# Patient Record
Sex: Female | Born: 1985
Health system: Southern US, Community
[De-identification: ages and names within clinical notes are randomized; demographics above are authoritative.]

## PROBLEM LIST (undated history)

## (undated) DIAGNOSIS — G4700 Insomnia, unspecified: Secondary | ICD-10-CM

## (undated) DIAGNOSIS — Z124 Encounter for screening for malignant neoplasm of cervix: Secondary | ICD-10-CM

## (undated) DIAGNOSIS — J40 Bronchitis, not specified as acute or chronic: Secondary | ICD-10-CM

## (undated) DIAGNOSIS — K603 Anal fistula, unspecified: Secondary | ICD-10-CM

## (undated) DIAGNOSIS — F338 Other recurrent depressive disorders: Secondary | ICD-10-CM

## (undated) DIAGNOSIS — L709 Acne, unspecified: Secondary | ICD-10-CM

## (undated) DIAGNOSIS — J029 Acute pharyngitis, unspecified: Secondary | ICD-10-CM

## (undated) DIAGNOSIS — E785 Hyperlipidemia, unspecified: Secondary | ICD-10-CM

## (undated) DIAGNOSIS — Z973 Presence of spectacles and contact lenses: Secondary | ICD-10-CM

## (undated) DIAGNOSIS — N760 Acute vaginitis: Secondary | ICD-10-CM

## (undated) DIAGNOSIS — D649 Anemia, unspecified: Principal | ICD-10-CM

## (undated) DIAGNOSIS — N946 Dysmenorrhea, unspecified: Secondary | ICD-10-CM

## (undated) DIAGNOSIS — C801 Malignant (primary) neoplasm, unspecified: Secondary | ICD-10-CM

## (undated) DIAGNOSIS — G43909 Migraine, unspecified, not intractable, without status migrainosus: Secondary | ICD-10-CM

## (undated) DIAGNOSIS — Z0000 Encounter for general adult medical examination without abnormal findings: Secondary | ICD-10-CM

## (undated) DIAGNOSIS — E669 Obesity, unspecified: Secondary | ICD-10-CM

## (undated) DIAGNOSIS — T7840XA Allergy, unspecified, initial encounter: Secondary | ICD-10-CM

## (undated) DIAGNOSIS — C539 Malignant neoplasm of cervix uteri, unspecified: Secondary | ICD-10-CM

## (undated) DIAGNOSIS — F988 Other specified behavioral and emotional disorders with onset usually occurring in childhood and adolescence: Secondary | ICD-10-CM

## (undated) DIAGNOSIS — R1032 Left lower quadrant pain: Secondary | ICD-10-CM

## (undated) HISTORY — DX: Other recurrent depressive disorders: F33.8

## (undated) HISTORY — DX: Encounter for general adult medical examination without abnormal findings: Z00.00

## (undated) HISTORY — DX: Allergy, unspecified, initial encounter: T78.40XA

## (undated) HISTORY — DX: Hyperlipidemia, unspecified: E78.5

## (undated) HISTORY — DX: Anemia, unspecified: D64.9

## (undated) HISTORY — DX: Malignant (primary) neoplasm, unspecified: C80.1

## (undated) HISTORY — DX: Bronchitis, not specified as acute or chronic: J40

## (undated) HISTORY — DX: Acute vaginitis: N76.0

## (undated) HISTORY — DX: Encounter for screening for malignant neoplasm of cervix: Z12.4

## (undated) HISTORY — DX: Left lower quadrant pain: R10.32

## (undated) HISTORY — DX: Acne, unspecified: L70.9

## (undated) HISTORY — DX: Dysmenorrhea, unspecified: N94.6

## (undated) HISTORY — DX: Malignant neoplasm of cervix uteri, unspecified: C53.9

## (undated) HISTORY — DX: Migraine, unspecified, not intractable, without status migrainosus: G43.909

## (undated) HISTORY — DX: Insomnia, unspecified: G47.00

## (undated) HISTORY — DX: Acute pharyngitis, unspecified: J02.9

## (undated) HISTORY — DX: Obesity, unspecified: E66.9

## (undated) HISTORY — DX: Other specified behavioral and emotional disorders with onset usually occurring in childhood and adolescence: F98.8

## (undated) HISTORY — PX: NO PAST SURGERIES: SHX2092

---

## 2010-06-27 ENCOUNTER — Ambulatory Visit: Payer: Self-pay | Admitting: Family Medicine

## 2010-06-27 DIAGNOSIS — M549 Dorsalgia, unspecified: Secondary | ICD-10-CM | POA: Insufficient documentation

## 2010-06-27 DIAGNOSIS — M4120 Other idiopathic scoliosis, site unspecified: Secondary | ICD-10-CM | POA: Insufficient documentation

## 2010-06-27 DIAGNOSIS — R41840 Attention and concentration deficit: Secondary | ICD-10-CM | POA: Insufficient documentation

## 2010-06-27 DIAGNOSIS — E669 Obesity, unspecified: Secondary | ICD-10-CM | POA: Insufficient documentation

## 2010-06-29 ENCOUNTER — Ambulatory Visit: Payer: Self-pay | Admitting: Psychology

## 2010-07-03 ENCOUNTER — Ambulatory Visit: Payer: Self-pay | Admitting: Family Medicine

## 2010-07-03 LAB — CONVERTED CEMR LAB
Bilirubin Urine: NEGATIVE
Blood in Urine, dipstick: NEGATIVE
Glucose, Urine, Semiquant: NEGATIVE
Ketones, urine, test strip: NEGATIVE
Nitrite: NEGATIVE
Protein, U semiquant: NEGATIVE
Specific Gravity, Urine: 1.02
Urobilinogen, UA: 0.2
WBC Urine, dipstick: NEGATIVE
pH: 8

## 2010-07-04 LAB — CONVERTED CEMR LAB
ALT: 14 units/L (ref 0–35)
AST: 18 units/L (ref 0–37)
Albumin: 4.2 g/dL (ref 3.5–5.2)
Alkaline Phosphatase: 54 units/L (ref 39–117)
BUN: 11 mg/dL (ref 6–23)
Basophils Absolute: 0 10*3/uL (ref 0.0–0.1)
Basophils Relative: 0.5 % (ref 0.0–3.0)
Bilirubin, Direct: 0.1 mg/dL (ref 0.0–0.3)
CO2: 23 meq/L (ref 19–32)
Calcium: 9.4 mg/dL (ref 8.4–10.5)
Chloride: 106 meq/L (ref 96–112)
Cholesterol: 177 mg/dL (ref 0–200)
Creatinine, Ser: 0.8 mg/dL (ref 0.4–1.2)
Eosinophils Absolute: 0.1 10*3/uL (ref 0.0–0.7)
Eosinophils Relative: 1.7 % (ref 0.0–5.0)
GFR calc non Af Amer: 101.69 mL/min (ref >60)
Glucose, Bld: 79 mg/dL (ref 70–99)
HCT: 36.4 % (ref 36.0–46.0)
HDL: 48.2 mg/dL (ref >39.00)
Hemoglobin: 12.5 g/dL (ref 12.0–15.0)
LDL Cholesterol: 111 mg/dL — ABNORMAL HIGH (ref 0–99)
Lymphocytes Relative: 31.2 % (ref 12.0–46.0)
Lymphs Abs: 2.5 10*3/uL (ref 0.7–4.0)
MCHC: 34.4 g/dL (ref 30.0–36.0)
MCV: 85.8 fL (ref 78.0–100.0)
Monocytes Absolute: 0.5 10*3/uL (ref 0.1–1.0)
Monocytes Relative: 6.6 % (ref 3.0–12.0)
Neutro Abs: 4.8 10*3/uL (ref 1.4–7.7)
Neutrophils Relative %: 60 % (ref 43.0–77.0)
Platelets: 268 10*3/uL (ref 150.0–400.0)
Potassium: 4.3 meq/L (ref 3.5–5.1)
RBC: 4.24 M/uL (ref 3.87–5.11)
RDW: 13.8 % (ref 11.5–14.6)
Sodium: 138 meq/L (ref 135–145)
TSH: 1.85 microintl units/mL (ref 0.35–5.50)
Total Bilirubin: 0.5 mg/dL (ref 0.3–1.2)
Total CHOL/HDL Ratio: 4
Total Protein: 6.7 g/dL (ref 6.0–8.3)
Triglycerides: 91 mg/dL (ref 0.0–149.0)
VLDL: 18.2 mg/dL (ref 0.0–40.0)
WBC: 8 10*3/uL (ref 4.5–10.5)

## 2010-07-10 ENCOUNTER — Telehealth (INDEPENDENT_AMBULATORY_CARE_PROVIDER_SITE_OTHER): Payer: Self-pay | Admitting: *Deleted

## 2010-07-11 ENCOUNTER — Ambulatory Visit: Payer: Self-pay | Admitting: Family Medicine

## 2010-07-11 DIAGNOSIS — E782 Mixed hyperlipidemia: Secondary | ICD-10-CM | POA: Insufficient documentation

## 2010-07-11 DIAGNOSIS — M279 Disease of jaws, unspecified: Secondary | ICD-10-CM | POA: Insufficient documentation

## 2010-07-11 DIAGNOSIS — K589 Irritable bowel syndrome without diarrhea: Secondary | ICD-10-CM | POA: Insufficient documentation

## 2010-07-16 ENCOUNTER — Telehealth (INDEPENDENT_AMBULATORY_CARE_PROVIDER_SITE_OTHER): Payer: Self-pay | Admitting: *Deleted

## 2010-07-17 ENCOUNTER — Ambulatory Visit: Payer: Self-pay | Admitting: Family Medicine

## 2010-08-02 ENCOUNTER — Ambulatory Visit: Payer: Self-pay | Admitting: Family Medicine

## 2010-08-07 ENCOUNTER — Telehealth: Payer: Self-pay | Admitting: Family Medicine

## 2010-08-21 ENCOUNTER — Ambulatory Visit: Payer: Self-pay | Admitting: Family Medicine

## 2010-08-21 DIAGNOSIS — M2140 Flat foot [pes planus] (acquired), unspecified foot: Secondary | ICD-10-CM | POA: Insufficient documentation

## 2010-09-18 ENCOUNTER — Ambulatory Visit: Payer: Self-pay | Admitting: Family Medicine

## 2010-09-18 DIAGNOSIS — L708 Other acne: Secondary | ICD-10-CM | POA: Insufficient documentation

## 2010-09-19 ENCOUNTER — Telehealth: Payer: Self-pay | Admitting: Family Medicine

## 2010-09-20 ENCOUNTER — Encounter: Payer: Self-pay | Admitting: Family Medicine

## 2010-10-19 ENCOUNTER — Ambulatory Visit
Admission: RE | Admit: 2010-10-19 | Discharge: 2010-10-19 | Payer: Self-pay | Source: Home / Self Care | Attending: Family Medicine | Admitting: Family Medicine

## 2010-10-31 ENCOUNTER — Encounter: Payer: Self-pay | Admitting: Family Medicine

## 2010-11-13 NOTE — Assessment & Plan Note (Signed)
Summary: 3 week fup//ccm   Vital Signs:  Patient profile:   25 year old female Menstrual status:  regular Height:      64.5 inches (163.83 cm) Weight:      173 pounds (78.64 kg) O2 Sat:      99 % on Room air Temp:     97.9 degrees F (36.61 degrees C) oral Pulse rate:   103 / minute BP sitting:   124 / 88  (left arm) Cuff size:   regular  Vitals Entered By: [name] RMA (August 21, 2010 10:20 AM)  O2 Flow:  Room air CC: 3 week follow up/ CF Is Patient Diabetic? No   History of Present Illness: patient is a 25 year old Caucasian female in today for followup on her ADD. She is noting that the increase in finance from 40 to 50 dailydid  help, she felt more focused for a longer period. however she still notes the medication wears off and the early to mid afternoon before she is able to get her final classes or to any particular home. She's not had any concerns with the medication no recurrent headaches. She has no complaints of chest pain, shortness of breath, GI or GU concerns and is considering post secondary education so she would like to achieve better control using medications at this time. She noted a slight change in her appetite downward but does continue to get hungry and eat regularly. She notes a long history of scoliosis over her entire spine  and is sturggling with increased pain recently  Current Medications (verified): 1)  Vyvanse 50 Mg Caps (Lisdexamfetamine Dimesylate) .... Once Daily 2)  Fish Oil 1000 Mg Caps (Omega-3 Fatty Acids) .Marland Kitchen.. 1 Cap By Mouth Daily  Allergies (verified): 1)  ! Sulfa  Past History:  Past medical history reviewed for relevance to current acute and chronic problems. Social history (including risk factors) reviewed for relevance to current acute and chronic problems.  Social History: Reviewed history from 06/27/2010 and no changes required. Single Never Smoked, very infrequent light habit in past Alcohol use-no Drug  use-no Occupation: taking pre Occupational Therapy classes Regular exercise-yes, yoga Seat Belt use is regular No dietary restrictions  Review of Systems      See HPI  Physical Exam  General:  Well-developed,well-nourished,in no acute distress; alert,appropriate and cooperative throughout examination Mouth:  Oral mucosa and oropharynx without lesions or exudates.  Teeth in good repair. Neck:  No deformities, masses, or tenderness noted. Lungs:  Normal respiratory effort, chest expands symmetrically. Lungs are clear to auscultation, no crackles or wheezes. Heart:  Normal rate and regular rhythm. S1 and S2 normal without gallop, murmur, click, rub or other extra sounds. Abdomen:  Bowel sounds positive,abdomen soft and non-tender without masses, organomegaly or hernias noted. Msk:  No deformity or scoliosis noted of thoracic or lumbar spine.   Extremities:  No clubbing, cyanosis, edema, or deformity noted with normal full range of motion of all joints.   Psych:  Cognition and judgment appear intact. Alert and cooperative with normal attention span and concentration. No apparent delusions, illusions, hallucinations   Impression & Recommendations:  Problem # 1:  FLAT FOOT (ICD-734)  Orders: Misc. Referral (Misc. Ref) Referred to podiatry for further intervention due to her fallen arches and persistent pain, and valgus deformity at ankles  Problem # 2:  BACK PAIN (ICD-724.5)  Orders: T-Thoracic Spine 2 Views [phone]) T-Lumbar Spine Complete, 5 Views (13244WN) T-Cervicle Spine 2-3 Views (02725DG) History of scoliosis noted  will consider PT vs ortho referral after images available  Problem # 3:  ATTENTION OR CONCENTRATION DEFICIT (ICD-799.51) Increase Vyvanse to 60mg  qam and  may use Ritalin 5-10 mg by mouth at bedtime as needed  Complete Medication List: 1)  Fish Oil 1000 Mg Caps (Omega-3 fatty acids) .Marland Kitchen.. 1 cap by mouth daily 2)  Ritalin 5 Mg Tabs (Methylphenidate hcl) .Marland Kitchen.. 1-2  tabs by mouth q pm as needed add 3)  Vyvanse 60 Mg Caps (Lisdexamfetamine dimesylate) .Marland Kitchen.. 1 tab by mouth daily  Patient Instructions: 1)  Please schedule a follow-up appointment in 1 month.  2)  Call with any conerns  3)  drink 64 oz of clear fluids daily 4)  Release of Records from [facility] Prescriptions: VYVANSE 60 MG CAPS (LISDEXAMFETAMINE DIMESYLATE) 1 tab by mouth daily  #30 x 0   Entered and Authorized by:   Danise Edge MD   Signed by:   Danise Edge MD on 08/21/2010   Method used:   Print then Give to Patient   RxID:   [id number] RITALIN 5 MG TABS (METHYLPHENIDATE HCL) 1-2 tabs by mouth q pm as needed add  #20 x 0   Entered and Authorized by:   Danise Edge MD   Signed by:   Danise Edge MD on 08/21/2010   Method used:   Print then Give to Patient   RxID:   [id number]    Orders Added: 1)  T-Thoracic Spine 2 Views [72070TC] 2)  T-Lumbar Spine Complete, 5 Views [71110TC] 3)  T-Cervicle Spine 2-3 Views [72040TC] 4)  Misc. Referral [Misc. Ref] 5)  Est. Patient Level IV [66440]

## 2010-11-13 NOTE — Progress Notes (Signed)
Summary: please advise  Phone Note Call from Patient Call back at Home Phone [phone]   Caller: Patient Call For: Danise Edge MD Summary of Call: pt was seen on 07-07-2010. pt is concern med not effective anymore (vyvanse). Pt has ov sch for 08-06-2010 should she come in sooner ? Initial call taken by: Heron Sabins,  July 16, 2010 10:00 AM  Follow-up for Phone Call        Yes have her come in sooner. We need to increase by 10 mg intervals so we have to see her and give her some of the 30 mg tabs Follow-up by: Danise Edge MD,  July 16, 2010 4:24 PM  Additional Follow-up for Phone Call Additional follow up Details #1::        Left message on pts vm to call back and make an appt. for sooner Additional Follow-up by: [name] RMA,  July 16, 2010 4:55 PM

## 2010-11-13 NOTE — Assessment & Plan Note (Signed)
Summary: MEDICATION CONCERNS // RS   Vital Signs:  Patient profile:   25 year old female Menstrual status:  regular Height:      64.5 inches (163.83 cm) Weight:      182 pounds (82.73 kg) O2 Sat:      99 % on Room air Temp:     98.1 degrees F (36.72 degrees C) oral Pulse rate:   89 / minute BP sitting:   126 / 90  (left arm) Cuff size:   regular  Vitals Entered By: [name] RMA (July 17, 2010 12:29 PM)  O2 Flow:  Room air  Serial Vital Signs/Assessments:  Time      Position  BP       Pulse  Resp  Temp     By                     112/84   78                    Danise Edge MD  CC: Medication concers/ CF Is Patient Diabetic? No   History of Present Illness: Patient in today for reevaluation of her ADD. She initially noted a relatively good response to the Vyvanse helping her study and stay focused in class. She notes her stress level/anxiety and repression all feel better as she has been able to concentrate better. Then over the past few days she has had more trouble concentrating again and the med is wearing off earlier in the day. She notes she had one HA, like HA she has had in past, it responded to Aleve and has not recurred. Denies CP/SOB/GI or GU c/o. Did have some mild sense of her heart beating harder but no tachy or skipped beats. She does acknowledge some recent caffeine intake too  Current Medications (verified): 1)  Vyvanse 20 Mg Caps (Lisdexamfetamine Dimesylate) .Marland Kitchen.. 1 Cap By Mouth Daily  Allergies (verified): 1)  ! Sulfa  Past History:  Past medical history reviewed for relevance to current acute and chronic problems. Social history (including risk factors) reviewed for relevance to current acute and chronic problems.  Social History: Reviewed history from 06/27/2010 and no changes required. Single Never Smoked, very infrequent light habit in past Alcohol use-no Drug use-no Occupation: taking pre Occupational Therapy classes Regular exercise-yes,  yoga Seat Belt use is regular No dietary restrictions  Review of Systems      See HPI  Physical Exam  General:  Well-developed,well-nourished,in no acute distress; alert,appropriate and cooperative throughout examination Head:  Normocephalic and atraumatic without obvious abnormalities. No apparent alopecia or balding. Mouth:  Oral mucosa and oropharynx without lesions or exudates.  Teeth in good repair. Neck:  No deformities, masses, or tenderness noted. Lungs:  Normal respiratory effort, chest expands symmetrically. Lungs are clear to auscultation, no crackles or wheezes. Heart:  Normal rate and regular rhythm. S1 and S2 normal without gallop, murmur, click, rub or other extra sounds. Abdomen:  Bowel sounds positive,abdomen soft and non-tender without masses, organomegaly or hernias noted. Extremities:  No clubbing, cyanosis, edema, or deformity noted with normal full range of motion of all joints.   Psych:  Cognition and judgment appear intact. Alert and cooperative with normal attention span and concentration. No apparent delusions, illusions, hallucinations   Impression & Recommendations:  Problem # 1:  ATTENTION OR CONCENTRATION DEFICIT (ICD-799.51) Increase Vyvanse to 30mg  q am and report any concerning symptoms or incomplete response for dose adjustment.  Complete  Medication List: 1)  Vyvanse 30 Mg Caps (Lisdexamfetamine dimesylate) .Marland Kitchen.. 1 cap by mouth q am  Patient Instructions: 1)  Please schedule a follow-up appointment in 2 weeks. Or sooner if any concerns Prescriptions: VYVANSE 30 MG CAPS (LISDEXAMFETAMINE DIMESYLATE) 1 cap by mouth q am  #30 x 0   Entered and Authorized by:   Danise Edge MD   Signed by:   Danise Edge MD on 07/17/2010   Method used:   Print then Give to Patient   RxID:   [id number] VYVANSE 30 MG CAPS (LISDEXAMFETAMINE DIMESYLATE) 1 cap by mouth q am  #15 x 0   Entered and Authorized by:   Danise Edge MD   Signed by:   Danise Edge MD on  07/17/2010   Method used:   Print then Give to Patient   RxID:   [id number]

## 2010-11-13 NOTE — Assessment & Plan Note (Signed)
Summary: 1 month fup//ccm/pt rsc/cjr   Vital Signs:  Patient profile:   25 year old female Menstrual status:  regular Height:      64.5 inches (163.83 cm) Weight:      180 pounds (81.82 kg) O2 Sat:      99 % on Room air Temp:     98.0 degrees F (36.67 degrees C) oral Pulse rate:   80 / minute BP sitting:   122 / 82  (left arm) Cuff size:   regular  Vitals Entered By: [name] RMA (August 02, 2010 8:46 AM)  O2 Flow:  Room air CC: 1 month follow up on medication/ CF Is Patient Diabetic? No   History of Present Illness: Patient in today for reevaluation of her ADD. She is doing well and feels the Vyvanse dose increase helped somewhat but is still wearing off too early in the day. She is concentrating better but not as fully as she would like. She does acknowledge a bit more irritability on the med but very manageable. No CP/palp/SOB/GI or GU concerns. Did have a few HA but did get a mouthguard to use at bedtime a couple days ago and that has helped the HA tremendously. No HA this am  Preventive Screening-Counseling & Management  Alcohol-Tobacco     Smoking Status: never  Current Medications (verified): 1)  Vyvanse 30 Mg Caps (Lisdexamfetamine Dimesylate) .Marland Kitchen.. 1 Cap By Mouth Q Am  Allergies (verified): 1)  ! Sulfa  Past History:  Past medical history reviewed for relevance to current acute and chronic problems. Social history (including risk factors) reviewed for relevance to current acute and chronic problems.  Social History: Reviewed history from 06/27/2010 and no changes required. Single Never Smoked, very infrequent light habit in past Alcohol use-no Drug use-no Occupation: taking pre Occupational Therapy classes Regular exercise-yes, yoga Seat Belt use is regular No dietary restrictions  Review of Systems      See HPI  Physical Exam  General:  Well-developed,well-nourished,in no acute distress; alert,appropriate and cooperative throughout  examination Head:  Normocephalic and atraumatic without obvious abnormalities. No apparent alopecia or balding. Mouth:  Oral mucosa and oropharynx without lesions or exudates.  Teeth in good repair. Neck:  No deformities, masses, or tenderness noted. Lungs:  Normal respiratory effort, chest expands symmetrically. Lungs are clear to auscultation, no crackles or wheezes. Heart:  Normal rate and regular rhythm. S1 and S2 normal without gallop, murmur, click, rub or other extra sounds. Abdomen:  Bowel sounds positive,abdomen soft and non-tender without masses, organomegaly or hernias noted. Psych:  Cognition and judgment appear intact. Alert and cooperative with normal attention span and concentration. No apparent delusions, illusions, hallucinations   Impression & Recommendations:  Problem # 1:  ATTENTION OR CONCENTRATION DEFICIT (ICD-799.51) Will have her increase her Vyvanse to 40mg  qam by taking 2 of the 20mg  tabs daily until early next week, if incomplete response will increase to 50mg , if good response will write a script for 40mg . Call with any concerns  Problem # 2:  MIXED HYPERLIPIDEMIA (ICD-272.2) Has started fish oil caps daily  Complete Medication List: 1)  Vyvanse 30 Mg Caps (Lisdexamfetamine dimesylate) .Marland Kitchen.. 1 cap by mouth q am 2)  Fish Oil 1000 Mg Caps (Omega-3 fatty acids) .Marland Kitchen.. 1 cap by mouth daily  Patient Instructions: 1)  Please schedule a follow-up appointment in 2 to3 weeks.  2)  Take 2 of the 20mg  Vyvanse daily for next 5 days then call office to report if there was  an adequate response or not. So we can adjust dosing.  3)  Call with any concerns.   Orders Added: 1)  Est. Patient Level III [30865]

## 2010-11-13 NOTE — Assessment & Plan Note (Signed)
Summary: 2 wk rov/njr   Vital Signs:  Patient profile:   25 year old female Menstrual status:  regular Height:      64.5 inches (163.83 cm) Weight:      184 pounds (83.64 kg) O2 Sat:      98 % on Room air Temp:     98.5 degrees F (36.94 degrees C) oral Pulse rate:   89 / minute BP sitting:   108 / 78  (left arm) Cuff size:   regular  Vitals Entered By: [name] RMA (July 11, 2010 8:54 AM)  O2 Flow:  Room air CC: 2 week follow up/ CF Is Patient Diabetic? No   History of Present Illness: Patient in today for reevaluation of her Attention Deficit issues. Seen by Dr Dellia Cloud and evaluated for ADD, Anxiety and Depression. Testing did confirm ADD and she would like to start meds. She continues to struggle in school with difficulty concentrating and did very poorly on a biology test recently. She had a long discussion with Dr Dellia Cloud about her anxiety and low mood and they decided they may be related to her ADD and difficulty at school. She would like to treat one and see how the others do. She is also noting she is grinding her teeth at night and waking up with discomfort in her bilateral temporal mandibular joint. She denies any dental pain and has not seen a dentist recently. She is having some irritable bowel symptoms wavering from some mild constipation for some mild loose stool at times. She denies fevers, chills, nausea, vomiting, anorexia, bloody or tarry stool. Denies any recent illness, chest pain, palpitations, shortness or breath, headaches  Current Medications (verified): 1)  None  Allergies (verified): 1)  ! Sulfa  Past History:  Past medical history reviewed for relevance to current acute and chronic problems. Social history (including risk factors) reviewed for relevance to current acute and chronic problems.  Social History: Reviewed history from 06/27/2010 and no changes required. Single Never Smoked, very infrequent light habit in past Alcohol  use-no Drug use-no Occupation: taking pre Occupational Therapy classes Regular exercise-yes, yoga Seat Belt use is regular No dietary restrictions  Review of Systems      See HPI  Physical Exam  General:  Well-developed,well-nourished,in no acute distress; alert,appropriate and cooperative throughout examination Head:  Normocephalic and atraumatic without obvious abnormalities. No apparent alopecia or balding. Mouth:  Oral mucosa and oropharynx without lesions or exudates.  Teeth in good repair. Neck:  No deformities, masses, or tenderness noted. Lungs:  Normal respiratory effort, chest expands symmetrically. Lungs are clear to auscultation, no crackles or wheezes. Heart:  Normal rate and regular rhythm. S1 and S2 normal without gallop, murmur, click, rub or other extra sounds. Abdomen:  Bowel sounds positive,abdomen soft and non-tender without masses, organomegaly or hernias noted. Extremities:  No clubbing, cyanosis, edema, or deformity noted  Cervical Nodes:  No lymphadenopathy noted Psych:  Cognition and judgment appear intact. Alert and cooperative with normal attention span and concentration. No apparent delusions, illusions, hallucinations   Impression & Recommendations:  Problem # 1:  ATTENTION OR CONCENTRATION DEFICIT (ICD-799.51) Vyvanse 20mg  by mouth once daily is given, warned regarding side effects will call if any concerns, will reevaluate in 3 weeks  Problem # 2:  BACK PAIN (ICD-724.5) Encouraged increased exercise and decrease by mouth intake  Problem # 3:  MIXED HYPERLIPIDEMIA (ICD-272.2) Mild elevation in LDL cholesterol encouraged avoid trans fats, minimize saturated fats, add Fiber supplement and increae  exercise. Repeat FLP in 1 year  Problem # 4:  IBS (ICD-564.1) Consider a yogurt with fiber supplement daily and report worsening symptoms  Problem # 5:  JAW PAIN (ICD-526.9) Likely related to grinding her teeth at bedtime due to stress, if persists recommend  mouth guard and dental evaluation. Use Aspercreme as needed over external TMJ for pain relief  Complete Medication List: 1)  Vyvanse 20 Mg Caps (Lisdexamfetamine dimesylate) .Marland Kitchen.. 1 cap by mouth daily  Patient Instructions: 1)  Please schedule a follow-up appointment in 3 weeks.  2)  Avoid trans fats, minimize saturated fats, increase exercise 3)  Report any concerns regarding new meds prior to visit 4)  Release of Record recent evaluation and visit with Dr Dellia Cloud Prescriptions: VYVANSE 20 MG CAPS (LISDEXAMFETAMINE DIMESYLATE) 1 cap by mouth daily  #30 x 0   Entered and Authorized by:   Danise Edge MD   Signed by:   Danise Edge MD on 07/11/2010   Method used:   Print then Give to Patient   RxID:   [id number]

## 2010-11-13 NOTE — Letter (Signed)
Summary: Medical Review Letter  Letter   Imported By: Georga Bora 09/20/2010 08:29:31  _____________________________________________________________________  External Attachment:    Type:   Image     Comment:   External Document  Appended Document: Medical Review Letter letter faxed 12.7.11

## 2010-11-13 NOTE — Assessment & Plan Note (Signed)
Summary: NEW PT TO ESTABLISH//SLM   Vital Signs:  Patient profile:   25 year old female Menstrual status:  regular LMP:     06/20/2010 Height:      64.5 inches (163.83 cm) Weight:      181 pounds (82.27 kg) BMI:     30.70 O2 Sat:      98 % on Room air Temp:     98.6 degrees F (37.00 degrees C) oral Pulse (ortho):   76 / minute BP sitting:   130 / 90  (left arm) Cuff size:   regular  Vitals Entered By: [name] RMA (June 27, 2010 2:33 PM)  O2 Flow:  Room air CC: Establish new patient/ Possibility of ADD/ Hep A vaccine?/CF Is Patient Diabetic? No LMP (date): 06/20/2010     Menstrual Status regular Enter LMP: 06/20/2010   History of Present Illness: Patient is in for new patient examination. 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 She's had no recent acute illness but needs to establish care and discuss immunizations. She is concerned about what she believes is a history of a ADD. She had difficulty getting focused, she notes she's always had trouble finishing tasks both at home and at school. She finds when she sits up she fidgets frequently and has trouble concentrating. Has always taken longer to finish assignments that she felt was appropriate. In examining her family history she believes her father also start now and is presently still having trouble caring she is requesting evaluation to confirm the diagnosis. No fevers, chills, chest pain, palpitations, shortness of breath, GI or GU complaints at this time  Preventive Screening-Counseling & Management  Alcohol-Tobacco     Smoking Status: never  Caffeine-Diet-Exercise     Does Patient Exercise: yes      Drug Use:  no.    Current Medications (verified): 1)  None  Allergies (verified): 1)  ! Sulfa  Past History:  Past Surgical History: Denies surgical history  Family History: Father: 26, allergies, asthma, renal lithiasis, knee  injuries, depression/anxiety, IBS Mother: 57, A&W, hyperlipidemia Siblings:  Sister: 13, anxiety/depression, Migraine HA, GI upset/IBS Brother: 50: depression' allergies Sister: 13, anemia, fatigue MGM: 73, derpession/anxiety, ADD, HTN MGF: deceased, step recently genetic MGF died young unclear causes PGM: deceased@59 , cardiomyopathy, depression PGF: 53, prostate disease, CAD Children:None  Social History: Single Never Smoked, very infrequent light habit in past Alcohol use-no Drug use-no Occupation: taking pre Occupational Therapy classes Regular exercise-yes, yoga Seat Belt use is regular No dietary restrictions Smoking Status:  never Drug Use:  no Occupation:  employed Does Patient Exercise:  yes  Review of Systems       The patient complains of depression.  The patient denies anorexia, fever, weight loss, weight gain, vision loss, decreased hearing, hoarseness, chest pain, syncope, dyspnea on exertion, peripheral edema, prolonged cough, headaches, hemoptysis, abdominal pain, melena, hematochezia, severe indigestion/heartburn, hematuria, incontinence, muscle weakness, suspicious skin lesions, transient blindness, difficulty walking, unusual weight change, abnormal bleeding, and enlarged lymph nodes.         Flu Vaccine Consent Questions     Do you have a history of severe allergic reactions to this vaccine? no    Any prior history of allergic reactions to egg and/or gelatin? no    Do you have a sensitivity to the preservative Thimersol? no    Do you have a past history of Guillan-Barre Syndrome? no    Do you currently have  an acute febrile illness? no    Have you ever had a severe reaction to latex? no    Vaccine information given and explained to patient? yes    Are you currently pregnant? no    Lot Number:[id number]   Exp Date:04/13/2011   Site Given  Left Deltoid IM [name] RMA  June 27, 2010 3:49 PM   Physical Exam  General:   Well-developed,well-nourished,in no acute distress; alert,appropriate and cooperative throughout examination Head:  Normocephalic and atraumatic without obvious abnormalities. No apparent alopecia or balding. Eyes:  No corneal or conjunctival inflammation noted. EOMI.  Ears:  External ear exam shows no significant lesions or deformities.  Otoscopic examination reveals clear canals, tympanic membranes are intact bilaterally without bulging, retraction, inflammation or discharge. Hearing is grossly normal bilaterally. Nose:  External nasal examination shows no deformity or inflammation. Nasal mucosa are pink and moist without lesions or exudates. Mouth:  Oral mucosa and oropharynx without lesions or exudates.  Teeth in good repair. Neck:  No deformities, masses, or tenderness noted. Lungs:  Normal respiratory effort, chest expands symmetrically. Lungs are clear to auscultation, no crackles or wheezes. Heart:  Normal rate and regular rhythm. S1 and S2 normal without gallop, murmur, click, rub or other extra sounds. Abdomen:  Bowel sounds positive,abdomen soft and non-tender without masses, organomegaly or hernias noted. Msk:  No deformity or scoliosis noted of thoracic or lumbar spine.   Pulses:  R and L carotid,radial,femoral,dorsalis pedis and posterior tibial pulses are full and equal bilaterally Extremities:  No clubbing, cyanosis, edema, or deformity noted with normal full range of motion of all joints.   Neurologic:  No cranial nerve deficits noted. Station and gait are normal. Plantar reflexes are down-going bilaterally. DTRs are symmetrical throughout. Sensory, motor and coordinative functions appear intact. Skin:  Intact without suspicious lesions or rashes Cervical Nodes:  No lymphadenopathy noted Psych:  Cognition and judgment appear intact. Alert and cooperative with normal attention span and concentration. No apparent delusions, illusions, hallucinations   Impression &  Recommendations:  Problem # 1:  BACK PAIN (ICD-724.5) Mild may use Aleve as needed, maintain good posture and exercise  Problem # 2:  OVERWEIGHT (ICD-278.02) Check fasting labs  Problem # 3:  Preventive Health Care (ICD-V70.0) Discussed immunizations, will consider Gardisil and Hep A shots. Agrees to Flu shot  Problem # 4:  ATTENTION OR CONCENTRATION DEFICIT (ICD-799.51) Will refer for evaluation and then initiate meds if appropriate  Other Orders: Admin 1st Vaccine ([address]) Flu Vaccine 56yrs + ([address])  Patient Instructions: 1)  Please schedule a follow-up appointment in 2 weeks.  2)  BMP prior to visit, ICD-9: v70.0 for all 3)  Hepatic Panel prior to visit ICD-9:  4)  Lipid panel prior to visit ICD-9 :  5)  TSH prior to visit ICD-9 :  6)  CBC w/ Diff prior to visit ICD-9 :   Preventive Care Screening  PPD:    Date:  06/14/2010    Results:  historical   Last Tetanus Booster:    Date:  04/13/2010    Results:  Historical

## 2010-11-13 NOTE — Assessment & Plan Note (Signed)
Summary: fu visit questions about meds/dt   Vital Signs:  Patient profile:   25 year old female Menstrual status:  regular Weight:      170.50 pounds O2 Sat:      100 % on Room air Temp:     98.5 degrees F oral Pulse rate:   76 / minute Resp:     76 per minute BP sitting:   126 / 87  (right arm)  O2 Flow:  Room air   History of Present Illness: Patient is here today for reevaluation of ADD. She reports she has had a good response to the increased Vyvanse but it still does not last long enough. When she takes it she is able to focus well but it wears off still in 3-4 hours. She does not experience any CP/palp/anorexia. She tried the Ritalin short acting in the afternoon but she feels that actually kept her up at night.  She is also having ongoing difficulty with facial acne and would like to see a Dermatologist to have it further evaluated. She has tried numberous OTC treatments in the past without benefit. Did not go to have her xrays due to difficulties with school. Her low back pain is present and tolerable and she will go for the xrays later. No incontinence/GI or GU c/o. She has not been able to proceed with Podiatry due to her insurance as well. Her feet still continue to bother her. No recent illness/f/c/malaise.  Allergies: 1)  ! Sulfa  Past History:  Past medical history reviewed for relevance to current acute and chronic problems. Social history (including risk factors) reviewed for relevance to current acute and chronic problems.  Social History: Reviewed history from 06/27/2010 and no changes required. Single Never Smoked, very infrequent light habit in past Alcohol use-no Drug use-no Occupation: taking pre Occupational Therapy classes Regular exercise-yes, yoga Seat Belt use is regular No dietary restrictions  Review of Systems      See HPI  Physical Exam  General:  Well-developed,well-nourished,in no acute distress; alert,appropriate and cooperative  throughout examination Head:  Normocephalic and atraumatic without obvious abnormalities. No apparent alopecia or balding. Mouth:  Oral mucosa and oropharynx without lesions or exudates.  Teeth in good repair. Lungs:  Normal respiratory effort, chest expands symmetrically. Lungs are clear to auscultation, no crackles or wheezes. Heart:  Normal rate and regular rhythm. S1 and S2 normal without gallop, murmur, click, rub or other extra sounds. Abdomen:  Bowel sounds positive,abdomen soft and non-tender without masses, organomegaly or hernias noted. Extremities:  No clubbing, cyanosis, edema, or deformity noted    Cervical Nodes:  No lymphadenopathy noted Psych:  Cognition and judgment appear intact. Alert and cooperative with normal attention span and concentration. No apparent delusions, illusions, hallucinations   Impression & Recommendations:  Problem # 1:  ATTENTION OR CONCENTRATION DEFICIT (ICD-799.51) Will try to split the Vyvanse dosing to 40mg  in am and 30mg  q noon, patient will call if inadequate response or any other concerns  Problem # 2:  FLAT FOOT (ICD-734) Patient is going to check with her insurance to see what they are willing to cover and let us know if she wants to proceed with referral. Encouraged her to try some OTC Dr Margart Sickles inserts in the mean time  Problem # 3:  BACK PAIN (ICD-724.5) Improved somewhat, may proceed with xrays later. Encouraged her to remain active and stretch daily  Problem # 4:  ACNE VULGARIS (ICD-706.1) Would like referral to Dermatology, willl call her  insurance to figure out who she would like a referral to.  Complete Medication List: 1)  Fish Oil 1000 Mg Caps (Omega-3 fatty acids) .Marland Kitchen.. 1 cap by mouth daily 2)  Vyvanse 40 Mg Caps (Lisdexamfetamine dimesylate) .Marland Kitchen.. 1 cap by mouth qam 3)  Vyvanse 30 Mg Caps (Lisdexamfetamine dimesylate) .Marland Kitchen.. 1 cap by mouth daily at noon  Patient Instructions: 1)  Please schedule a follow-up appointment in 1  month.  2)  Call with name of Dermatologist and Podiatrist your insurance approves of Prescriptions: VYVANSE 30 MG CAPS (LISDEXAMFETAMINE DIMESYLATE) 1 cap by mouth daily at noon  #30 x 0   Entered and Authorized by:   Danise Edge MD   Signed by:   Danise Edge MD on 09/18/2010   Method used:   Print then Give to Patient   RxID:   [id number] VYVANSE 40 MG CAPS (LISDEXAMFETAMINE DIMESYLATE) 1 cap by mouth qam  #30 x 0   Entered and Authorized by:   Danise Edge MD   Signed by:   Danise Edge MD on 09/18/2010   Method used:   Print then Give to Patient   RxID:   [id number]    Orders Added: 1)  Est. Patient Level IV [84696]

## 2010-11-13 NOTE — Progress Notes (Signed)
Summary: FYI  Phone Note Call from Patient   Summary of Call: Patient has called several times wanting to know if we have received information from Dr Levie Heritage office? Patient would like to get started on her RX that he discussed with her?  I left a message on pts vm stating we still have not received anything from that Drs office. Initial call taken by: [name] RMA,  July 10, 2010 8:44 AM

## 2010-11-13 NOTE — Progress Notes (Signed)
Summary: Vyvanse increase  Phone Note Call from Patient   Summary of Call: Pt called and stated she increased the Vyvanse to 1 tab of 20mg  two times a day for two days and felt better. Pt states she took one at 9 am yesterday and by 2:30 she felt as if she has never taken anything. Pt would like to know if she should go to 50mg  daily? Initial call taken by: [name] RMA,  August 07, 2010 10:41 AM  Follow-up for Phone Call        Vyvanse 50mg  by mouth qam, give her 30#, no refills Follow-up by: Danise Edge MD,  August 07, 2010 4:43 PM    New/Updated Medications: VYVANSE 50 MG CAPS (LISDEXAMFETAMINE DIMESYLATE) once daily Prescriptions: VYVANSE 50 MG CAPS (LISDEXAMFETAMINE DIMESYLATE) once daily  #30 x 0   Entered by:   [name] RMA   Authorized by:   Danise Edge MD   Signed by:   [name] RMA on 08/07/2010   Method used:   Telephoned to ...       [id number] (retail)       [address]       [place], [place]  [address]       Ph: [phone] or [phone]       Fax: [phone]   RxID:   [id number]   Appended Document: Vyvanse increase RX has to be picked up. Pharmacy and patient informed.   Clinical Lists Changes  Medications: Rx of VYVANSE 50 MG CAPS (LISDEXAMFETAMINE DIMESYLATE) once daily;  #30 x 0;  Signed;  Entered by: [name] RMA;  Authorized by: Danise Edge MD;  Method used: Print then Give to Patient    Prescriptions: VYVANSE 50 MG CAPS (LISDEXAMFETAMINE DIMESYLATE) once daily  #30 x 0   Entered by:   [name] RMA   Authorized by:   Danise Edge MD   Signed by:   [name] RMA on 08/07/2010   Method used:   Print then Give to Patient   RxID:   [id number]

## 2010-11-15 ENCOUNTER — Encounter: Payer: Self-pay | Admitting: Family Medicine

## 2010-11-15 NOTE — Assessment & Plan Note (Signed)
Summary: 48month f/u/vfw   Vital Signs:  Patient profile:   25 year old female Menstrual status:  regular Height:      64.5 inches (163.83 cm) Weight:      168.50 pounds (76.59 kg) O2 Sat:      99 % on Room air Temp:     97.6 degrees F (36.44 degrees C) oral Pulse rate:   79 / minute BP sitting:   118 / 81  (right arm) Cuff size:   regular  Vitals Entered By: [name] RMA (October 19, 2010 11:42 AM)  O2 Flow:  Room air CC: 1 month follow up/ pt needs refill on Vyvanse/  CF Is Patient Diabetic? No   History of Present Illness: Patient is a 25 yo Caucasian female in today for reevaluation of ADD and multiple other medical problems. she is pleased with her response to the split dosing of bypass. She feels she has less side effects on the split dosing and has 5 she can concentrate for longer throughout the day. She takes 40 mg in the morning and 30 mg midday she does note on occasion she is brought to take Advil 2 in the afternoon and then she does experience some insomnia but in general if she takes the dosing in the morning and midday she does not sure what the side effect. She denies any headaches, chest pain, palpitations, shortness or breath or other concerning symptoms. But her bowels are presently moving better she. She has increased the fiber and fluids in her diet without caffeine and feels that has helped her out quite a bit. She continues to struggle with chronic back pain most notably in the thoracic spine but also up in the neck and some radicular symptoms in the right upper extremity are noted at times. She notes with some weight loss and increased exercise the pain tends to improve but at present has been bothering her. Her feet continue to be somewhat painful but she has not chosen to see orthopedics or podiatry at the match her pain as the pain is tolerable. She has yet to set up her appointment with allergy for her acne but notes the symptoms are stable and not worsening.  No fevers, chills, febrile or recent illness.  Current Medications (verified): 1)  Fish Oil 1000 Mg Caps (Omega-3 Fatty Acids) .Marland Kitchen.. 1 Cap By Mouth Daily 2)  Vyvanse 40 Mg Caps (Lisdexamfetamine Dimesylate) .Marland Kitchen.. 1 Cap By Mouth Qam 3)  Vyvanse 30 Mg Caps (Lisdexamfetamine Dimesylate) .Marland Kitchen.. 1 Cap By Mouth Daily At Noon  Allergies (verified): 1)  ! Sulfa  Past History:  Past medical history reviewed for relevance to current acute and chronic problems. Social history (including risk factors) reviewed for relevance to current acute and chronic problems.  Social History: Reviewed history from 06/27/2010 and no changes required. Single Never Smoked, very infrequent light habit in past Alcohol use-no Drug use-no Occupation: taking pre Occupational Therapy classes Regular exercise-yes, yoga Seat Belt use is regular No dietary restrictions  Review of Systems      See HPI  Physical Exam  General:  Well-developed,well-nourished,in no acute distress; alert,appropriate and cooperative throughout examination Head:  Normocephalic and atraumatic without obvious abnormalities. No apparent alopecia or balding. Mouth:  Oral mucosa and oropharynx without lesions or exudates.  Teeth in good repair. Lungs:  Normal respiratory effort, chest expands symmetrically. Lungs are clear to auscultation, no crackles or wheezes. Heart:  Normal rate and regular rhythm. S1 and S2 normal without gallop,  murmur, click, rub or other extra sounds. Abdomen:  Bowel sounds positive,abdomen soft and non-tender without masses, organomegaly or hernias noted. Extremities:  No clubbing, cyanosis, edema, or deformity noted with normal full range of motion of all joints.   Psych:  Cognition and judgment appear intact. Alert and cooperative with normal attention span and concentration. No apparent delusions, illusions, hallucinations   Impression & Recommendations:  Problem # 1:  ATTENTION OR CONCENTRATION DEFICIT  (ICD-799.51) Symptoms greatly improved on split dosing of Vyvanse. No concerning side effects will cont 40mg  in am and 30 mg in midday. Patient will call with any concerns when she returns back to college next week.  Problem # 2:  ACNE VULGARIS (ICD-706.1) Patient has not proceeded with dermatology to date but does believe she will go in the near future. In the interim use Cetaphil soap and Witch Hazel Astringent daily  Problem # 3:  IBS (ICD-564.1) Symptoms improved some with increased fiber and fluid in diet, has cut out caffeine as well. Moving bowels with regularity now.  Problem # 4:  FLAT FOOT (ICD-734) No increased pain at present but she still believes she will seek evaluation from a podiatrist in the future. Consider otc inserts in the interim  Problem # 5:  BACK PAIN (ICD-724.5)  Orders: T-Cervicle Spine 2-3 Views (23762GB) T-Lumbar Spine 2 Views (72100TC) T-Thoracic Spine 2 Views ([phone]) Did not go for the xrays last time we ordered them and continues to have some pain so will try an go today. Encouraged moist heat and gentle stretching of neck and shoulders daily as instructed  Complete Medication List: 1)  Fish Oil 1000 Mg Caps (Omega-3 fatty acids) .Marland Kitchen.. 1 cap by mouth daily 2)  Vyvanse 40 Mg Caps (Lisdexamfetamine dimesylate) .Marland Kitchen.. 1 cap by mouth qam 3)  Vyvanse 30 Mg Caps (Lisdexamfetamine dimesylate) .Marland Kitchen.. 1 cap by mouth daily at noon  Patient Instructions: 1)  Please schedule a follow-up appointment in 2 months.  2)  Call if any concerns 3)  Moist heat and gentle stretching of neck daily Prescriptions: VYVANSE 30 MG CAPS (LISDEXAMFETAMINE DIMESYLATE) 1 cap by mouth daily at noon  #30 x 0   Entered and Authorized by:   Danise Edge MD   Signed by:   Danise Edge MD on 10/19/2010   Method used:   Print then Give to Patient   RxID:   [id number] VYVANSE 40 MG CAPS (LISDEXAMFETAMINE DIMESYLATE) 1 cap by mouth qam  #30 x 0   Entered and Authorized by:   Danise Edge MD   Signed by:   Danise Edge MD on 10/19/2010   Method used:   Print then Give to Patient   RxID:   [id number]    Orders Added: 1)  T-Cervicle Spine 2-3 Views [72040TC] 2)  T-Lumbar Spine 2 Views [72100TC] 3)  T-Thoracic Spine 2 Views [72070TC] 4)  Est. Patient Level IV [16967]

## 2010-11-15 NOTE — Progress Notes (Signed)
  Phone Note Call from Patient   Caller: Patient Call For: Danise Edge MD Summary of Call: The insurance company needs a  letter of explanation for the two different strengths of meds the patient  has to take . The letter is for Medical Review I.D number [id number]. Fax # [phone] or [phone]. You may call phone # [phone] or [phone]. Initial call taken by: Georga Bora,  September 19, 2010 2:00 PM  Follow-up for Phone Call        Letter completed, please fax Follow-up by: Danise Edge MD,  September 19, 2010 3:32 PM  Additional Follow-up for Phone Call Additional follow up Details #1::        Letter faxed December 7,2011.  Additional Follow-up by: Georga Bora,  September 24, 2010 8:30 AM

## 2010-11-26 ENCOUNTER — Telehealth: Payer: Self-pay | Admitting: Family Medicine

## 2010-11-27 ENCOUNTER — Encounter: Payer: Self-pay | Admitting: Family Medicine

## 2010-11-27 ENCOUNTER — Ambulatory Visit (INDEPENDENT_AMBULATORY_CARE_PROVIDER_SITE_OTHER): Payer: Commercial Indemnity | Admitting: Family Medicine

## 2010-11-27 DIAGNOSIS — J209 Acute bronchitis, unspecified: Secondary | ICD-10-CM | POA: Insufficient documentation

## 2010-11-27 DIAGNOSIS — R41840 Attention and concentration deficit: Secondary | ICD-10-CM

## 2010-11-28 ENCOUNTER — Encounter: Payer: Self-pay | Admitting: Family Medicine

## 2010-12-05 ENCOUNTER — Encounter: Payer: Self-pay | Admitting: Family Medicine

## 2010-12-05 ENCOUNTER — Telehealth (INDEPENDENT_AMBULATORY_CARE_PROVIDER_SITE_OTHER): Payer: Self-pay | Admitting: *Deleted

## 2010-12-05 NOTE — Assessment & Plan Note (Signed)
Summary: congestion-cough/dt   Vital Signs:  Patient profile:   25 year old female Menstrual status:  regular Height:      64.5 inches Weight:      159 pounds Temp:     98.2 degrees F oral BP sitting:   112 / 76  (right arm) Cuff size:   regular  Vitals Entered By: Francee Piccolo CMA Duncan Dull) (November 27, 2010 3:27 PM) CC: congestion, ? fever yesterday...SP Is Patient Diabetic? No   History of Present Illness: patient is a 25 year old Caucasian female in today for evaluation of worsening congestion. Patient notes that for several weeks now she has been feeling somewhat congested and increasingly tired. She reports trying to get to sleep he had transient felt she was doing okay until about 3 days ago. 3 days ago she had significant increase in her nasal congestion with yellowish, rhinorrhea, malaise, fatigue, myalgias, anorexia, fevers, chills, cough, sore throat, ear pain and mild chest congestion. 5 days ago she had onset of increase in loose stool as well. Denies any bloody or tarry stool but has had some nausea. Significant amount of postnasal drip but denies any obvious wheezing or shortness of breath. She has tried NyQuil and DayQuil without any improvement. She noticed that eating and drinking well and is struggling with some dehydration as well. Mild headache as noted. Denies any urinary symptoms such as dysuria or decreased urinary flow.  Current Medications (verified): 1)  Fish Oil 1000 Mg Caps (Omega-3 Fatty Acids) .Marland Kitchen.. 1 Cap By Mouth Daily 2)  Vyvanse 40 Mg Caps (Lisdexamfetamine Dimesylate) .Marland Kitchen.. 1 Cap By Mouth Qam 3)  Vyvanse 30 Mg Caps (Lisdexamfetamine Dimesylate) .Marland Kitchen.. 1 Cap By Mouth Daily At Noon  Allergies (verified): 1)  ! Sulfa  Past History:  Past medical history reviewed for relevance to current acute and chronic problems. Social history (including risk factors) reviewed for relevance to current acute and chronic problems.  Social History: Reviewed history  from 06/27/2010 and no changes required. Single Never Smoked, very infrequent light habit in past Alcohol use-no Drug use-no Occupation: taking pre Occupational Therapy classes Regular exercise-yes, yoga Seat Belt use is regular No dietary restrictions  Review of Systems      See HPI  Physical Exam  General:  Well-developed,well-nourished,in no acute distress; alert,appropriate and cooperative throughout examination Head:  Normocephalic and atraumatic without obvious abnormalities. No apparent alopecia or balding. Ears:  TMs dull and retracted b/l, no erythema Nose:  mucosal erythema and mucosal edema.  Mucosa friable in left nares Mouth:  Oral mucosa and oropharynx without lesions or exudates.  Teeth in good repair.pharyngeal erythema.   Neck:  No deformities, masses, or tenderness noted. Lungs:  R wheezes, expiratory, left side clear Heart:  Normal rate and regular rhythm. S1 and S2 normal without gallop, murmur, click, rub or other extra sounds. Abdomen:  Bowel sounds positive,abdomen soft and non-tender without masses, organomegaly or hernias noted. Extremities:  No clubbing, cyanosis, edema, or deformity noted with normal full range of motion of all joints.   Cervical Nodes:  R anterior LN enlarged and L anterior LN enlarged.   Psych:  Cognition and judgment appear intact. Alert and cooperative with normal attention span and concentration. No apparent delusions, illusions, hallucinations   Impression & Recommendations:  Problem # 1:  ACUTE BRONCHITIS (ICD-466.0)  Her updated medication list for this problem includes:    Cefdinir 300 Mg Caps (Cefdinir) .Marland Kitchen... 1 cap by mouth two times a day x 14 days  Mucinex 600 Mg Xr12h-tab (Guaifenesin) .Marland Kitchen... 1 tab by mouth two times a day x 10 day    Proventil Hfa 108 (90 Base) Mcg/act Aers (Albuterol sulfate) .Marland Kitchen... 1-2 puffs by mouth q 4-6 hours as needed cough/wheeze/sob Encouraged ger to increase clear fluids and increase rest, kept  out of school for the next several days and she is to call if not improving She is sturggling with some low grade nausea, encouraged her to eat a bland diet and try ginger ale or ginger tea as needed   Problem # 2:  ATTENTION OR CONCENTRATION DEFICIT (ICD-799.51) Vyvanse in the split dosing has been working well until she got acutely ill, no change in therapy today will reevaluate at next visit.  Complete Medication List: 1)  Fish Oil 1000 Mg Caps (Omega-3 fatty acids) .Marland Kitchen.. 1 cap by mouth daily 2)  Vyvanse 40 Mg Caps (Lisdexamfetamine dimesylate) .Marland Kitchen.. 1 cap by mouth qam 3)  Vyvanse 30 Mg Caps (Lisdexamfetamine dimesylate) .Marland Kitchen.. 1 cap by mouth daily at noon 4)  Cefdinir 300 Mg Caps (Cefdinir) .Marland Kitchen.. 1 cap by mouth two times a day x 14 days 5)  Align 4 Mg Caps (Probiotic product) .Marland Kitchen.. 1 cap by mouth daily as needed antibiotics 6)  Mucinex 600 Mg Xr12h-tab (Guaifenesin) .Marland Kitchen.. 1 tab by mouth two times a day x 10 day 7)  Veramyst 27.5 Mcg/spray Susp (Fluticasone furoate) .... 2 sprays each nostril as needed congestion 8)  Nasal Moisturizer 0.65 % Soln (Saline) .... 2 sprays each nostril two times a day as needed congestion 9)  Proventil Hfa 108 (90 Base) Mcg/act Aers (Albuterol sulfate) .Marland Kitchen.. 1-2 puffs by mouth q 4-6 hours as needed cough/wheeze/sob \  Patient Instructions: 1)  Please schedule a follow-up appointment as needed and keep next appt as scheduled. 2)  Push clear fluids 3)  Use ginger ale or ginger tea as needed for nausea 4)  Take your antibiotic as prescribed until ALL of it is gone, but stop if you develop a rash or swelling and contact our office as soon as possible.  5)  Acute Bronchitis symptoms for less then 10 days are not  helped by antibiotics. Take over the counter cough medications. Call if no improvement in 5-7 days, sooner if increasing cough, fever, or new symptoms ( shortness of breath, chest pain) .  Prescriptions: PROVENTIL HFA 108 (90 BASE) MCG/ACT AERS (ALBUTEROL  SULFATE) 1-2 puffs by mouth q 4-6 hours as needed cough/wheeze/SOB  #1 x 1   Entered and Authorized by:   Danise Edge MD   Signed by:   Danise Edge MD on 11/27/2010   Method used:   Electronically to        [id number] (retail)       [address]       [place], [place]  [address]       Ph: [phone] or [phone]       Fax: [phone]   RxID:   [id number] VERAMYST 27.5 MCG/SPRAY SUSP (FLUTICASONE FUROATE) 2 sprays each nostril as needed congestion  #1 x 0   Entered and Authorized by:   Danise Edge MD   Signed by:   Danise Edge MD on 11/27/2010   Method used:   Samples Given   RxID:   [id number] CEFDINIR 300 MG CAPS (CEFDINIR) 1 cap by mouth two times a day x 14 days  #28 x 0   Entered and Authorized by:   Danise Edge MD   Signed by:  Danise Edge MD on 11/27/2010   Method used:   Electronically to        [id number] (retail)       [address]       [place], [place]  [address]       Ph: [phone] or [phone]       Fax: [phone]   RxID:   [id number]    Orders Added: 1)  Est. Patient Level III [27[address]]

## 2010-12-05 NOTE — Progress Notes (Signed)
Summary: Vyvanse refill  Phone Note Refill Request Call back at Home Phone [phone]   Refills Requested: Medication #1:  VYVANSE 40 MG CAPS 1 cap by mouth qam   Dosage confirmed as above?Dosage Confirmed  Medication #2:  VYVANSE 30 MG CAPS 1 cap by mouth daily at noon.   Dosage confirmed as above?Dosage Confirmed Pt left a message stating she needs both of her Vyvanse RX's filled. Please advise refill request.  Initial call taken by: [name] RMA,  November 26, 2010 12:22 PM  Follow-up for Phone Call        OK to refill for 3 months since dose is now stable, just ask what day she wants to get them so they are dated correctly Follow-up by: Danise Edge MD,  November 26, 2010 2:02 PM    Prescriptions: VYVANSE 30 MG CAPS (LISDEXAMFETAMINE DIMESYLATE) 1 cap by mouth daily at noon  #30 x 0   Entered by:   [name] RMA   Authorized by:   Danise Edge MD   Signed by:   [name] RMA on 11/26/2010   Method used:   Print then Give to Patient   RxID:   [id number] VYVANSE 40 MG CAPS (LISDEXAMFETAMINE DIMESYLATE) 1 cap by mouth qam  #30 x 0   Entered by:   [name] RMA   Authorized by:   Danise Edge MD   Signed by:   [name] RMA on 11/26/2010   Method used:   Print then Give to Patient   RxID:   [id number]  Pt informed that RX's are available at front desk/ [facility]

## 2010-12-05 NOTE — Letter (Signed)
Summary: Out of School  [place] at [facility]  [address]   [place], [place] [address]   Phone: [phone]  Fax: [phone]    November 27, 2010   Student:  Bonnie Lewis    To Whom It May Concern:   For Medical reasons, please excuse the above named student from school for the following dates: Patient was seen today and was acutely ill with bronchitis, she has been ill for several weeks now.  Start:   November 26, 2010  End:    November 30, 2010, she should be able to return to classes that day after treatment  If you need additional information, please feel free to contact our office.   Sincerely,    Danise Edge MD    ****This is a legal document and cannot be tampered with.  Schools are authorized to verify all information and to do so accordingly.

## 2010-12-11 NOTE — Progress Notes (Signed)
Summary: School note  Phone Note Call from Patient   Summary of Call: Pt called stating that she would like to have the Gardisil injection at her next office visit in March. Pt also didn't go to school on Friday so pt would like a note for school stating she was off all week due to illness. Pt stated she would call back to give [place] the fax number to fax this note to the school. I left a message for pt to return my call. Initial call taken by: [name] RMA,  December 05, 2010 8:32 AM  Follow-up for Phone Call        Will provide note for out of school 2/13 to 2/17 Follow-up by: Danise Edge MD,  December 05, 2010 8:45 AM  Additional Follow-up for Phone Call Additional follow up Details #1::        Will inform pt when she calls back Additional Follow-up by: [name] RMA,  December 05, 2010 8:57 AM    Additional Follow-up for Phone Call Additional follow up Details #2::    pt is going to call back with the spellings and fax numbers of her professors. Follow-up by: [name] RMA,  December 05, 2010 4:27 PM  Additional Follow-up for Phone Call Additional follow up Details #3:: Details for Additional Follow-up Action Taken: Faxed to [place] [place] [phone] Additional Follow-up by: [name] RMA,  December 05, 2010 4:43 PM

## 2010-12-11 NOTE — Letter (Signed)
Summary: Out of School  [place] [facility]  [address]   [place], [place] [address]   Phone: [phone]  Fax: [phone]    December 05, 2010   Student:  Clydell Hakim    To Whom It May Concern:   For Medical reasons, please excuse the above named student from school for the following dates:  Start:   November 26, 2010  End:    November 30, 2010, may return to classes on 12/03/2010  If you need additional information, please feel free to contact our office.   Sincerely,    Danise Edge MD    ****This is a legal document and cannot be tampered with.  Schools are authorized to verify all information and to do so accordingly.

## 2010-12-18 ENCOUNTER — Ambulatory Visit: Payer: Self-pay | Admitting: Family Medicine

## 2010-12-19 ENCOUNTER — Ambulatory Visit (INDEPENDENT_AMBULATORY_CARE_PROVIDER_SITE_OTHER): Payer: Commercial Indemnity | Admitting: Family Medicine

## 2010-12-19 ENCOUNTER — Encounter: Payer: Self-pay | Admitting: Family Medicine

## 2010-12-19 DIAGNOSIS — R41840 Attention and concentration deficit: Secondary | ICD-10-CM

## 2010-12-19 DIAGNOSIS — J209 Acute bronchitis, unspecified: Secondary | ICD-10-CM

## 2010-12-19 DIAGNOSIS — M2140 Flat foot [pes planus] (acquired), unspecified foot: Secondary | ICD-10-CM

## 2010-12-19 DIAGNOSIS — Z23 Encounter for immunization: Secondary | ICD-10-CM

## 2010-12-23 ENCOUNTER — Encounter: Payer: Self-pay | Admitting: Family Medicine

## 2011-01-01 NOTE — Assessment & Plan Note (Signed)
Summary: 2 month follow up/ vfw rsch per patient/dt   Vital Signs:  Patient profile:   25 year old female Menstrual status:  regular Height:      64.5 inches (163.83 cm) Weight:      156 pounds (70.91 kg) O2 Sat:      98 % on Room air Temp:     98.0 degrees F (36.67 degrees C) oral Pulse rate:   73 / minute BP sitting:   121 / 83  (right arm) Cuff size:   regular  Vitals Entered By: [name] RMA (December 19, 2010 3:12 PM)  O2 Flow:  Room air CC: 2 month follow up/ [facility] Is Patient Diabetic? No   History of Present Illness: Patient is a 25 yo Bonnie Lewis who is in today for reevaluation of multiple concerns. Her ADD is greatly improved on the split dosing of Vyvanse and she is able to concentrate and complete her studies much better now. No CP/palp/HA/irritability. She continues to struggle with occasional back pain or foot pain but finds this tolerable and has yet to go for imaging and referrals as previously ordered. No acute illness/fevers/chills/GI or GU c/o.  Current Medications (verified): 1)  Fish Oil 1000 Mg Caps (Omega-3 Fatty Acids) .Marland Kitchen.. 1 Cap By Mouth Daily 2)  Vyvanse 40 Mg Caps (Lisdexamfetamine Dimesylate) .Marland Kitchen.. 1 Cap By Mouth Qam 3)  Vyvanse 30 Mg Caps (Lisdexamfetamine Dimesylate) .Marland Kitchen.. 1 Cap By Mouth Daily At Noon 4)  Calcium .... Once Daily  Allergies (verified): 1)  ! Sulfa  Past History:  Past medical history reviewed for relevance to current acute and chronic problems. Social history (including risk factors) reviewed for relevance to current acute and chronic problems.  Social History: Reviewed history from 06/27/2010 and no changes required. Single Never Smoked, very infrequent light habit in past Alcohol use-no Drug use-no Occupation: taking pre Occupational Therapy classes Regular exercise-yes, yoga Seat Belt use is regular No dietary restrictions  Review of Systems      See HPI  Physical Exam  General:   Well-developed,well-nourished,in no acute distress; alert,appropriate and cooperative throughout examination Head:  Normocephalic and atraumatic without obvious abnormalities. No apparent alopecia or balding. Mouth:  Oral mucosa and oropharynx without lesions or exudates.  Teeth in good repair. Neck:  No deformities, masses, or tenderness noted. Lungs:  Normal respiratory effort, chest expands symmetrically. Lungs are clear to auscultation, no crackles or wheezes. Heart:  Normal rate and regular rhythm. S1 and S2 normal without gallop, murmur, click, rub or other extra sounds. Abdomen:  Bowel sounds positive,abdomen soft and non-tender without masses, organomegaly or hernias noted. Msk:  No deformity or scoliosis noted of thoracic or lumbar spine.   Extremities:  No clubbing, cyanosis, edema, or deformity noted with normal full range of motion of all joints.   Cervical Nodes:  No lymphadenopathy noted Psych:  Cognition and judgment appear intact. Alert and cooperative with normal attention span and concentration. No apparent delusions, illusions, hallucinations   Impression & Recommendations:  Problem # 1:  ACUTE BRONCHITIS (ICD-466.0)  The following medications were removed from the medication list:    Cefdinir 300 Mg Caps (Cefdinir) .Marland Kitchen... 1 cap by mouth two times a day x 14 days    Mucinex 600 Mg Xr12h-tab (Guaifenesin) .Marland Kitchen... 1 tab by mouth two times a day x 10 day    Proventil Hfa 108 (90 Base) Mcg/act Aers (Albuterol sulfate) .Marland Kitchen... 1-2 puffs by mouth q 4-6 hours as needed cough/wheeze/sob Resolved, no persistent  symptoms, call if any concerns  Problem # 2:  ATTENTION OR CONCENTRATION DEFICIT (ICD-799.51) Doing well on Vyvanse 40 in am and 30mg  at noon  Problem # 3:  FLAT FOOT (ICD-734) Minor complaints, consider a podiatry evaluation  Complete Medication List: 1)  Fish Oil 1000 Mg Caps (Omega-3 fatty acids) .Marland Kitchen.. 1 cap by mouth daily 2)  Vyvanse 40 Mg Caps (Lisdexamfetamine  dimesylate) .Marland Kitchen.. 1 cap by mouth qam march prescription 3)  Vyvanse 30 Mg Caps (Lisdexamfetamine dimesylate) .Marland Kitchen.. 1 cap by mouth daily at noon march prescription 4)  Calcium  .... Once daily 5)  Vyvanse 40 Mg Caps (Lisdexamfetamine dimesylate) .Marland Kitchen.. 1 cap by mouth qam april prescription 6)  Vyvanse 40 Mg Caps (Lisdexamfetamine dimesylate) .Marland Kitchen.. 1 cap by mouth q am may prescription 7)  Vyvanse 30 Mg Caps (Lisdexamfetamine dimesylate) .Marland Kitchen.. 1 cap by mouth q noon april prescription 8)  Vyvanse 30 Mg Caps (Lisdexamfetamine dimesylate) .Marland Kitchen.. 1 cap by mouth q noon  may prescription  Other Orders: HPV Vaccine - 3 sched doses - IM (11914) Admin 1st Vaccine (78295)  Patient Instructions: 1)  Please schedule a follow-up appointment in 3 months .  2)  Minimize caffeine 3)  Try Hyland's Calms Forte 2-3 tabs at bed as needed insomnia 4)  Maintain hi fiber diet with increased fluids and probiotics  Prescriptions: VYVANSE 30 MG CAPS (LISDEXAMFETAMINE DIMESYLATE) 1 cap by mouth q noon  May prescription  #30 x 0   Entered and Authorized by:   Danise Edge MD   Signed by:   Danise Edge MD on 12/19/2010   Method used:   Print then Give to Patient   RxID:   [id number] VYVANSE 30 MG CAPS (LISDEXAMFETAMINE DIMESYLATE) 1 cap by mouth q noon April prescription  #30 x 0   Entered and Authorized by:   Danise Edge MD   Signed by:   Danise Edge MD on 12/19/2010   Method used:   Print then Give to Patient   RxID:   [id number] VYVANSE 40 MG CAPS (LISDEXAMFETAMINE DIMESYLATE) 1 cap by mouth q am May prescription  #30 x 0   Entered and Authorized by:   Danise Edge MD   Signed by:   Danise Edge MD on 12/19/2010   Method used:   Print then Give to Patient   RxID:   [id number] VYVANSE 40 MG CAPS (LISDEXAMFETAMINE DIMESYLATE) 1 cap by mouth qam April prescription  #30 x 0   Entered and Authorized by:   Danise Edge MD   Signed by:   Danise Edge MD on 12/19/2010   Method used:   Print then Give to  Patient   RxID:   [id number] VYVANSE 30 MG CAPS (LISDEXAMFETAMINE DIMESYLATE) 1 cap by mouth daily at noon March prescription  #30 x 0   Entered and Authorized by:   Danise Edge MD   Signed by:   Danise Edge MD on 12/19/2010   Method used:   Print then Give to Patient   RxID:   [id number] VYVANSE 40 MG CAPS (LISDEXAMFETAMINE DIMESYLATE) 1 cap by mouth qam March prescription  #30 x 0   Entered and Authorized by:   Danise Edge MD   Signed by:   Danise Edge MD on 12/19/2010   Method used:   Print then Give to Patient   RxID:   [id number]    Orders Added: 1)  HPV Vaccine - 3 sched doses - IM [28315] 2)  Admin 1st Vaccine [90471] 3)  Est. Patient  Level III [60454]   Immunizations Administered:  HPV # 1:    Vaccine Type: Gardasil    Site: right deltoid    Mfr: Merck    Dose: 0.5 ml    Route: [place]    Given by: [name] RMA    Exp. Date: 05/28/2013    Lot #: [id number]    VIS given: 02/13/10 version given December 19, 2010.   Immunizations Administered:  HPV # 1:    Vaccine Type: Gardasil    Site: right deltoid    Mfr: Merck    Dose: 0.5 ml    Route: [place]    Given by: [name] RMA    Exp. Date: 05/28/2013    Lot #: [id number]    VIS given: 02/13/10 version given December 19, 2010.

## 2011-01-16 ENCOUNTER — Ambulatory Visit: Payer: Commercial Indemnity

## 2011-01-28 ENCOUNTER — Ambulatory Visit (INDEPENDENT_AMBULATORY_CARE_PROVIDER_SITE_OTHER): Payer: Commercial Indemnity | Admitting: *Deleted

## 2011-01-28 DIAGNOSIS — Z23 Encounter for immunization: Secondary | ICD-10-CM

## 2011-03-20 ENCOUNTER — Ambulatory Visit (INDEPENDENT_AMBULATORY_CARE_PROVIDER_SITE_OTHER): Payer: Commercial Indemnity | Admitting: Family Medicine

## 2011-03-20 ENCOUNTER — Encounter: Payer: Self-pay | Admitting: Family Medicine

## 2011-03-20 DIAGNOSIS — L708 Other acne: Secondary | ICD-10-CM

## 2011-03-20 DIAGNOSIS — M549 Dorsalgia, unspecified: Secondary | ICD-10-CM

## 2011-03-20 DIAGNOSIS — F909 Attention-deficit hyperactivity disorder, unspecified type: Secondary | ICD-10-CM

## 2011-03-20 DIAGNOSIS — L709 Acne, unspecified: Secondary | ICD-10-CM

## 2011-03-20 DIAGNOSIS — E782 Mixed hyperlipidemia: Secondary | ICD-10-CM

## 2011-03-20 DIAGNOSIS — R41840 Attention and concentration deficit: Secondary | ICD-10-CM

## 2011-03-20 MED ORDER — LISDEXAMFETAMINE DIMESYLATE 30 MG PO CAPS: 30.0000 mg | ORAL_CAPSULE | ORAL | Status: DC

## 2011-03-20 MED ORDER — LISDEXAMFETAMINE DIMESYLATE 40 MG PO CAPS: 40.0000 mg | ORAL_CAPSULE | ORAL | Status: DC

## 2011-03-20 MED ORDER — LISDEXAMFETAMINE DIMESYLATE 30 MG PO CAPS: 30.0000 mg | ORAL_CAPSULE | Freq: Every day | ORAL | Status: DC

## 2011-03-20 MED ORDER — AZELAIC ACID 15 % EX GEL: CUTANEOUS | Status: DC

## 2011-03-20 NOTE — Assessment & Plan Note (Signed)
Has increased her fish oil caps to

## 2011-03-20 NOTE — Patient Instructions (Signed)
Contact Dermatitis Contact dermatitis is a reaction to certain substances that touch the skin. The substances may irritate the skin or cause an allergic reaction. Many substances can cause contact dermatitis. Common causes are cosmetics, jewelry, soaps, solvents and any number of chemicals. The area of skin that is exposed becomes dry, red, cracked, and itchy, and looks like a rash. In severe cases, blisters may develop. Symptoms (problems) can be controlled with treatment and by avoiding substances that caused the reaction. TREATMENT AND PREVENTION MEASURES  Keep the area of skin that is affected away from hot water, soap, sunlight, chemicals, acidic substances, or anything else that would irritate your skin. Do not rub the skin.   Use medications as directed.   You may use:   A topical steroid to reduce inflammation (redness or soreness) and anti-bacterial (germ) ointments for secondary infections.   Lubricants to keep moisture in your skin.   Burrow's solution to reduce inflammation or dry rash if weeping. Mix one packet or tablet in 2 cups cool water. Dip a clean washcloth in the mixture, wring it out a bit, and put it on the affected area. Leave in place for 30 minutes, then re-soak the washcloth and apply again. Do this as often as possible throughout the day.   If the area is too large to cover with a wash cloth, take several cornstarch or baking soda baths daily.   You may want to rest affected areas until less sore.  SEEK IMMEDIATE MEDICAL CARE IF:  An oral temperature above 104 develops.   You see signs of infection, such as swelling, tenderness, inflammation (redness or soreness), or warmth of affected area.   Treatment does not relieve your symptoms within 2 days, or as suggested by your caregiver.   You have any problems related to medication used.  Document Released: 09/27/2000 Document Re-Released: 03/20/2010 [facility] Patient Information 2011 [place], [place].  Consider  [facility] Astringent and try Lotrimin twice a day to lesion if no resolution let us know.

## 2011-03-21 NOTE — Assessment & Plan Note (Addendum)
Patient has seen Dermatology but not wish to return. She is requesting a prescription for Azelaic Acid which is provided.

## 2011-03-21 NOTE — Assessment & Plan Note (Signed)
Pain is intermittent and manageable at this time. She will notify us if she is interested in going to get her xrays for further information. Encouraged ongoing exercise and stretching

## 2011-03-21 NOTE — Progress Notes (Signed)
Bonnie Lewis [id number] [birth year] 03/21/2011      Progress Note-Follow Up  Subjective  Chief Complaint  Chief Complaint  Patient presents with  . Follow-up    2 month  . Medication Refill    HPI  Patient is in today for follow up on her ADD and is doing well. She feels the split dosing is working well. She is out of school for a couple of weeks so she is not always taking the 30mg  dose int he afternoon. She is starting summer school so she will stay on the bid dosing for the summer. No HA/CP/palp/SOB/GI or GU c/o. No recent illness/fevers/chills/new concerns.  Past Medical History  Diagnosis Date  . ADD (attention deficit disorder)     History reviewed. No pertinent past surgical history.  Family History  Problem Relation Age of Onset  . Asthma Father   . Depression Father     and anxiety  . Allergies Father   . Irritable bowel syndrome Father   . Depression Sister   . Irritable bowel syndrome Sister   . Migraines Sister   . Depression Brother   . Allergies Brother   . Depression Maternal Grandmother     Anxiety  . Hypertension Maternal Grandmother   . ADD / ADHD Maternal Grandmother   . Depression Paternal Grandmother   . Cardiomyopathy Paternal Grandmother   . Hyperlipidemia Mother   . Anemia Sister   . Coronary artery disease Paternal Grandfather     History   Social History  . Marital Status: Single    Spouse Name: N/A    Number of Children: N/A  . Years of Education: N/A   Occupational History  . Not on file.   Social History Main Topics  . Smoking status: Never Smoker   . Smokeless tobacco: Never Used  . Alcohol Use: No  . Drug Use: No  . Sexually Active: Not on file   Other Topics Concern  . Not on file   Social History Narrative  . No narrative on file    Current Outpatient Prescriptions on File Prior to Visit  Medication Sig Dispense Refill  . Calcium Carbonate (CALCIUM 500 PO) Take by mouth daily.        . fish oil-omega-3 fatty  acids 1000 MG capsule Take 1,000 mg by mouth daily.        Marland Kitchen albuterol (PROVENTIL HFA) 108 (90 BASE) MCG/ACT inhaler Inhale 2 puffs into the lungs every 6 (six) hours as needed. For cough/wheeze/ SOB       . cefdinir (OMNICEF) 300 MG capsule Take 300 mg by mouth 2 (two) times daily. X 14 days       . fluticasone (VERAMYST) 27.5 MCG/SPRAY nasal spray 2 sprays by Nasal route daily as needed. For congestion       . guaiFENesin (MUCINEX) 600 MG 12 hr tablet Take 1,200 mg by mouth 2 (two) times daily. X 10 days       . Probiotic Product (ALIGN) 4 MG CAPS Take by mouth daily as needed. Take while on antibiotics       . Saline (SODIUM CHLORIDE) 0.65 % SOLN 2 sprays by Nasal route 2 (two) times daily as needed. For congestion         Allergies  Allergen Reactions  . Sulfonamide Derivatives     Review of Systems  Review of Systems  Constitutional: Negative for fever and malaise/fatigue.  HENT: Negative for congestion.   Eyes: Negative for discharge.  Respiratory: Negative  for shortness of breath.   Cardiovascular: Negative for chest pain, palpitations and leg swelling.  Gastrointestinal: Negative for nausea, abdominal pain and diarrhea.  Genitourinary: Negative for dysuria.  Musculoskeletal: Negative for falls.  Skin: Negative for rash.  Neurological: Negative for loss of consciousness and headaches.  Endo/Heme/Allergies: Negative for polydipsia.  Psychiatric/Behavioral: Negative for depression and suicidal ideas. The patient is not nervous/anxious and does not have insomnia.     Objective  BP 119/80  Pulse 67  Temp(Src) 97.3 F (36.3 C) (Oral)  Ht 5' 4.5" (1.638 m)  Wt 150 lb (68.04 kg)  BMI 25.35 kg/m2  SpO2 100%  LMP 03/18/2011  Physical Exam  Physical Exam  Constitutional: She is oriented to person, place, and time and well-developed, well-nourished, and in no distress. No distress.  HENT:  Head: Normocephalic and atraumatic.  Eyes: Conjunctivae are normal.  Neck: Neck  supple. No thyromegaly present.  Cardiovascular: Normal rate, regular rhythm and normal heart sounds.   No murmur heard. Pulmonary/Chest: Effort normal and breath sounds normal. She has no wheezes.  Abdominal: She exhibits no distension and no mass.  Musculoskeletal: She exhibits no edema.  Lymphadenopathy:    She has no cervical adenopathy.  Neurological: She is alert and oriented to person, place, and time.  Skin: Skin is warm and dry. No rash noted. She is not diaphoretic.  Psychiatric: Memory, affect and judgment normal.    Lab Results  Component Value Date   TSH 1.85 07/03/2010   Lab Results  Component Value Date   WBC 8.0 07/03/2010   HGB 12.5 07/03/2010   HCT 36.4 07/03/2010   MCV 85.8 07/03/2010   PLT 268.0 07/03/2010   Lab Results  Component Value Date   CREATININE 0.8 07/03/2010   BUN 11 07/03/2010   NA 138 07/03/2010   K 4.3 07/03/2010   CL 106 07/03/2010   CO2 23 07/03/2010   Lab Results  Component Value Date   ALT 14 07/03/2010   AST 18 07/03/2010   ALKPHOS 54 07/03/2010   BILITOT 0.5 07/03/2010   Lab Results  Component Value Date   CHOL 177 07/03/2010   Lab Results  Component Value Date   HDL 48.20 07/03/2010   Lab Results  Component Value Date   LDLCALC 111* 07/03/2010   Lab Results  Component Value Date   TRIG 91.0 07/03/2010   Lab Results  Component Value Date   CHOLHDL 4 07/03/2010     Assessment & Plan  Mixed hyperlipidemia Has increased her fish oil caps to   BACK PAIN Pain is intermittent and manageable at this time. She will notify us if she is interested in going to get her xrays for further information. Encouraged ongoing exercise and stretching  ATTENTION OR CONCENTRATION DEFICIT Stable on the split dosing of the Vyvanse, refills given today  ACNE VULGARIS Patient has seen Dermatology but not wish to return. She is requesting a prescription for Azelaic Acid which is provided.

## 2011-03-21 NOTE — Assessment & Plan Note (Signed)
Stable on the split dosing of the Vyvanse, refills given today

## 2011-03-22 ENCOUNTER — Telehealth: Payer: Self-pay

## 2011-03-22 MED ORDER — AZELAIC ACID 20 % EX CREA: TOPICAL_CREAM | Freq: Two times a day (BID) | CUTANEOUS | Status: DC

## 2011-03-22 NOTE — Telephone Encounter (Signed)
Left a detailed message on pts vm stating RX was called into pharmacy

## 2011-03-22 NOTE — Telephone Encounter (Signed)
Pt ledft a message stating that the pharmacy states they don't have Azelaic Acid in 15% cream? Pt states she thought a gel was going to be refilled? Pt would like a med sent to the pharmacy? Please advise? Pt call back number [phone]

## 2011-03-22 NOTE — Telephone Encounter (Signed)
Please call the pharmacy and see if they have any products with Azelaic what strength and what type (gel vs cream) and we will rx what they have

## 2011-06-19 ENCOUNTER — Ambulatory Visit (INDEPENDENT_AMBULATORY_CARE_PROVIDER_SITE_OTHER): Payer: 59 | Admitting: Family Medicine

## 2011-06-19 ENCOUNTER — Other Ambulatory Visit: Payer: Self-pay | Admitting: Family Medicine

## 2011-06-19 ENCOUNTER — Encounter: Payer: Self-pay | Admitting: Family Medicine

## 2011-06-19 VITALS — BP 124/83 | HR 81 | Temp 98.1°F | Ht 64.5 in | Wt 143.4 lb

## 2011-06-19 DIAGNOSIS — Z23 Encounter for immunization: Secondary | ICD-10-CM

## 2011-06-19 DIAGNOSIS — E782 Mixed hyperlipidemia: Secondary | ICD-10-CM

## 2011-06-19 DIAGNOSIS — F909 Attention-deficit hyperactivity disorder, unspecified type: Secondary | ICD-10-CM

## 2011-06-19 DIAGNOSIS — L708 Other acne: Secondary | ICD-10-CM

## 2011-06-19 DIAGNOSIS — R41840 Attention and concentration deficit: Secondary | ICD-10-CM

## 2011-06-19 DIAGNOSIS — E663 Overweight: Secondary | ICD-10-CM

## 2011-06-19 DIAGNOSIS — M4120 Other idiopathic scoliosis, site unspecified: Secondary | ICD-10-CM

## 2011-06-19 DIAGNOSIS — M549 Dorsalgia, unspecified: Secondary | ICD-10-CM

## 2011-06-19 MED ORDER — LISDEXAMFETAMINE DIMESYLATE 30 MG PO CAPS: 30.0000 mg | ORAL_CAPSULE | Freq: Every day | ORAL | Status: DC

## 2011-06-19 MED ORDER — LISDEXAMFETAMINE DIMESYLATE 30 MG PO CAPS: ORAL_CAPSULE | ORAL | Status: DC

## 2011-06-19 MED ORDER — LISDEXAMFETAMINE DIMESYLATE 40 MG PO CAPS: 40.0000 mg | ORAL_CAPSULE | ORAL | Status: DC

## 2011-06-19 MED ORDER — LISDEXAMFETAMINE DIMESYLATE 40 MG PO CAPS: ORAL_CAPSULE | ORAL | Status: DC

## 2011-06-19 NOTE — Telephone Encounter (Signed)
Please advise 

## 2011-06-19 NOTE — Assessment & Plan Note (Signed)
Mild elevation in LDL cholesterol last year. At next visit we will check lipids again and reevaluate

## 2011-06-19 NOTE — Assessment & Plan Note (Signed)
Doing well on split dosing of Vyvanse 40 mg in am and 30mg  at noon. Given 3 month supply.

## 2011-06-19 NOTE — Assessment & Plan Note (Signed)
Tolerable encouraged to maintain exercises and healthy weight

## 2011-06-19 NOTE — Patient Instructions (Signed)
Attention Deficit-Hyperactivity Disorder ADHD Attention deficit-hyperactivity disorder (ADHD) is a problem with behavior issues based on the way the brain functions (neurobehavioral disorder). It is a common reason for behavior and academic problems in school. CAUSES The cause of ADHD is unknown in most cases. It may run in families. It sometimes can be associated with learning disabilities and other behavioral problems. SYMPTOMS There are three types of ADHD. Some of the symptoms include:  Inattentive   Gets bored or distracted easily   Loses or forgets things. Forgets to hand in homework.   Has trouble organizing or completing tasks.   Difficulty staying on task.   An inability to organize daily tasks and school work.   Leaving projects, chores and homework unfinished.   Trouble paying attention or responding to details. Careless mistakes.   Difficulty following directions. Often seems like is not listening.   Dislikes activities that require sustained attention (like chores or homework).   Hyperactive-impulsive   Feels like it is impossible to sit still or stay in a seat. Fidgeting with hands and feet.   Trouble waiting turn.   Talking too much or out of turn. Interruptive.   Speaks or acts impulsively   Aggressive, disruptive behavior   Constantly busy or on the go, noisy.   Combined   Has symptoms of both of the above.  Often children with ADHD feel discouraged about themselves and with school. They often perform well below their abilities in school. These symptoms can cause problems in home, school, and in relationships with peers. As children get older, the excess motor activities can calm down, but the problems with paying attention and staying organized persist. Most children do not outgrow ADHD but with good treatment can learn to cope with the symptoms. DIAGNOSIS When ADHD is suspected, the diagnosis should be made by professionals trained in ADHD.    Diagnosis will include:  Ruling out other reasons for the child's behavior.   The caregivers will check with the child's school and check their medical records.   They will talk to teachers and parents.   Behavior rating scales for the child will be filled out by those dealing with the child on a daily basis.  A diagnosis is made only after all information has been considered. TREATMENT Treatment usually includes behavioral treatment often along with medicines. It may include stimulant medicines. The stimulant medicines decrease impulsivity and hyperactivity and increase attention. Other medicines used include antidepressants and certain blood pressure medicines. Most experts agree that treatment for ADHD should address all aspects of the child's functioning. Treatment should not be limited to the use of medicines alone. Treatment should include structured classroom management. The parents must receive education to address rewarding good behavior, discipline and limit-setting. Tutoring and/or behavioral therapy should be available for the child. If untreated, the disorder can have long term serious effects into adolescence and adulthood. HOMECARE INSTRUCTIONS   Often with ADHD there is a lot of frustration among the family in dealing with the illness. There is often blame and anger that is not warranted. This is a life long illness. There is no way to prevent ADHD. In many cases, because the problem affects the family as a whole, the entire family may need help. A therapist can help the family find better ways to handle the disruptive behaviors and promote change. If the child is young, most of the therapist's work is with the parents. Parents will learn techniques for coping with and improving their child's behavior.   Sometimes only the child with the ADHD needs counseling. Your caregivers can help you make these decisions.   Children with ADHD may need help in organizing. Here are some helpful  tips:   Keep routines the same every day from wake-up time to bedtime. Schedule everything. This includes homework and playtime. This should include outdoor and indoor recreation. Keep the schedule on the refrigerator or a bulletin board where it is frequently seen. Mark schedule changes as far in advance as possible.   Have a place for everything and keep everything in its place. This includes clothing, backpacks, and school supplies.   Encourage writing down assignments and bringing home needed books.   Offer your child a well-balanced diet. Breakfast is especially important for school performance. Children should avoid drinks with caffeine including:   Soft drinks.   Coffee.   Tea.   However, some older children (adolescents) may find these drinks helpful in improving their attention.   Children with ADHD need consistent rules that they can understand and follow. If rules are followed, give small rewards. Children with ADHD often receive, and expect, criticism. Look for good behavior and praise it. Set realistic goals. Give clear instructions. Look for activities that can foster success and self-esteem. Make time for pleasant activities with your child. Give lots of affection.   Parents are their children's greatest advocates. Learn as much as possible about ADHD. This helps you become a stronger and better advocate for your child. It also helps you educate your child's teachers and instructors if they feel inadequate in these areas. Parent support groups are often helpful. A national group with local chapters is called CHADD (Children and Adults with Attention Deficit/Hyperactivity Disorder).  PROGNOSIS  There is no cure for ADHD. Children with the disorder seldom outgrow it. Many find adaptive ways to accommodate the ADHD as they mature. SEEK MEDICAL CARE IF YOUR CHILD HAS:  Repeated muscle twitches, cough or speech outbursts.   Sleep problems.   Marked loss of appetite.    Depression.   New or worsening behavioral problems.   Dizziness.   Racing heart.   Stomach pains.   Headaches.  Document Released: 09/20/2002 Document Re-Released: 07/09/2008 [employer] Patient Information 2011 [employer]. 

## 2011-06-19 NOTE — Assessment & Plan Note (Signed)
Doing well on current topical Azelex and Prascion given refill on Azelex

## 2011-06-19 NOTE — Assessment & Plan Note (Addendum)
Has had significant weight loss over this past year. She has made a strong effort to improve her diet. Has given up all caffeine and alcohol and is eating small, frequent meals with lean proteins and complex carbs. She reports she gets hungry and does not have any abdominal pain. Likely related to lifestyle changes and Vyvanse. Will check TSH at next visit and reevaluate. Her BMI is now in the ideal range for the first time at 24.24. Will monitor

## 2011-06-20 DIAGNOSIS — Z23 Encounter for immunization: Secondary | ICD-10-CM | POA: Insufficient documentation

## 2011-06-20 NOTE — Assessment & Plan Note (Signed)
Given Gardisil #3 and flu shot today, tolerated well

## 2011-06-20 NOTE — Assessment & Plan Note (Signed)
No acute issues but is still interested in a set of xrays in the future if her pain continues to recur. Encouraged ongoing exercises

## 2011-06-20 NOTE — Progress Notes (Signed)
Bonnie Lewis [id number] October 01, 1986 06/20/2011      Progress Note-Follow Up  Subjective  Chief Complaint  Chief Complaint  Patient presents with  . Medication Refill    Vyvanse  . Follow-up    3 month follow up    HPI  Patient is 25 year old Caucasian female who is in today for followup of multiple conditions. She has ADD and is doing very well on Vyvanse 40 mg in a.m. and 30 mg in the PM. She has steadily lost weight since our meeting a year ago but has changed her diet significantly. He is eating less fast food, no transplants. Eating lean proteins lots of fresh vegetables and complex carbs. She denies any anorexia or nausea from the vitamins. She also has started exercising over this past year. She denies any change in bowel habits does get hungry regularly. No chest pain, palpitations, recent illness, fevers, chills. She is here today for her final course of time. She is having good response to her 2 cleansers for her acne and would like to continue on those.  Past Medical History  Diagnosis Date  . ADD (attention deficit disorder)     History reviewed. No pertinent past surgical history.  Family History  Problem Relation Age of Onset  . Asthma Father   . Depression Father     and anxiety  . Allergies Father   . Irritable bowel syndrome Father   . Depression Sister   . Irritable bowel syndrome Sister   . Migraines Sister   . Depression Brother   . Allergies Brother   . Depression Maternal Grandmother     Anxiety  . Hypertension Maternal Grandmother   . ADD / ADHD Maternal Grandmother   . Depression Paternal Grandmother   . Cardiomyopathy Paternal Grandmother   . Hyperlipidemia Mother   . Anemia Sister   . Coronary artery disease Paternal Grandfather     History   Social History  . Marital Status: Single    Spouse Name: N/A    Number of Children: N/A  . Years of Education: N/A   Occupational History  . Not on file.   Social History Main Topics  . Smoking  status: Never Smoker   . Smokeless tobacco: Never Used  . Alcohol Use: No  . Drug Use: No  . Sexually Active: Not on file   Other Topics Concern  . Not on file   Social History Narrative  . No narrative on file    Current Outpatient Prescriptions on File Prior to Visit  Medication Sig Dispense Refill  . Calcium Carbonate (CALCIUM 500 PO) Take by mouth daily.        . fish oil-omega-3 fatty acids 1000 MG capsule Take 1,000 mg by mouth daily.        . Saline (SODIUM CHLORIDE) 0.65 % SOLN 2 sprays by Nasal route 2 (two) times daily as needed. For congestion         Allergies  Allergen Reactions  . Sulfonamide Derivatives     Review of Systems  Review of Systems  Constitutional: Negative for fever and malaise/fatigue.  HENT: Negative for congestion.   Eyes: Negative for discharge.  Respiratory: Negative for shortness of breath.   Cardiovascular: Negative for chest pain, palpitations and leg swelling.  Gastrointestinal: Negative for nausea, abdominal pain and diarrhea.  Genitourinary: Negative for dysuria.  Musculoskeletal: Positive for back pain. Negative for falls.  Skin: Negative for rash.  Neurological: Negative for loss of consciousness and headaches.  Endo/Heme/Allergies:  Negative for polydipsia.  Psychiatric/Behavioral: Negative for depression and suicidal ideas. The patient is not nervous/anxious and does not have insomnia.     Objective  BP 124/83  Pulse 81  Temp(Src) 98.1 F (36.7 C) (Oral)  Ht 5' 4.5" (1.638 m)  Wt 143 lb 6.4 oz (65.046 kg)  BMI 24.23 kg/m2  SpO2 99%  LMP 06/10/2011  Physical Exam  Physical Exam  Constitutional: She is oriented to person, place, and time and well-developed, well-nourished, and in no distress. No distress.  HENT:  Head: Normocephalic and atraumatic.  Eyes: Conjunctivae are normal.  Neck: Neck supple. No thyromegaly present.  Cardiovascular: Normal rate, regular rhythm and normal heart sounds.   No murmur  heard. Pulmonary/Chest: Effort normal and breath sounds normal. She has no wheezes.  Abdominal: She exhibits no distension and no mass.  Musculoskeletal: She exhibits no edema.  Lymphadenopathy:    She has no cervical adenopathy.  Neurological: She is alert and oriented to person, place, and time.  Skin: Skin is warm and dry. No rash noted. She is not diaphoretic.  Psychiatric: Memory, affect and judgment normal.    Lab Results  Component Value Date   TSH 1.85 07/03/2010   Lab Results  Component Value Date   WBC 8.0 07/03/2010   HGB 12.5 07/03/2010   HCT 36.4 07/03/2010   MCV 85.8 07/03/2010   PLT 268.0 07/03/2010   Lab Results  Component Value Date   CREATININE 0.8 07/03/2010   BUN 11 07/03/2010   NA 138 07/03/2010   K 4.3 07/03/2010   CL 106 07/03/2010   CO2 23 07/03/2010   Lab Results  Component Value Date   ALT 14 07/03/2010   AST 18 07/03/2010   ALKPHOS 54 07/03/2010   BILITOT 0.5 07/03/2010   Lab Results  Component Value Date   CHOL 177 07/03/2010   Lab Results  Component Value Date   HDL 48.20 07/03/2010   Lab Results  Component Value Date   LDLCALC 111* 07/03/2010   Lab Results  Component Value Date   TRIG 91.0 07/03/2010   Lab Results  Component Value Date   CHOLHDL 4 07/03/2010     Assessment & Plan  OVERWEIGHT Has had significant weight loss over this past year. She has made a strong effort to improve her diet. Has given up all caffeine and alcohol and is eating small, frequent meals with lean proteins and complex carbs. She reports she gets hungry and does not have any abdominal pain. Likely related to lifestyle changes and Vyvanse. Will check TSH at next visit and reevaluate. Her BMI is now in the ideal range for the first time at 24.24. Will monitor  ATTENTION OR CONCENTRATION DEFICIT Doing well on split dosing of Vyvanse 40 mg in am and 30mg  at noon. Given 3 month supply.  ACNE VULGARIS Doing well on current topical Azelex and Prascion given refill on  Azelex  BACK PAIN Tolerable encouraged to maintain exercises and healthy weight  Mixed hyperlipidemia Mild elevation in LDL cholesterol last year. At next visit we will check lipids again and reevaluate  SCOLIOSIS No acute issues but is still interested in a set of xrays in the future if her pain continues to recur. Encouraged ongoing exercises  Need for viral immunization Given Gardisil #3 and flu shot today, tolerated well

## 2011-06-21 ENCOUNTER — Ambulatory Visit: Payer: Commercial Indemnity | Admitting: Family Medicine

## 2011-07-26 ENCOUNTER — Ambulatory Visit: Payer: Commercial Indemnity

## 2011-08-08 ENCOUNTER — Encounter: Payer: Self-pay | Admitting: Family Medicine

## 2011-08-08 ENCOUNTER — Ambulatory Visit (INDEPENDENT_AMBULATORY_CARE_PROVIDER_SITE_OTHER): Payer: 59 | Admitting: Family Medicine

## 2011-08-08 DIAGNOSIS — R1032 Left lower quadrant pain: Secondary | ICD-10-CM

## 2011-08-08 DIAGNOSIS — J40 Bronchitis, not specified as acute or chronic: Secondary | ICD-10-CM

## 2011-08-08 DIAGNOSIS — F909 Attention-deficit hyperactivity disorder, unspecified type: Secondary | ICD-10-CM

## 2011-08-08 DIAGNOSIS — R5381 Other malaise: Secondary | ICD-10-CM

## 2011-08-08 DIAGNOSIS — R5383 Other fatigue: Secondary | ICD-10-CM

## 2011-08-08 DIAGNOSIS — J029 Acute pharyngitis, unspecified: Secondary | ICD-10-CM

## 2011-08-08 MED ORDER — AZITHROMYCIN 250 MG PO TABS: ORAL_TABLET | ORAL | Status: AC

## 2011-08-08 MED ORDER — LISDEXAMFETAMINE DIMESYLATE 30 MG PO CAPS: ORAL_CAPSULE | ORAL | Status: DC

## 2011-08-08 MED ORDER — LISDEXAMFETAMINE DIMESYLATE 40 MG PO CAPS: ORAL_CAPSULE | ORAL | Status: DC

## 2011-08-08 NOTE — Patient Instructions (Signed)

## 2011-08-09 LAB — CBC
HCT: 36.6 % (ref 36.0–46.0)
Hemoglobin: 11.9 g/dL — ABNORMAL LOW (ref 12.0–15.0)
MCV: 88.4 fL (ref 78.0–100.0)
RDW: 13.4 % (ref 11.5–15.5)
WBC: 6.2 10*3/uL (ref 4.0–10.5)

## 2011-08-11 ENCOUNTER — Encounter: Payer: Self-pay | Admitting: Family Medicine

## 2011-08-11 DIAGNOSIS — J40 Bronchitis, not specified as acute or chronic: Secondary | ICD-10-CM | POA: Insufficient documentation

## 2011-08-11 DIAGNOSIS — R1032 Left lower quadrant pain: Secondary | ICD-10-CM | POA: Insufficient documentation

## 2011-08-11 NOTE — Progress Notes (Signed)
Bonnie Lewis [id number] Aug 20, 1986 08/11/2011      Progress Note-Follow Up  Subjective  Chief Complaint  Chief Complaint  Patient presents with  . URI    HPI  Patient in today with her mother with over a week of sore throat, ear pain, fatigue, malaise, myalgias, nasal congestion, slight nausea, fevers, chills. No CP/palp/SOB/vomitting, diarrhea.  Past Medical History  Diagnosis Date  . ADD (attention deficit disorder)   . Bronchitis 08/11/2011    History reviewed. No pertinent past surgical history.  Family History  Problem Relation Age of Onset  . Asthma Father   . Depression Father     and anxiety  . Allergies Father   . Irritable bowel syndrome Father   . Depression Sister   . Irritable bowel syndrome Sister   . Migraines Sister   . Depression Brother   . Allergies Brother   . Depression Maternal Grandmother     Anxiety  . Hypertension Maternal Grandmother   . ADD / ADHD Maternal Grandmother   . Depression Paternal Grandmother   . Cardiomyopathy Paternal Grandmother   . Hyperlipidemia Mother   . Anemia Sister   . Coronary artery disease Paternal Grandfather     History   Social History  . Marital Status: Single    Spouse Name: N/A    Number of Children: N/A  . Years of Education: N/A   Occupational History  . Not on file.   Social History Main Topics  . Smoking status: Never Smoker   . Smokeless tobacco: Never Used  . Alcohol Use: No  . Drug Use: No  . Sexually Active: Not on file   Other Topics Concern  . Not on file   Social History Narrative  . No narrative on file    Current Outpatient Prescriptions on File Prior to Visit  Medication Sig Dispense Refill  . AZELEX 20 % cream AFTER SKIN IS THOROUGHLY WASHED AND PATTED DRY MASSAGE CREAM INTO AFFECTED AREA IN AM AND PM  50 g  2  . Calcium Carbonate (CALCIUM 500 PO) Take by mouth daily.        . fish oil-omega-3 fatty acids 1000 MG capsule Take 1,000 mg by mouth daily.        .  Sulfacetamide Sodium-Sulfur (PRASCION AV CLEANSER) 10-5 % EMUL Apply topically 2 (two) times daily.          Allergies  Allergen Reactions  . Sulfonamide Derivatives     Review of Systems  Review of Systems  Constitutional: Positive for fever, chills and malaise/fatigue. Negative for weight loss.  HENT: Positive for ear pain and sore throat. Negative for congestion.   Eyes: Negative for discharge.  Respiratory: Positive for cough and sputum production. Negative for shortness of breath.   Cardiovascular: Negative for chest pain, palpitations and leg swelling.  Gastrointestinal: Negative for nausea, abdominal pain and diarrhea.  Genitourinary: Negative for dysuria.  Musculoskeletal: Negative for falls.  Skin: Negative for rash.  Neurological: Negative for loss of consciousness and headaches.  Endo/Heme/Allergies: Negative for polydipsia.  Psychiatric/Behavioral: Negative for depression and suicidal ideas. The patient is not nervous/anxious and does not have insomnia.     Objective  BP 127/80  Pulse 77  Temp(Src) 97.8 F (36.6 C) (Oral)  Ht 5' 4.5" (1.638 m)  Wt 142 lb (64.411 kg)  BMI 24.00 kg/m2  SpO2 100%  LMP 07/29/2011  Physical Exam  Physical Exam  Constitutional: She is oriented to person, place, and time and well-developed, well-nourished, and  in no distress. No distress.  HENT:  Head: Normocephalic and atraumatic.  Right Ear: External ear normal.  Left Ear: External ear normal.       Nasal mucosa is erythematous and boggy  Eyes: Conjunctivae are normal.  Neck: Neck supple. No thyromegaly present.  Cardiovascular: Normal rate, regular rhythm and normal heart sounds.   No murmur heard. Pulmonary/Chest: Effort normal and breath sounds normal. She has no wheezes.       Decreased breath sounds b/l bases  Abdominal: Soft. Bowel sounds are normal. She exhibits no distension and no mass.  Musculoskeletal: She exhibits no edema.  Lymphadenopathy:    She has no  cervical adenopathy.  Neurological: She is alert and oriented to person, place, and time.  Skin: Skin is warm and dry. No rash noted. She is not diaphoretic.  Psychiatric: Memory, affect and judgment normal.    Lab Results  Component Value Date   TSH 1.85 07/03/2010   Lab Results  Component Value Date   WBC 6.2 08/08/2011   HGB 11.9* 08/08/2011   HCT 36.6 08/08/2011   MCV 88.4 08/08/2011   PLT 293 08/08/2011   Lab Results  Component Value Date   CREATININE 0.8 07/03/2010   BUN 11 07/03/2010   NA 138 07/03/2010   K 4.3 07/03/2010   CL 106 07/03/2010   CO2 23 07/03/2010   Lab Results  Component Value Date   ALT 14 07/03/2010   AST 18 07/03/2010   ALKPHOS 54 07/03/2010   BILITOT 0.5 07/03/2010   Lab Results  Component Value Date   CHOL 177 07/03/2010   Lab Results  Component Value Date   HDL 48.20 07/03/2010   Lab Results  Component Value Date   LDLCALC 111* 07/03/2010   Lab Results  Component Value Date   TRIG 91.0 07/03/2010   Lab Results  Component Value Date   CHOLHDL 4 07/03/2010     Assessment & Plan  Bronchitis Increase po fluids and increase rest. Azithromycin and Mucinex.   LLQ pain Notes the pain occurs at roughly the same time each month in her cycle. May use NSAIDs prn and notify us if symptoms worsen

## 2011-08-11 NOTE — Assessment & Plan Note (Signed)
Increase po fluids and increase rest. Azithromycin and Mucinex.

## 2011-08-11 NOTE — Assessment & Plan Note (Signed)
Notes the pain occurs at roughly the same time each month in her cycle. May use NSAIDs prn and notify us if symptoms worsen

## 2011-09-12 ENCOUNTER — Encounter: Payer: Self-pay | Admitting: Family Medicine

## 2011-09-12 ENCOUNTER — Ambulatory Visit (INDEPENDENT_AMBULATORY_CARE_PROVIDER_SITE_OTHER): Payer: 59 | Admitting: Family Medicine

## 2011-09-12 DIAGNOSIS — D649 Anemia, unspecified: Secondary | ICD-10-CM

## 2011-09-12 DIAGNOSIS — L708 Other acne: Secondary | ICD-10-CM

## 2011-09-12 DIAGNOSIS — E782 Mixed hyperlipidemia: Secondary | ICD-10-CM

## 2011-09-12 DIAGNOSIS — K589 Irritable bowel syndrome without diarrhea: Secondary | ICD-10-CM

## 2011-09-12 DIAGNOSIS — Z0000 Encounter for general adult medical examination without abnormal findings: Secondary | ICD-10-CM

## 2011-09-12 DIAGNOSIS — G4700 Insomnia, unspecified: Secondary | ICD-10-CM

## 2011-09-12 DIAGNOSIS — R41840 Attention and concentration deficit: Secondary | ICD-10-CM

## 2011-09-12 DIAGNOSIS — F909 Attention-deficit hyperactivity disorder, unspecified type: Secondary | ICD-10-CM

## 2011-09-12 LAB — CBC
HCT: 37.5 % (ref 36.0–46.0)
Hemoglobin: 12.9 g/dL (ref 12.0–15.0)
Platelets: 272 10*3/uL (ref 150.0–400.0)
RDW: 13.6 % (ref 11.5–14.6)
WBC: 6.2 10*3/uL (ref 4.5–10.5)

## 2011-09-12 LAB — LIPID PANEL
Cholesterol: 169 mg/dL (ref 0–200)
HDL: 77.3 mg/dL (ref >39.00)
Triglycerides: 47 mg/dL (ref 0.0–149.0)

## 2011-09-12 LAB — RENAL FUNCTION PANEL
Albumin: 4.6 g/dL (ref 3.5–5.2)
BUN: 15 mg/dL (ref 6–23)
CO2: 27 mEq/L (ref 19–32)
Calcium: 9.2 mg/dL (ref 8.4–10.5)
Chloride: 107 mEq/L (ref 96–112)
Creatinine, Ser: 0.8 mg/dL (ref 0.4–1.2)
GFR: 89.55 mL/min (ref >60.00)

## 2011-09-12 LAB — HEPATIC FUNCTION PANEL
Albumin: 4.6 g/dL (ref 3.5–5.2)
Alkaline Phosphatase: 42 U/L (ref 39–117)
Total Protein: 7.6 g/dL (ref 6.0–8.3)

## 2011-09-12 MED ORDER — LISDEXAMFETAMINE DIMESYLATE 30 MG PO CAPS: ORAL_CAPSULE | ORAL | Status: DC

## 2011-09-12 MED ORDER — LISDEXAMFETAMINE DIMESYLATE 40 MG PO CAPS: ORAL_CAPSULE | ORAL | Status: DC

## 2011-09-12 NOTE — Patient Instructions (Signed)

## 2011-09-12 NOTE — Assessment & Plan Note (Signed)
Has been more tired lately but is not sleeping well and under a great deal of stress and is unsure whether if she wants to continue in school.

## 2011-09-12 NOTE — Assessment & Plan Note (Addendum)
Very mild, will repeat CBC today, consider iron supplements as needed

## 2011-09-12 NOTE — Progress Notes (Signed)
Bonnie Lewis [id number] December 14, 1985 09/12/2011      Progress Note New Patient  Subjective  Chief Complaint  Chief Complaint  Patient presents with  . Annual Exam    w/labs TSH, CBC, Lipid, Renal, Hepatic    HPI  Patient is a 25 year old Caucasian female in today for annual exam. She recently had a bout with bronchitis but reports that is resolved. Denies chest pain, palpitations, shortness of breath, congestion, headache, fevers, chills, GI or GU complaints. Her acne is responding to her recent changes. She has tolerated his recent cortisone shot without difficulty. He continues to get through school but is not as well she declined due to all of her stressors. Her finances not causing her any difficulty. Her dad appears to have early onset Alzheimer's and she is under a lot of stress there. He noticed fatigue and lack of motivation beginning to explore her but denies any severe depression or suicidal ideation. She has tried to maintain a healthier diet and increase her iron since her last set of blood work. She is not complaining of any bowel or back difficulties at this time.  Past Medical History  Diagnosis Date  . ADD (attention deficit disorder)   . Bronchitis 08/11/2011  . LLQ pain 08/11/2011    History reviewed. No pertinent past surgical history.  Family History  Problem Relation Age of Onset  . Asthma Father   . Depression Father     and anxiety  . Allergies Father   . Irritable bowel syndrome Father   . Depression Sister   . Irritable bowel syndrome Sister   . Migraines Sister   . Depression Brother   . Allergies Brother   . Depression Maternal Grandmother     Anxiety  . Hypertension Maternal Grandmother   . ADD / ADHD Maternal Grandmother   . Depression Paternal Grandmother   . Cardiomyopathy Paternal Grandmother   . Hyperlipidemia Mother   . Anemia Sister   . Coronary artery disease Paternal Grandfather     History   Social History  . Marital Status:  Single    Spouse Name: N/A    Number of Children: N/A  . Years of Education: N/A   Occupational History  . Not on file.   Social History Main Topics  . Smoking status: Never Smoker   . Smokeless tobacco: Never Used  . Alcohol Use: No  . Drug Use: No  . Sexually Active: Not on file   Other Topics Concern  . Not on file   Social History Narrative  . No narrative on file    Current Outpatient Prescriptions on File Prior to Visit  Medication Sig Dispense Refill  . AZELEX 20 % cream AFTER SKIN IS THOROUGHLY WASHED AND PATTED DRY MASSAGE CREAM INTO AFFECTED AREA IN AM AND PM  50 g  2  . Calcium Carbonate (CALCIUM 500 PO) Take by mouth daily.        . fish oil-omega-3 fatty acids 1000 MG capsule Take 1,000 mg by mouth daily.        Marland Kitchen lisdexamfetamine (VYVANSE) 30 MG capsule December 2012 rx  30 capsule  0  . lisdexamfetamine (VYVANSE) 40 MG capsule December 2012 rx  30 capsule  0  . Sulfacetamide Sodium-Sulfur (PRASCION AV CLEANSER) 10-5 % EMUL Apply topically 2 (two) times daily.          Allergies  Allergen Reactions  . Sulfonamide Derivatives     Review of Systems  Review of Systems  Constitutional:  Positive for malaise/fatigue. Negative for fever and chills.  HENT: Negative for hearing loss, nosebleeds and congestion.   Eyes: Negative for discharge.  Respiratory: Negative for cough, sputum production, shortness of breath and wheezing.   Cardiovascular: Negative for chest pain, palpitations and leg swelling.  Gastrointestinal: Negative for heartburn, nausea, vomiting, abdominal pain, diarrhea, constipation and blood in stool.  Genitourinary: Negative for dysuria, urgency, frequency and hematuria.  Musculoskeletal: Negative for myalgias, back pain and falls.  Skin: Negative for rash.  Neurological: Negative for dizziness, tremors, sensory change, focal weakness, loss of consciousness, weakness and headaches.  Endo/Heme/Allergies: Negative for polydipsia. Does not  bruise/bleed easily.  Psychiatric/Behavioral: Negative for depression and suicidal ideas. The patient has insomnia. The patient is not nervous/anxious.        Acknowledges being frustrated with school and worried about her father's health    Objective  BP 107/74  Pulse 67  Temp(Src) 98.4 F (36.9 C) (Oral)  Ht 5' 4.5" (1.638 m)  Wt 140 lb 12.8 oz (63.866 kg)  BMI 23.79 kg/m2  SpO2 98%  LMP 08/22/2011  Physical Exam  Physical Exam  Constitutional: She is oriented to person, place, and time and well-developed, well-nourished, and in no distress. No distress.  HENT:  Head: Normocephalic and atraumatic.  Right Ear: External ear normal.  Left Ear: External ear normal.  Nose: Nose normal.  Mouth/Throat: Oropharynx is clear and moist. No oropharyngeal exudate.  Eyes: Conjunctivae are normal. Pupils are equal, round, and reactive to light. Right eye exhibits no discharge. Left eye exhibits no discharge. No scleral icterus.  Neck: Normal range of motion. Neck supple. No thyromegaly present.  Cardiovascular: Normal rate, regular rhythm, normal heart sounds and intact distal pulses.   No murmur heard. Pulmonary/Chest: Effort normal and breath sounds normal. No respiratory distress. She has no wheezes. She has no rales.  Abdominal: Soft. Bowel sounds are normal. She exhibits no distension and no mass. There is no tenderness.  Musculoskeletal: Normal range of motion. She exhibits no edema and no tenderness.  Lymphadenopathy:    She has no cervical adenopathy.  Neurological: She is alert and oriented to person, place, and time. She has normal reflexes. No cranial nerve deficit. Coordination normal.  Skin: Skin is warm and dry. No rash noted. She is not diaphoretic.  Psychiatric: Mood, memory and affect normal.       Assessment & Plan  Anemia Very mild, will repeat CBC today, consider iron supplements as needed  Insomnia Getting poor sleep between studying late at night and is up  to be at class at 8:30 am. She is encouraged to try and improve sleep hygiene the best she can and she may try some Benadryl prn when she has difficulty falling asleep  ATTENTION OR CONCENTRATION DEFICIT Has been more tired lately but is not sleeping well and under a great deal of stress and is unsure whether if she wants to continue in school.  Mixed hyperlipidemia Very mild in past and presently resolved with weight loss and lifestyle modifications, is encouraged to continue  Dietary changes and fish oil supplements  IBS Improved largely with increased fiber and fluid intake, no changes, encouraged a daily yogurt as well  ACNE VULGARIS No complaints today, maintain current regimen  Preventative health care Patient not sexually active, will defer pap for now and check fasting labs. Encouraged heart healthy diet

## 2011-09-12 NOTE — Assessment & Plan Note (Addendum)
Getting poor sleep between studying late at night and is up to be at class at 8:30 am. She is encouraged to try and improve sleep hygiene the best she can and she may try some Benadryl prn when she has difficulty falling asleep

## 2011-09-13 ENCOUNTER — Other Ambulatory Visit: Payer: Self-pay | Admitting: Family Medicine

## 2011-09-16 ENCOUNTER — Encounter: Payer: Self-pay | Admitting: Family Medicine

## 2011-09-16 DIAGNOSIS — Z0000 Encounter for general adult medical examination without abnormal findings: Secondary | ICD-10-CM | POA: Insufficient documentation

## 2011-09-16 NOTE — Assessment & Plan Note (Addendum)
Patient not sexually active, will defer pap for now and check fasting labs. Encouraged heart healthy diet

## 2011-09-16 NOTE — Assessment & Plan Note (Signed)
Improved largely with increased fiber and fluid intake, no changes, encouraged a daily yogurt as well

## 2011-09-16 NOTE — Assessment & Plan Note (Signed)
Very mild in past and presently resolved with weight loss and lifestyle modifications, is encouraged to continue  Dietary changes and fish oil supplements

## 2011-09-16 NOTE — Assessment & Plan Note (Signed)
No complaints today, maintain current regimen

## 2011-10-04 ENCOUNTER — Encounter: Payer: Self-pay | Admitting: Family Medicine

## 2011-10-04 ENCOUNTER — Ambulatory Visit (INDEPENDENT_AMBULATORY_CARE_PROVIDER_SITE_OTHER): Payer: 59 | Admitting: Family Medicine

## 2011-10-04 VITALS — BP 118/81 | HR 84 | Ht 64.5 in | Wt 143.0 lb

## 2011-10-04 DIAGNOSIS — L989 Disorder of the skin and subcutaneous tissue, unspecified: Secondary | ICD-10-CM

## 2011-10-04 MED ORDER — MUPIROCIN 2 % EX OINT: TOPICAL_OINTMENT | Freq: Three times a day (TID) | CUTANEOUS | Status: AC

## 2011-10-04 MED ORDER — CEPHALEXIN 500 MG PO CAPS: 500.0000 mg | ORAL_CAPSULE | Freq: Three times a day (TID) | ORAL | Status: AC

## 2011-10-04 NOTE — Progress Notes (Signed)
OFFICE NOTE  10/04/2011  CC:  Chief Complaint  Patient presents with  . ringworm    on back of head, had bump there previously and scratched, used lotrimin last night     HPI: Patient is a 25 y.o. Caucasian female who is here for scalp lesion. Noted bump on back of scalp a few days ago, scratched at it a bit but it wasn't itchy really.  No pain until yesterday it started to feel a bit sore. No recent scalp issues like dryness or itchiness. No fevers, no myalgias, no wt loss, no rashes.  Pertinent PMH:  Negative for skin lesion/boil in the past Negative for scalp tinea.   +tinea corperis briefly in summer 2012  Pertinent Meds: Vyvanse  PE: Blood pressure 118/81, pulse 84, height 5' 4.5" (1.638 m), weight 143 lb (64.864 kg), last menstrual period 09/20/2011. Gen: Alert, well appearing.  Patient is oriented to person, place, time, and situation. Scalp: on occipital scalp area near the midline there is a pea sized light yellow colored plaque. Mildly tender to palpation.  No maceration, no pustule, no papule.  No surrounding erythema, no neck lymphadenopathy.  IMPRESSION AND PLAN:  Scalp lesion: localized bacterial scalp infection/folliculitis vs atypical tinea capitis.  Will treat as bacterial: bactroban ointment tid to the area x 10d.  Cephalexin 500mg  tid x 10d. Emphasized the importance of office f/u to document resolution and pt expressed understanding of this.  FOLLOW UP: 2 wks

## 2011-10-21 ENCOUNTER — Ambulatory Visit (INDEPENDENT_AMBULATORY_CARE_PROVIDER_SITE_OTHER): Payer: 59 | Admitting: Family Medicine

## 2011-10-21 ENCOUNTER — Encounter: Payer: Self-pay | Admitting: Family Medicine

## 2011-10-21 VITALS — BP 116/80 | HR 74 | Temp 99.5°F | Ht 64.5 in | Wt 144.1 lb

## 2011-10-21 DIAGNOSIS — R41840 Attention and concentration deficit: Secondary | ICD-10-CM

## 2011-10-21 DIAGNOSIS — F909 Attention-deficit hyperactivity disorder, unspecified type: Secondary | ICD-10-CM

## 2011-10-21 DIAGNOSIS — D649 Anemia, unspecified: Secondary | ICD-10-CM

## 2011-10-21 MED ORDER — LISDEXAMFETAMINE DIMESYLATE 30 MG PO CAPS: 30.0000 mg | ORAL_CAPSULE | Freq: Every day | ORAL | Status: DC

## 2011-10-21 MED ORDER — HEMOCYTE PLUS 106-1 MG PO CAPS: 1.0000 | ORAL_CAPSULE | Freq: Every day | ORAL | Status: DC

## 2011-10-21 MED ORDER — LISDEXAMFETAMINE DIMESYLATE 40 MG PO CAPS: 40.0000 mg | ORAL_CAPSULE | ORAL | Status: DC

## 2011-10-21 NOTE — Patient Instructions (Signed)
Attention Deficit Hyperactivity Disorder Attention deficit hyperactivity disorder (ADHD) is a problem with behavior issues based on the way the brain functions (neurobehavioral disorder). It is a common reason for behavior and academic problems in school. CAUSES  The cause of ADHD is unknown in most cases. It may run in families. It sometimes can be associated with learning disabilities and other behavioral problems. SYMPTOMS  There are 3 types of ADHD. The 3 types and some of the symptoms include:  Inattentive   Gets bored or distracted easily.   Loses or forgets things. Forgets to hand in homework.   Has trouble organizing or completing tasks.   Difficulty staying on task.   An inability to organize daily tasks and school work.   Leaving projects, chores, or homework unfinished.   Trouble paying attention or responding to details. Careless mistakes.   Difficulty following directions. Often seems like is not listening.   Dislikes activities that require sustained attention (like chores or homework).   Hyperactive-impulsive   Feels like it is impossible to sit still or stay in a seat. Fidgeting with hands and feet.   Trouble waiting turn.   Talking too much or out of turn. Interruptive.   Speaks or acts impulsively.   Aggressive, disruptive behavior.   Constantly busy or on the go, noisy.   Combined   Has symptoms of both of the above.  Often children with ADHD feel discouraged about themselves and with school. They often perform well below their abilities in school. These symptoms can cause problems in home, school, and in relationships with peers. As children get older, the excess motor activities can calm down, but the problems with paying attention and staying organized persist. Most children do not outgrow ADHD but with good treatment can learn to cope with the symptoms. DIAGNOSIS  When ADHD is suspected, the diagnosis should be made by professionals trained in  ADHD.  Diagnosis will include:  Ruling out other reasons for the child's behavior.   The caregivers will check with the child's school and check their medical records.   They will talk to teachers and parents.   Behavior rating scales for the child will be filled out by those dealing with the child on a daily basis.  A diagnosis is made only after all information has been considered. TREATMENT  Treatment usually includes behavioral treatment often along with medicines. It may include stimulant medicines. The stimulant medicines decrease impulsivity and hyperactivity and increase attention. Other medicines used include antidepressants and certain blood pressure medicines. Most experts agree that treatment for ADHD should address all aspects of the child's functioning. Treatment should not be limited to the use of medicines alone. Treatment should include structured classroom management. The parents must receive education to address rewarding good behavior, discipline, and limit-setting. Tutoring or behavioral therapy or both should be available for the child. If untreated, the disorder can have long-term serious effects into adolescence and adulthood. HOME CARE INSTRUCTIONS   Often with ADHD there is a lot of frustration among the family in dealing with the illness. There is often blame and anger that is not warranted. This is a life long illness. There is no way to prevent ADHD. In many cases, because the problem affects the family as a whole, the entire family may need help. A therapist can help the family find better ways to handle the disruptive behaviors and promote change. If the child is young, most of the therapist's work is with the parents. Parents will   learn techniques for coping with and improving their child's behavior. Sometimes only the child with the ADHD needs counseling. Your caregivers can help you make these decisions.   Children with ADHD may need help in organizing. Some  helpful tips include:   Keep routines the same every day from wake-up time to bedtime. Schedule everything. This includes homework and playtime. This should include outdoor and indoor recreation. Keep the schedule on the refrigerator or a bulletin board where it is frequently seen. Mark schedule changes as far in advance as possible.   Have a place for everything and keep everything in its place. This includes clothing, backpacks, and school supplies.   Encourage writing down assignments and bringing home needed books.   Offer your child a well-balanced diet. Breakfast is especially important for school performance. Children should avoid drinks with caffeine including:   Soft drinks.   Coffee.   Tea.   However, some older children (adolescents) may find these drinks helpful in improving their attention.   Children with ADHD need consistent rules that they can understand and follow. If rules are followed, give small rewards. Children with ADHD often receive, and expect, criticism. Look for good behavior and praise it. Set realistic goals. Give clear instructions. Look for activities that can foster success and self-esteem. Make time for pleasant activities with your child. Give lots of affection.   Parents are their children's greatest advocates. Learn as much as possible about ADHD. This helps you become a stronger and better advocate for your child. It also helps you educate your child's teachers and instructors if they feel inadequate in these areas. Parent support groups are often helpful. A national group with local chapters is called CHADD (Children and Adults with Attention Deficit Hyperactivity Disorder).  PROGNOSIS  There is no cure for ADHD. Children with the disorder seldom outgrow it. Many find adaptive ways to accommodate the ADHD as they mature. SEEK MEDICAL CARE IF:  Your child has repeated muscle twitches, cough or speech outbursts.   Your child has sleep problems.   Your  child has a marked loss of appetite.   Your child develops depression.   Your child has new or worsening behavioral problems.   Your child develops dizziness.   Your child has a racing heart.   Your child has stomach pains.   Your child develops headaches.  Document Released: 09/20/2002 Document Revised: 06/12/2011 Document Reviewed: 05/02/2008 [employer] Patient Information 2012 [employer]. 

## 2011-10-27 ENCOUNTER — Encounter: Payer: Self-pay | Admitting: Family Medicine

## 2011-10-27 NOTE — Progress Notes (Signed)
Patient ID: Bonnie Lewis, female   DOB: [birth year], 26 y.o.   MRN: [id number] Camyla Camposano [id number] Apr 03, 1986 10/27/2011      Progress Note-Follow Up  Subjective  Chief Complaint  Chief Complaint  Patient presents with  . Follow-up    2 week follow up    HPI  Patient is a 26 yo Caucasian female who was recently in with URI symptoms which she reports are resolved. She denies any congestion, HA, sore throat, fevers, chills, cp, palp, sob, gi or gu c/o  Past Medical History  Diagnosis Date  . ADD (attention deficit disorder)   . Bronchitis 08/11/2011  . LLQ pain 08/11/2011  . Anemia 09/12/2011  . Insomnia 09/12/2011  . Preventative health care 09/16/2011    History reviewed. No pertinent past surgical history.  Family History  Problem Relation Age of Onset  . Asthma Father   . Depression Father     and anxiety  . Allergies Father   . Irritable bowel syndrome Father   . Depression Sister   . Irritable bowel syndrome Sister   . Migraines Sister   . Depression Brother   . Allergies Brother   . Depression Maternal Grandmother     Anxiety  . Hypertension Maternal Grandmother   . ADD / ADHD Maternal Grandmother   . Depression Paternal Grandmother   . Cardiomyopathy Paternal Grandmother   . Hyperlipidemia Mother   . Anemia Sister   . Coronary artery disease Paternal Grandfather     History   Social History  . Marital Status: Single    Spouse Name: N/A    Number of Children: N/A  . Years of Education: N/A   Occupational History  . Not on file.   Social History Main Topics  . Smoking status: Never Smoker   . Smokeless tobacco: Never Used  . Alcohol Use: No  . Drug Use: No  . Sexually Active: Not on file   Other Topics Concern  . Not on file   Social History Narrative  . No narrative on file    Current Outpatient Prescriptions on File Prior to Visit  Medication Sig Dispense Refill  . AZELEX 20 % cream AFTER SKIN IS THOROUGHLY WASHED AND PATTED DRY  MASSAGE CREAM INTO AFFECTED AREA IN AM AND PM  50 g  2  . Calcium Carbonate (CALCIUM 500 PO) Take by mouth daily.        . fish oil-omega-3 fatty acids 1000 MG capsule Take 1,000 mg by mouth daily.        . Sulfacetamide Sodium-Sulfur (PRASCION AV CLEANSER) 10-5 % EMUL Apply topically 2 (two) times daily.          Allergies  Allergen Reactions  . Sulfonamide Derivatives     Review of Systems  Review of Systems  Constitutional: Negative for fever and malaise/fatigue.  HENT: Negative for congestion.   Eyes: Negative for discharge.  Respiratory: Negative for shortness of breath.   Cardiovascular: Negative for chest pain, palpitations and leg swelling.  Gastrointestinal: Negative for nausea, abdominal pain and diarrhea.  Genitourinary: Negative for dysuria.  Musculoskeletal: Negative for falls.  Skin: Negative for rash.  Neurological: Negative for loss of consciousness and headaches.  Endo/Heme/Allergies: Negative for polydipsia.  Psychiatric/Behavioral: Negative for depression and suicidal ideas. The patient is not nervous/anxious and does not have insomnia.     Objective  BP 116/80  Pulse 74  Temp(Src) 99.5 F (37.5 C) (Temporal)  Ht 5' 4.5" (1.638 m)  Wt 144 lb 1.9  oz (65.372 kg)  BMI 24.36 kg/m2  SpO2 98%  LMP 10/17/2011  Physical Exam  Physical Exam  Constitutional: She is oriented to person, place, and time and well-developed, well-nourished, and in no distress. No distress.  HENT:  Head: Normocephalic and atraumatic.  Eyes: Conjunctivae are normal.  Neck: Neck supple. No thyromegaly present.  Cardiovascular: Normal rate, regular rhythm and normal heart sounds.   No murmur heard. Pulmonary/Chest: Effort normal and breath sounds normal. She has no wheezes.  Abdominal: She exhibits no distension and no mass.  Musculoskeletal: She exhibits no edema.  Lymphadenopathy:    She has no cervical adenopathy.  Neurological: She is alert and oriented to person, place, and  time.  Skin: Skin is warm and dry. No rash noted. She is not diaphoretic.  Psychiatric: Memory, affect and judgment normal.    Lab Results  Component Value Date   TSH 2.17 09/12/2011   Lab Results  Component Value Date   WBC 6.2 09/12/2011   HGB 12.9 09/12/2011   HCT 37.5 09/12/2011   MCV 86.5 09/12/2011   PLT 272.0 09/12/2011   Lab Results  Component Value Date   CREATININE 0.8 09/12/2011   BUN 15 09/12/2011   NA 139 09/12/2011   K 3.9 09/12/2011   CL 107 09/12/2011   CO2 27 09/12/2011   Lab Results  Component Value Date   ALT 15 09/12/2011   AST 20 09/12/2011   ALKPHOS 42 09/12/2011   BILITOT 0.8 09/12/2011   Lab Results  Component Value Date   CHOL 169 09/12/2011   Lab Results  Component Value Date   HDL 77.30 09/12/2011   Lab Results  Component Value Date   LDLCALC 82 09/12/2011   Lab Results  Component Value Date   TRIG 47.0 09/12/2011   Lab Results  Component Value Date   CHOLHDL 2 09/12/2011     Assessment & Plan  ATTENTION OR CONCENTRATION DEFICIT Patient doing well on current dosing of Vyvanse, will continue this for now. She was in recently with URI symptoms and patient reports these symptoms have completely resolved.

## 2011-11-01 NOTE — Assessment & Plan Note (Addendum)
Patient doing well on current dosing of Vyvanse, will continue this for now. She was in recently with URI symptoms and patient reports these symptoms have completely resolved.

## 2011-12-09 ENCOUNTER — Other Ambulatory Visit: Payer: Self-pay | Admitting: Family Medicine

## 2012-01-08 ENCOUNTER — Encounter: Payer: Self-pay | Admitting: Family Medicine

## 2012-01-09 ENCOUNTER — Encounter: Payer: Self-pay | Admitting: Family Medicine

## 2012-01-09 ENCOUNTER — Ambulatory Visit (INDEPENDENT_AMBULATORY_CARE_PROVIDER_SITE_OTHER): Payer: 59 | Admitting: Family Medicine

## 2012-01-09 VITALS — BP 112/76 | HR 70 | Temp 98.8°F | Ht 64.5 in | Wt 146.8 lb

## 2012-01-09 DIAGNOSIS — F909 Attention-deficit hyperactivity disorder, unspecified type: Secondary | ICD-10-CM

## 2012-01-09 DIAGNOSIS — E782 Mixed hyperlipidemia: Secondary | ICD-10-CM

## 2012-01-09 DIAGNOSIS — L708 Other acne: Secondary | ICD-10-CM

## 2012-01-09 DIAGNOSIS — R41840 Attention and concentration deficit: Secondary | ICD-10-CM

## 2012-01-09 DIAGNOSIS — M549 Dorsalgia, unspecified: Secondary | ICD-10-CM

## 2012-01-09 MED ORDER — LISDEXAMFETAMINE DIMESYLATE 30 MG PO CAPS: 30.0000 mg | ORAL_CAPSULE | ORAL | Status: DC

## 2012-01-09 MED ORDER — LISDEXAMFETAMINE DIMESYLATE 40 MG PO CAPS: ORAL_CAPSULE | ORAL | Status: DC

## 2012-01-09 MED ORDER — LISDEXAMFETAMINE DIMESYLATE 40 MG PO CAPS: 40.0000 mg | ORAL_CAPSULE | ORAL | Status: DC

## 2012-01-09 MED ORDER — LISDEXAMFETAMINE DIMESYLATE 30 MG PO CAPS: ORAL_CAPSULE | ORAL | Status: DC

## 2012-01-09 NOTE — Patient Instructions (Signed)
Attention Deficit Hyperactivity Disorder Attention deficit hyperactivity disorder (ADHD) is a problem with behavior issues based on the way the brain functions (neurobehavioral disorder). It is a common reason for behavior and academic problems in school. CAUSES  The cause of ADHD is unknown in most cases. It may run in families. It sometimes can be associated with learning disabilities and other behavioral problems. SYMPTOMS  There are 3 types of ADHD. The 3 types and some of the symptoms include:  Inattentive   Gets bored or distracted easily.   Loses or forgets things. Forgets to hand in homework.   Has trouble organizing or completing tasks.   Difficulty staying on task.   An inability to organize daily tasks and school work.   Leaving projects, chores, or homework unfinished.   Trouble paying attention or responding to details. Careless mistakes.   Difficulty following directions. Often seems like is not listening.   Dislikes activities that require sustained attention (like chores or homework).   Hyperactive-impulsive   Feels like it is impossible to sit still or stay in a seat. Fidgeting with hands and feet.   Trouble waiting turn.   Talking too much or out of turn. Interruptive.   Speaks or acts impulsively.   Aggressive, disruptive behavior.   Constantly busy or on the go, noisy.   Combined   Has symptoms of both of the above.  Often children with ADHD feel discouraged about themselves and with school. They often perform well below their abilities in school. These symptoms can cause problems in home, school, and in relationships with peers. As children get older, the excess motor activities can calm down, but the problems with paying attention and staying organized persist. Most children do not outgrow ADHD but with good treatment can learn to cope with the symptoms. DIAGNOSIS  When ADHD is suspected, the diagnosis should be made by professionals trained in  ADHD.  Diagnosis will include:  Ruling out other reasons for the child's behavior.   The caregivers will check with the child's school and check their medical records.   They will talk to teachers and parents.   Behavior rating scales for the child will be filled out by those dealing with the child on a daily basis.  A diagnosis is made only after all information has been considered. TREATMENT  Treatment usually includes behavioral treatment often along with medicines. It may include stimulant medicines. The stimulant medicines decrease impulsivity and hyperactivity and increase attention. Other medicines used include antidepressants and certain blood pressure medicines. Most experts agree that treatment for ADHD should address all aspects of the child's functioning. Treatment should not be limited to the use of medicines alone. Treatment should include structured classroom management. The parents must receive education to address rewarding good behavior, discipline, and limit-setting. Tutoring or behavioral therapy or both should be available for the child. If untreated, the disorder can have long-term serious effects into adolescence and adulthood. HOME CARE INSTRUCTIONS   Often with ADHD there is a lot of frustration among the family in dealing with the illness. There is often blame and anger that is not warranted. This is a life long illness. There is no way to prevent ADHD. In many cases, because the problem affects the family as a whole, the entire family may need help. A therapist can help the family find better ways to handle the disruptive behaviors and promote change. If the child is young, most of the therapist's work is with the parents. Parents will   learn techniques for coping with and improving their child's behavior. Sometimes only the child with the ADHD needs counseling. Your caregivers can help you make these decisions.   Children with ADHD may need help in organizing. Some  helpful tips include:   Keep routines the same every day from wake-up time to bedtime. Schedule everything. This includes homework and playtime. This should include outdoor and indoor recreation. Keep the schedule on the refrigerator or a bulletin board where it is frequently seen. Mark schedule changes as far in advance as possible.   Have a place for everything and keep everything in its place. This includes clothing, backpacks, and school supplies.   Encourage writing down assignments and bringing home needed books.   Offer your child a well-balanced diet. Breakfast is especially important for school performance. Children should avoid drinks with caffeine including:   Soft drinks.   Coffee.   Tea.   However, some older children (adolescents) may find these drinks helpful in improving their attention.   Children with ADHD need consistent rules that they can understand and follow. If rules are followed, give small rewards. Children with ADHD often receive, and expect, criticism. Look for good behavior and praise it. Set realistic goals. Give clear instructions. Look for activities that can foster success and self-esteem. Make time for pleasant activities with your child. Give lots of affection.   Parents are their children's greatest advocates. Learn as much as possible about ADHD. This helps you become a stronger and better advocate for your child. It also helps you educate your child's teachers and instructors if they feel inadequate in these areas. Parent support groups are often helpful. A national group with local chapters is called CHADD (Children and Adults with Attention Deficit Hyperactivity Disorder).  PROGNOSIS  There is no cure for ADHD. Children with the disorder seldom outgrow it. Many find adaptive ways to accommodate the ADHD as they mature. SEEK MEDICAL CARE IF:  Your child has repeated muscle twitches, cough or speech outbursts.   Your child has sleep problems.   Your  child has a marked loss of appetite.   Your child develops depression.   Your child has new or worsening behavioral problems.   Your child develops dizziness.   Your child has a racing heart.   Your child has stomach pains.   Your child develops headaches.  Document Released: 09/20/2002 Document Revised: 09/19/2011 Document Reviewed: 05/02/2008 [employer] Patient Information 2012 [employer]. 

## 2012-01-15 ENCOUNTER — Encounter: Payer: Self-pay | Admitting: Family Medicine

## 2012-01-15 NOTE — Progress Notes (Signed)
Patient ID: Bonnie Lewis, female   DOB: 16-Sep-1986, 26 y.o.   MRN: [id number] Bonnie Lewis [id number] 30-May-1986 01/15/2012      Progress Note-Follow Up  Subjective  Chief Complaint  Chief Complaint  Patient presents with  . Follow-up    3 month follow up    HPI  Patient is a 26 year old Caucasian female who is in today for routine followup. Overall she reports doing well. Vyvanse continues to help in  In divided doses. She denies any headache, chest pain, palpitations, shortness of breath, GI or GU complaints. No significant back pain recently. No abdominal pain. She is sleeping relatively well.   Past Medical History  Diagnosis Date  . ADD (attention deficit disorder)   . Bronchitis 08/11/2011  . LLQ pain 08/11/2011  . Anemia 09/12/2011  . Insomnia 09/12/2011  . Preventative health care 09/16/2011    History reviewed. No pertinent past surgical history.  Family History  Problem Relation Age of Onset  . Asthma Father   . Depression Father     and anxiety  . Allergies Father   . Irritable bowel syndrome Father   . Depression Sister   . Irritable bowel syndrome Sister   . Migraines Sister   . Depression Brother   . Allergies Brother   . Depression Maternal Grandmother     Anxiety  . Hypertension Maternal Grandmother   . ADD / ADHD Maternal Grandmother   . Depression Paternal Grandmother   . Cardiomyopathy Paternal Grandmother   . Hyperlipidemia Mother   . Anemia Sister   . Coronary artery disease Paternal Grandfather     History   Social History  . Marital Status: Single    Spouse Name: N/A    Number of Children: N/A  . Years of Education: N/A   Occupational History  . Not on file.   Social History Main Topics  . Smoking status: Never Smoker   . Smokeless tobacco: Never Used  . Alcohol Use: No  . Drug Use: No  . Sexually Active: Not on file   Other Topics Concern  . Not on file   Social History Narrative  . No narrative on file    Current  Outpatient Prescriptions on File Prior to Visit  Medication Sig Dispense Refill  . AZELEX 20 % cream AFTER SKIN IS THOROUGHLY WASHED AND PATTED DRY MASSAGE CREAM INTO AFFECTED AREA IN AM AND PM  50 g  2  . B Complex-C-Min-Fe-FA (HEMOCYTE PLUS) 106-1 MG CAPS Take 1 capsule by mouth daily.  30 each  3  . Calcium Carbonate (CALCIUM 500 PO) Take by mouth daily.        . fish oil-omega-3 fatty acids 1000 MG capsule Take 1,000 mg by mouth daily.        . Sulfacetamide Sodium-Sulfur (PRASCION AV CLEANSER) 10-5 % EMUL Apply topically 2 (two) times daily.        Marland Kitchen lisdexamfetamine (VYVANSE) 30 MG capsule Take 1 capsule (30 mg total) by mouth every morning.  30 capsule  0  . lisdexamfetamine (VYVANSE) 30 MG capsule Take 1 capsule (30 mg total) by mouth every morning.  30 capsule  0  . lisdexamfetamine (VYVANSE) 40 MG capsule Take 1 capsule (40 mg total) by mouth every morning.  30 capsule  0  . lisdexamfetamine (VYVANSE) 40 MG capsule Take 1 capsule (40 mg total) by mouth every morning.  30 capsule  0    Allergies  Allergen Reactions  . Sulfonamide Derivatives  Review of Systems  Review of Systems  Constitutional: Negative for fever and malaise/fatigue.  HENT: Negative for congestion.   Eyes: Negative for discharge.  Respiratory: Negative for shortness of breath.   Cardiovascular: Negative for chest pain, palpitations and leg swelling.  Gastrointestinal: Negative for nausea, abdominal pain and diarrhea.  Genitourinary: Negative for dysuria.  Musculoskeletal: Negative for falls.  Skin: Negative for rash.  Neurological: Negative for loss of consciousness and headaches.  Endo/Heme/Allergies: Negative for polydipsia.  Psychiatric/Behavioral: Negative for depression and suicidal ideas. The patient is not nervous/anxious and does not have insomnia.     Objective  BP 112/76  Pulse 70  Temp(Src) 98.8 F (37.1 C) (Temporal)  Ht 5' 4.5" (1.638 m)  Wt 146 lb 12.8 oz (66.588 kg)  BMI 24.81  kg/m2  SpO2 98%  LMP 12/13/2011  Physical Exam  Physical Exam  Constitutional: She is oriented to person, place, and time and well-developed, well-nourished, and in no distress. No distress.  HENT:  Head: Normocephalic and atraumatic.  Eyes: Conjunctivae are normal.  Neck: Neck supple. No thyromegaly present.  Cardiovascular: Normal rate, regular rhythm and normal heart sounds.   No murmur heard. Pulmonary/Chest: Effort normal and breath sounds normal. She has no wheezes.  Abdominal: She exhibits no distension and no mass.  Musculoskeletal: She exhibits no edema.  Lymphadenopathy:    She has no cervical adenopathy.  Neurological: She is alert and oriented to person, place, and time.  Skin: Skin is warm and dry. No rash noted. She is not diaphoretic.  Psychiatric: Memory, affect and judgment normal.    Lab Results  Component Value Date   TSH 2.17 09/12/2011   Lab Results  Component Value Date   WBC 6.2 09/12/2011   HGB 12.9 09/12/2011   HCT 37.5 09/12/2011   MCV 86.5 09/12/2011   PLT 272.0 09/12/2011   Lab Results  Component Value Date   CREATININE 0.8 09/12/2011   BUN 15 09/12/2011   NA 139 09/12/2011   K 3.9 09/12/2011   CL 107 09/12/2011   CO2 27 09/12/2011   Lab Results  Component Value Date   ALT 15 09/12/2011   AST 20 09/12/2011   ALKPHOS 42 09/12/2011   BILITOT 0.8 09/12/2011   Lab Results  Component Value Date   CHOL 169 09/12/2011   Lab Results  Component Value Date   HDL 77.30 09/12/2011   Lab Results  Component Value Date   LDLCALC 82 09/12/2011   Lab Results  Component Value Date   TRIG 47.0 09/12/2011   Lab Results  Component Value Date   CHOLHDL 2 09/12/2011     Assessment & Plan    Mixed hyperlipidemia Mild, avoid trans fats and recheck every few years  ACNE VULGARIS Minimal difficulty with current meds  BACK PAIN Doing well with weight loss, no change  ATTENTION OR CONCENTRATION DEFICIT Doing well on  vyvanse

## 2012-01-16 ENCOUNTER — Ambulatory Visit: Payer: 59 | Admitting: Family Medicine

## 2012-01-20 NOTE — Assessment & Plan Note (Signed)
Mild, avoid trans fats and recheck every few years

## 2012-01-20 NOTE — Assessment & Plan Note (Signed)
Minimal difficulty with current meds

## 2012-01-20 NOTE — Assessment & Plan Note (Signed)
Doing well with weight loss, no change

## 2012-01-20 NOTE — Assessment & Plan Note (Signed)
Doing well on vyvanse.   

## 2012-03-11 ENCOUNTER — Other Ambulatory Visit: Payer: Self-pay

## 2012-03-11 MED ORDER — AZELAIC ACID 20 % EX CREA: TOPICAL_CREAM | CUTANEOUS | Status: DC

## 2012-03-12 ENCOUNTER — Ambulatory Visit: Payer: 59 | Admitting: Family Medicine

## 2012-05-07 ENCOUNTER — Other Ambulatory Visit: Payer: Self-pay

## 2012-05-07 NOTE — Telephone Encounter (Signed)
Pt left a message stating she needed a refill for Vyvanse and her face wash. I left a message for patient to return my call because an RX was given to patient for Vyvanse (Aug)

## 2012-05-08 MED ORDER — LISDEXAMFETAMINE DIMESYLATE 30 MG PO CAPS: 30.0000 mg | ORAL_CAPSULE | ORAL | Status: DC

## 2012-05-08 MED ORDER — LISDEXAMFETAMINE DIMESYLATE 40 MG PO CAPS: 40.0000 mg | ORAL_CAPSULE | ORAL | Status: DC

## 2012-05-08 MED ORDER — SULFACETAMIDE SODIUM-SULFUR 10-5 % EX EMUL: CUTANEOUS | Status: DC

## 2012-05-08 NOTE — Telephone Encounter (Signed)
The RX for Vyvanse was April not Aug. Is this ok to refill?  Verbal ok per MD to refill all 3 medications.

## 2012-05-08 NOTE — Telephone Encounter (Signed)
When looking again the last 3 RX's for Vyvanse say April 2013, July 2013 and Aug 2013. Please advise?

## 2012-05-08 NOTE — Telephone Encounter (Signed)
RX's printed and put in front cabinet for pt to pick up at 3 pm today

## 2012-05-08 NOTE — Telephone Encounter (Signed)
OK to refill meds for next 3 months

## 2012-07-10 ENCOUNTER — Encounter: Payer: 59 | Admitting: Family Medicine

## 2012-07-10 ENCOUNTER — Telehealth: Payer: Self-pay | Admitting: Family Medicine

## 2012-07-10 MED ORDER — LISDEXAMFETAMINE DIMESYLATE 30 MG PO CAPS: 30.0000 mg | ORAL_CAPSULE | ORAL | Status: DC

## 2012-07-10 MED ORDER — LISDEXAMFETAMINE DIMESYLATE 40 MG PO CAPS: 40.0000 mg | ORAL_CAPSULE | ORAL | Status: DC

## 2012-07-10 NOTE — Telephone Encounter (Signed)
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

## 2012-07-10 NOTE — Telephone Encounter (Signed)
Please advise 

## 2012-07-10 NOTE — Telephone Encounter (Signed)
Pt informed and voiced understanding, and RX's are at the front desk

## 2012-07-10 NOTE — Telephone Encounter (Signed)
Patient's insurance has changed & she has a $3,000 deductible. Does she have to keep today's appts to get her refills? Please call.

## 2012-07-27 ENCOUNTER — Other Ambulatory Visit: Payer: Self-pay

## 2012-07-27 MED ORDER — AZELAIC ACID 20 % EX CREA: TOPICAL_CREAM | CUTANEOUS | Status: DC

## 2012-08-11 ENCOUNTER — Other Ambulatory Visit: Payer: Self-pay

## 2012-08-11 MED ORDER — SULFACETAMIDE SODIUM-SULFUR 10-5 % EX EMUL: CUTANEOUS | Status: DC

## 2012-10-19 ENCOUNTER — Encounter: Payer: Self-pay | Admitting: Family Medicine

## 2012-10-19 ENCOUNTER — Other Ambulatory Visit (HOSPITAL_COMMUNITY)
Admission: RE | Admit: 2012-10-19 | Discharge: 2012-10-19 | Disposition: A | Discharge status: Home / Self Care | Payer: 59 | Source: Ambulatory Visit | Attending: Family Medicine | Admitting: Family Medicine

## 2012-10-19 ENCOUNTER — Ambulatory Visit (INDEPENDENT_AMBULATORY_CARE_PROVIDER_SITE_OTHER): Payer: 59 | Admitting: Family Medicine

## 2012-10-19 VITALS — BP 120/76 | HR 78 | Temp 98.0°F | Ht 64.5 in | Wt 151.8 lb

## 2012-10-19 DIAGNOSIS — N760 Acute vaginitis: Secondary | ICD-10-CM | POA: Insufficient documentation

## 2012-10-19 DIAGNOSIS — N946 Dysmenorrhea, unspecified: Secondary | ICD-10-CM

## 2012-10-19 DIAGNOSIS — Z124 Encounter for screening for malignant neoplasm of cervix: Secondary | ICD-10-CM | POA: Insufficient documentation

## 2012-10-19 DIAGNOSIS — N920 Excessive and frequent menstruation with regular cycle: Secondary | ICD-10-CM

## 2012-10-19 DIAGNOSIS — L708 Other acne: Secondary | ICD-10-CM

## 2012-10-19 DIAGNOSIS — C539 Malignant neoplasm of cervix uteri, unspecified: Secondary | ICD-10-CM

## 2012-10-19 DIAGNOSIS — Z23 Encounter for immunization: Secondary | ICD-10-CM

## 2012-10-19 DIAGNOSIS — N921 Excessive and frequent menstruation with irregular cycle: Secondary | ICD-10-CM

## 2012-10-19 DIAGNOSIS — D649 Anemia, unspecified: Secondary | ICD-10-CM

## 2012-10-19 DIAGNOSIS — Z01419 Encounter for gynecological examination (general) (routine) without abnormal findings: Secondary | ICD-10-CM | POA: Insufficient documentation

## 2012-10-19 DIAGNOSIS — F39 Unspecified mood [affective] disorder: Secondary | ICD-10-CM

## 2012-10-19 DIAGNOSIS — E785 Hyperlipidemia, unspecified: Secondary | ICD-10-CM

## 2012-10-19 DIAGNOSIS — Z0000 Encounter for general adult medical examination without abnormal findings: Secondary | ICD-10-CM

## 2012-10-19 DIAGNOSIS — M2140 Flat foot [pes planus] (acquired), unspecified foot: Secondary | ICD-10-CM

## 2012-10-19 DIAGNOSIS — F338 Other recurrent depressive disorders: Secondary | ICD-10-CM

## 2012-10-19 HISTORY — DX: Other recurrent depressive disorders: F33.8

## 2012-10-19 LAB — LIPID PANEL
HDL: 91.4 mg/dL (ref >39.00)
Triglycerides: 57 mg/dL (ref 0.0–149.0)
VLDL: 11.4 mg/dL (ref 0.0–40.0)

## 2012-10-19 LAB — TSH: TSH: 1.42 u[IU]/mL (ref 0.35–5.50)

## 2012-10-19 LAB — HEPATIC FUNCTION PANEL
ALT: 25 U/L (ref 0–35)
Total Bilirubin: 0.9 mg/dL (ref 0.3–1.2)
Total Protein: 7.4 g/dL (ref 6.0–8.3)

## 2012-10-19 LAB — RENAL FUNCTION PANEL
Calcium: 9.4 mg/dL (ref 8.4–10.5)
Creatinine, Ser: 0.9 mg/dL (ref 0.4–1.2)
Glucose, Bld: 87 mg/dL (ref 70–99)
Sodium: 138 mEq/L (ref 135–145)

## 2012-10-19 MED ORDER — AZELAIC ACID 20 % EX CREA: TOPICAL_CREAM | CUTANEOUS | Status: DC

## 2012-10-19 MED ORDER — LISDEXAMFETAMINE DIMESYLATE 40 MG PO CAPS: 40.0000 mg | ORAL_CAPSULE | ORAL | Status: DC

## 2012-10-19 MED ORDER — SULFACETAMIDE SODIUM-SULFUR 10-5 % EX EMUL: CUTANEOUS | Status: DC

## 2012-10-19 MED ORDER — LISDEXAMFETAMINE DIMESYLATE 30 MG PO CAPS: 30.0000 mg | ORAL_CAPSULE | ORAL | Status: DC

## 2012-10-19 NOTE — Assessment & Plan Note (Addendum)
Patient is requesting a prescription for lights to helps she is struggling with low mood and lives in a basement with low light. Her mother struggles with SAD and has done well with the lights. Will write a prescription

## 2012-10-19 NOTE — Patient Instructions (Addendum)
Preventive Care for Adults, Female A healthy lifestyle and preventive care can promote health and wellness. Preventive health guidelines for women include the following key practices.  A routine yearly physical is a good way to check with your caregiver about your health and preventive screening. It is a chance to share any concerns and updates on your health, and to receive a thorough exam.  Visit your dentist for a routine exam and preventive care every 6 months. Brush your teeth twice a day and floss once a day. Good oral hygiene prevents tooth decay and gum disease.  The frequency of eye exams is based on your age, health, family medical history, use of contact lenses, and other factors. Follow your caregiver's recommendations for frequency of eye exams.  Eat a healthy diet. Foods like vegetables, fruits, whole grains, low-fat dairy products, and lean protein foods contain the nutrients you need without too many calories. Decrease your intake of foods high in solid fats, added sugars, and salt. Eat the right amount of calories for you.Get information about a proper diet from your caregiver, if necessary.  Regular physical exercise is one of the most important things you can do for your health. Most adults should get at least 150 minutes of moderate-intensity exercise (any activity that increases your heart rate and causes you to sweat) each week. In addition, most adults need muscle-strengthening exercises on 2 or more days a week.  Maintain a healthy weight. The body mass index (BMI) is a screening tool to identify possible weight problems. It provides an estimate of body fat based on height and weight. Your caregiver can help determine your BMI, and can help you achieve or maintain a healthy weight.For adults 20 years and older:  A BMI below 18.5 is considered underweight.  A BMI of 18.5 to 24.9 is normal.  A BMI of 25 to 29.9 is considered overweight.  A BMI of 30 and above is  considered obese.  Maintain normal blood lipids and cholesterol levels by exercising and minimizing your intake of saturated fat. Eat a balanced diet with plenty of fruit and vegetables. Blood tests for lipids and cholesterol should begin at age 20 and be repeated every 5 years. If your lipid or cholesterol levels are high, you are over 50, or you are at high risk for heart disease, you may need your cholesterol levels checked more frequently.Ongoing high lipid and cholesterol levels should be treated with medicines if diet and exercise are not effective.  If you smoke, find out from your caregiver how to quit. If you do not use tobacco, do not start.  If you are pregnant, do not drink alcohol. If you are breastfeeding, be very cautious about drinking alcohol. If you are not pregnant and choose to drink alcohol, do not exceed 1 drink per day. One drink is considered to be 12 ounces (355 mL) of beer, 5 ounces (148 mL) of wine, or 1.5 ounces (44 mL) of liquor.  Avoid use of street drugs. Do not share needles with anyone. Ask for help if you need support or instructions about stopping the use of drugs.  High blood pressure causes heart disease and increases the risk of stroke. Your blood pressure should be checked at least every 1 to 2 years. Ongoing high blood pressure should be treated with medicines if weight loss and exercise are not effective.  If you are 55 to 26 years old, ask your caregiver if you should take aspirin to prevent strokes.  Diabetes   screening involves taking a blood sample to check your fasting blood sugar level. This should be done once every 3 years, after age 45, if you are within normal weight and without risk factors for diabetes. Testing should be considered at a younger age or be carried out more frequently if you are overweight and have at least 1 risk factor for diabetes.  Breast cancer screening is essential preventive care for women. You should practice "breast  self-awareness." This means understanding the normal appearance and feel of your breasts and may include breast self-examination. Any changes detected, no matter how small, should be reported to a caregiver. Women in their 20s and 30s should have a clinical breast exam (CBE) by a caregiver as part of a regular health exam every 1 to 3 years. After age 40, women should have a CBE every year. Starting at age 40, women should consider having a mammography (breast X-ray test) every year. Women who have a family history of breast cancer should talk to their caregiver about genetic screening. Women at a high risk of breast cancer should talk to their caregivers about having magnetic resonance imaging (MRI) and a mammography every year.  The Pap test is a screening test for cervical cancer. A Pap test can show cell changes on the cervix that might become cervical cancer if left untreated. A Pap test is a procedure in which cells are obtained and examined from the lower end of the uterus (cervix).  Women should have a Pap test starting at age 21.  Between ages 21 and 29, Pap tests should be repeated every 2 years.  Beginning at age 30, you should have a Pap test every 3 years as long as the past 3 Pap tests have been normal.  Some women have medical problems that increase the chance of getting cervical cancer. Talk to your caregiver about these problems. It is especially important to talk to your caregiver if a new problem develops soon after your last Pap test. In these cases, your caregiver may recommend more frequent screening and Pap tests.  The above recommendations are the same for women who have or have not gotten the vaccine for human papillomavirus (HPV).  If you had a hysterectomy for a problem that was not cancer or a condition that could lead to cancer, then you no longer need Pap tests. Even if you no longer need a Pap test, a regular exam is a good idea to make sure no other problems are  starting.  If you are between ages 65 and 70, and you have had normal Pap tests going back 10 years, you no longer need Pap tests. Even if you no longer need a Pap test, a regular exam is a good idea to make sure no other problems are starting.  If you have had past treatment for cervical cancer or a condition that could lead to cancer, you need Pap tests and screening for cancer for at least 20 years after your treatment.  If Pap tests have been discontinued, risk factors (such as a new sexual partner) need to be reassessed to determine if screening should be resumed.  The HPV test is an additional test that may be used for cervical cancer screening. The HPV test looks for the virus that can cause the cell changes on the cervix. The cells collected during the Pap test can be tested for HPV. The HPV test could be used to screen women aged 30 years and older, and should   be used in women of any age who have unclear Pap test results. After the age of 30, women should have HPV testing at the same frequency as a Pap test.  Colorectal cancer can be detected and often prevented. Most routine colorectal cancer screening begins at the age of 50 and continues through age 75. However, your caregiver may recommend screening at an earlier age if you have risk factors for colon cancer. On a yearly basis, your caregiver may provide home test kits to check for hidden blood in the stool. Use of a small camera at the end of a tube, to directly examine the colon (sigmoidoscopy or colonoscopy), can detect the earliest forms of colorectal cancer. Talk to your caregiver about this at age 50, when routine screening begins. Direct examination of the colon should be repeated every 5 to 10 years through age 75, unless early forms of pre-cancerous polyps or small growths are found.  Hepatitis C blood testing is recommended for all people born from 1945 through 1965 and any individual with known risks for hepatitis C.  Practice  safe sex. Use condoms and avoid high-risk sexual practices to reduce the spread of sexually transmitted infections (STIs). STIs include gonorrhea, chlamydia, syphilis, trichomonas, herpes, HPV, and human immunodeficiency virus (HIV). Herpes, HIV, and HPV are viral illnesses that have no cure. They can result in disability, cancer, and death. Sexually active women aged 25 and younger should be checked for chlamydia. Older women with new or multiple partners should also be tested for chlamydia. Testing for other STIs is recommended if you are sexually active and at increased risk.  Osteoporosis is a disease in which the bones lose minerals and strength with aging. This can result in serious bone fractures. The risk of osteoporosis can be identified using a bone density scan. Women ages 65 and over and women at risk for fractures or osteoporosis should discuss screening with their caregivers. Ask your caregiver whether you should take a calcium supplement or vitamin D to reduce the rate of osteoporosis.  Menopause can be associated with physical symptoms and risks. Hormone replacement therapy is available to decrease symptoms and risks. You should talk to your caregiver about whether hormone replacement therapy is right for you.  Use sunscreen with sun protection factor (SPF) of 30 or more. Apply sunscreen liberally and repeatedly throughout the day. You should seek shade when your shadow is shorter than you. Protect yourself by wearing long sleeves, pants, a wide-brimmed hat, and sunglasses year round, whenever you are outdoors.  Once a month, do a whole body skin exam, using a mirror to look at the skin on your back. Notify your caregiver of new moles, moles that have irregular borders, moles that are larger than a pencil eraser, or moles that have changed in shape or color.  Stay current with required immunizations.  Influenza. You need a dose every fall (or winter). The composition of the flu vaccine  changes each year, so being vaccinated once is not enough.  Pneumococcal polysaccharide. You need 1 to 2 doses if you smoke cigarettes or if you have certain chronic medical conditions. You need 1 dose at age 65 (or older) if you have never been vaccinated.  Tetanus, diphtheria, pertussis (Tdap, Td). Get 1 dose of Tdap vaccine if you are younger than age 65, are over 65 and have contact with an infant, are a healthcare worker, are pregnant, or simply want to be protected from whooping cough. After that, you need a Td   booster dose every 10 years. Consult your caregiver if you have not had at least 3 tetanus and diphtheria-containing shots sometime in your life or have a deep or dirty wound.  HPV. You need this vaccine if you are a woman age 26 or younger. The vaccine is given in 3 doses over 6 months.  Measles, mumps, rubella (MMR). You need at least 1 dose of MMR if you were born in 1957 or later. You may also need a second dose.  Meningococcal. If you are age 19 to 21 and a first-year college student living in a residence hall, or have one of several medical conditions, you need to get vaccinated against meningococcal disease. You may also need additional booster doses.  Zoster (shingles). If you are age 60 or older, you should get this vaccine.  Varicella (chickenpox). If you have never had chickenpox or you were vaccinated but received only 1 dose, talk to your caregiver to find out if you need this vaccine.  Hepatitis A. You need this vaccine if you have a specific risk factor for hepatitis A virus infection or you simply wish to be protected from this disease. The vaccine is usually given as 2 doses, 6 to 18 months apart.  Hepatitis B. You need this vaccine if you have a specific risk factor for hepatitis B virus infection or you simply wish to be protected from this disease. The vaccine is given in 3 doses, usually over 6 months. Preventive Services / Frequency Ages 19 to 39  Blood  pressure check.** / Every 1 to 2 years.  Lipid and cholesterol check.** / Every 5 years beginning at age 20.  Clinical breast exam.** / Every 3 years for women in their 20s and 30s.  Pap test.** / Every 2 years from ages 21 through 29. Every 3 years starting at age 30 through age 65 or 70 with a history of 3 consecutive normal Pap tests.  HPV screening.** / Every 3 years from ages 30 through ages 65 to 70 with a history of 3 consecutive normal Pap tests.  Hepatitis C blood test.** / For any individual with known risks for hepatitis C.  Skin self-exam. / Monthly.  Influenza immunization.** / Every year.  Pneumococcal polysaccharide immunization.** / 1 to 2 doses if you smoke cigarettes or if you have certain chronic medical conditions.  Tetanus, diphtheria, pertussis (Tdap, Td) immunization. / A one-time dose of Tdap vaccine. After that, you need a Td booster dose every 10 years.  HPV immunization. / 3 doses over 6 months, if you are 26 and younger.  Measles, mumps, rubella (MMR) immunization. / You need at least 1 dose of MMR if you were born in 1957 or later. You may also need a second dose.  Meningococcal immunization. / 1 dose if you are age 19 to 21 and a first-year college student living in a residence hall, or have one of several medical conditions, you need to get vaccinated against meningococcal disease. You may also need additional booster doses.  Varicella immunization.** / Consult your caregiver.  Hepatitis A immunization.** / Consult your caregiver. 2 doses, 6 to 18 months apart.  Hepatitis B immunization.** / Consult your caregiver. 3 doses usually over 6 months. Ages 40 to 64  Blood pressure check.** / Every 1 to 2 years.  Lipid and cholesterol check.** / Every 5 years beginning at age 20.  Clinical breast exam.** / Every year after age 40.  Mammogram.** / Every year beginning at age 40   and continuing for as long as you are in good health. Consult with your  caregiver.  Pap test.** / Every 3 years starting at age 30 through age 65 or 70 with a history of 3 consecutive normal Pap tests.  HPV screening.** / Every 3 years from ages 30 through ages 65 to 70 with a history of 3 consecutive normal Pap tests.  Fecal occult blood test (FOBT) of stool. / Every year beginning at age 50 and continuing until age 75. You may not need to do this test if you get a colonoscopy every 10 years.  Flexible sigmoidoscopy or colonoscopy.** / Every 5 years for a flexible sigmoidoscopy or every 10 years for a colonoscopy beginning at age 50 and continuing until age 75.  Hepatitis C blood test.** / For all people born from 1945 through 1965 and any individual with known risks for hepatitis C.  Skin self-exam. / Monthly.  Influenza immunization.** / Every year.  Pneumococcal polysaccharide immunization.** / 1 to 2 doses if you smoke cigarettes or if you have certain chronic medical conditions.  Tetanus, diphtheria, pertussis (Tdap, Td) immunization.** / A one-time dose of Tdap vaccine. After that, you need a Td booster dose every 10 years.  Measles, mumps, rubella (MMR) immunization. / You need at least 1 dose of MMR if you were born in 1957 or later. You may also need a second dose.  Varicella immunization.** / Consult your caregiver.  Meningococcal immunization.** / Consult your caregiver.  Hepatitis A immunization.** / Consult your caregiver. 2 doses, 6 to 18 months apart.  Hepatitis B immunization.** / Consult your caregiver. 3 doses, usually over 6 months. Ages 65 and over  Blood pressure check.** / Every 1 to 2 years.  Lipid and cholesterol check.** / Every 5 years beginning at age 20.  Clinical breast exam.** / Every year after age 40.  Mammogram.** / Every year beginning at age 40 and continuing for as long as you are in good health. Consult with your caregiver.  Pap test.** / Every 3 years starting at age 30 through age 65 or 70 with a 3  consecutive normal Pap tests. Testing can be stopped between 65 and 70 with 3 consecutive normal Pap tests and no abnormal Pap or HPV tests in the past 10 years.  HPV screening.** / Every 3 years from ages 30 through ages 65 or 70 with a history of 3 consecutive normal Pap tests. Testing can be stopped between 65 and 70 with 3 consecutive normal Pap tests and no abnormal Pap or HPV tests in the past 10 years.  Fecal occult blood test (FOBT) of stool. / Every year beginning at age 50 and continuing until age 75. You may not need to do this test if you get a colonoscopy every 10 years.  Flexible sigmoidoscopy or colonoscopy.** / Every 5 years for a flexible sigmoidoscopy or every 10 years for a colonoscopy beginning at age 50 and continuing until age 75.  Hepatitis C blood test.** / For all people born from 1945 through 1965 and any individual with known risks for hepatitis C.  Osteoporosis screening.** / A one-time screening for women ages 65 and over and women at risk for fractures or osteoporosis.  Skin self-exam. / Monthly.  Influenza immunization.** / Every year.  Pneumococcal polysaccharide immunization.** / 1 dose at age 65 (or older) if you have never been vaccinated.  Tetanus, diphtheria, pertussis (Tdap, Td) immunization. / A one-time dose of Tdap vaccine if you are over   65 and have contact with an infant, are a healthcare worker, or simply want to be protected from whooping cough. After that, you need a Td booster dose every 10 years.  Varicella immunization.** / Consult your caregiver.  Meningococcal immunization.** / Consult your caregiver.  Hepatitis A immunization.** / Consult your caregiver. 2 doses, 6 to 18 months apart.  Hepatitis B immunization.** / Check with your caregiver. 3 doses, usually over 6 months. ** Family history and personal history of risk and conditions may change your caregiver's recommendations. Document Released: 11/26/2001 Document Revised: 12/23/2011  Document Reviewed: 02/25/2011 [employer] Patient Information 2013 [employer].  

## 2012-10-19 NOTE — Assessment & Plan Note (Signed)
Significant discharge noted on exam, sent for yeast and BV testing. Add a probiotic

## 2012-10-19 NOTE — Assessment & Plan Note (Addendum)
Pap today, significant yellow to brown discharge but no other concerns on exam

## 2012-10-20 ENCOUNTER — Encounter: Payer: Self-pay | Admitting: Family Medicine

## 2012-10-20 DIAGNOSIS — E785 Hyperlipidemia, unspecified: Secondary | ICD-10-CM

## 2012-10-20 DIAGNOSIS — N946 Dysmenorrhea, unspecified: Secondary | ICD-10-CM | POA: Insufficient documentation

## 2012-10-20 HISTORY — DX: Hyperlipidemia, unspecified: E78.5

## 2012-10-20 LAB — FOLLICLE STIMULATING HORMONE: FSH: 6 m[IU]/mL

## 2012-10-20 LAB — PROLACTIN: Prolactin: 24.1 ng/mL

## 2012-10-20 NOTE — Assessment & Plan Note (Addendum)
Now struggling with 8 day day heavy, painful cycles since spring of 2013. Consider PCOS with acne and mom with h/o same. Will check some labs consider Metformin vs OCP. Patient hesitant to consider OCP

## 2012-10-20 NOTE — Assessment & Plan Note (Signed)
Repeat cbc today 

## 2012-10-20 NOTE — Progress Notes (Signed)
Patient ID: Bonnie Lewis, female   DOB: 1985/12/11, 27 y.o.   MRN: [id number] Bonnie Lewis [id number] [birth year] 10/20/2012      Progress Note-Follow Up  Subjective  Chief Complaint  Chief Complaint  Patient presents with  . Annual Exam    physical  . Gynecologic Exam    pap    HPI  Patient is a 27 year old Caucasian female who is in today for annual exam. She has had increasing difficulty with painful and heavy periods and is here today to have her first Pap smear done. She's having periods as long as a 8 days heavy and painful. She's also had some intermittent vaginal discharge in between. She's not sexually active and has no plans to be so. She denies any abdominal or back pain any lesions or further concerns. Contents continues to help keep her focused but she is struggling with a lower mood and believes she struggles with seasonal affective disorder. Bedrooms down the basement and during the winter she notes her mood is decreasing. She denies any suicidal ideation or other further concerns. No recent physical illness, fevers, headaches, chest pain, palpitations, shortness of breath, GI or GU complaints. Continues to struggle with mild back pain but notes it is somewhat better at this time.  Past Medical History  Diagnosis Date  . ADD (attention deficit disorder)   . Bronchitis 08/11/2011  . LLQ pain 08/11/2011  . Anemia 09/12/2011  . Insomnia 09/12/2011  . Preventative health care 09/16/2011  . Seasonal affective disorder 10/19/2012  . Cervical cancer 10/19/2012  . Cervical cancer screening 10/19/2012  . Vaginitis 10/19/2012  . Dysmenorrhea 10/20/2012  . Dyslipidemia 10/20/2012    History reviewed. No pertinent past surgical history.  Family History  Problem Relation Age of Onset  . Asthma Father   . Depression Father     and anxiety  . Allergies Father   . Irritable bowel syndrome Father   . Dementia Father   . Depression Sister   . Irritable bowel syndrome Sister   . Migraines  Sister   . Depression Brother   . Allergies Brother   . Depression Maternal Grandmother     Anxiety  . Hypertension Maternal Grandmother   . ADD / ADHD Maternal Grandmother   . Menstrual problems Maternal Grandmother     heavy bleeding requiring hysterectomy  . Depression Paternal Grandmother   . Cardiomyopathy Paternal Grandmother   . Menstrual problems Paternal Grandmother   . Hyperlipidemia Mother   . Anemia Sister   . Coronary artery disease Paternal Grandfather   . Cancer Paternal Aunt     History   Social History  . Marital Status: Single    Spouse Name: N/A    Number of Children: N/A  . Years of Education: N/A   Occupational History  . Not on file.   Social History Main Topics  . Smoking status: Never Smoker   . Smokeless tobacco: Never Used  . Alcohol Use: No  . Drug Use: No  . Sexually Active: No   Other Topics Concern  . Not on file   Social History Narrative  . No narrative on file    Current Outpatient Prescriptions on File Prior to Visit  Medication Sig Dispense Refill  . lisdexamfetamine (VYVANSE) 30 MG capsule Take 1 capsule (30 mg total) by mouth every morning. In pm March 2014 rx  30 capsule  0  . B Complex-C-Min-Fe-FA (HEMOCYTE PLUS) 106-1 MG CAPS Take 1 capsule by mouth daily.  30 each  3  . Calcium Carbonate (CALCIUM 500 PO) Take by mouth daily.        . fish oil-omega-3 fatty acids 1000 MG capsule Take 1,000 mg by mouth daily.          Allergies  Allergen Reactions  . Sulfonamide Derivatives     Review of Systems  Review of Systems  Constitutional: Negative for fever and malaise/fatigue.  HENT: Negative for congestion.   Eyes: Negative for discharge.  Respiratory: Negative for shortness of breath.   Cardiovascular: Negative for chest pain, palpitations and leg swelling.  Gastrointestinal: Negative for nausea, abdominal pain and diarrhea.  Genitourinary: Negative for dysuria.  Musculoskeletal: Positive for back pain. Negative for  falls.  Skin: Negative for rash.  Neurological: Negative for loss of consciousness and headaches.  Endo/Heme/Allergies: Negative for polydipsia.  Psychiatric/Behavioral: Positive for depression. Negative for suicidal ideas. The patient is not nervous/anxious and does not have insomnia.     Objective  BP 120/76  Pulse 78  Temp 98 F (36.7 C) (Temporal)  Ht 5' 4.5" (1.638 m)  Wt 151 lb 12.8 oz (68.856 kg)  BMI 25.65 kg/m2  SpO2 98%  LMP 10/11/2012  Physical Exam  Physical Exam  Constitutional: She is oriented to person, place, and time and well-developed, well-nourished, and in no distress. No distress.  HENT:  Head: Normocephalic and atraumatic.  Right Ear: External ear normal.  Left Ear: External ear normal.  Nose: Nose normal.  Mouth/Throat: Oropharynx is clear and moist. No oropharyngeal exudate.  Eyes: Conjunctivae normal are normal. Pupils are equal, round, and reactive to light. Right eye exhibits no discharge. Left eye exhibits no discharge. No scleral icterus.  Neck: Normal range of motion. Neck supple. No thyromegaly present.  Cardiovascular: Normal rate, regular rhythm, normal heart sounds and intact distal pulses.   No murmur heard. Pulmonary/Chest: Effort normal and breath sounds normal. No respiratory distress. She has no wheezes. She has no rales.  Abdominal: Soft. Bowel sounds are normal. She exhibits no distension and no mass. There is no tenderness.  Genitourinary: Uterus normal, cervix normal, right adnexa normal and left adnexa normal. Vaginal discharge found.       Yellow to brown, creamy discharge  Musculoskeletal: Normal range of motion. She exhibits no edema and no tenderness.  Lymphadenopathy:    She has no cervical adenopathy.  Neurological: She is alert and oriented to person, place, and time. She has normal reflexes. No cranial nerve deficit. Coordination normal.  Skin: Skin is warm and dry. No rash noted. She is not diaphoretic.  Psychiatric: Mood,  memory and affect normal.    Lab Results  Component Value Date   TSH 2.17 09/12/2011   Lab Results  Component Value Date   WBC 6.2 09/12/2011   HGB 12.9 09/12/2011   HCT 37.5 09/12/2011   MCV 86.5 09/12/2011   PLT 272.0 09/12/2011   Lab Results  Component Value Date   CREATININE 0.8 09/12/2011   BUN 15 09/12/2011   NA 139 09/12/2011   K 3.9 09/12/2011   CL 107 09/12/2011   CO2 27 09/12/2011   Lab Results  Component Value Date   ALT 15 09/12/2011   AST 20 09/12/2011   ALKPHOS 42 09/12/2011   BILITOT 0.8 09/12/2011   Lab Results  Component Value Date   CHOL 169 09/12/2011   Lab Results  Component Value Date   HDL 77.30 09/12/2011   Lab Results  Component Value Date   LDLCALC 82 09/12/2011   Lab  Results  Component Value Date   TRIG 47.0 09/12/2011   Lab Results  Component Value Date   CHOLHDL 2 09/12/2011     Assessment & Plan  Seasonal affective disorder Patient is requesting a prescription for lights to helps she is struggling with low mood and lives in a basement with low light. Her mother struggles with SAD and has done well with the lights. Will write a prescription  Cervical cancer screening Pap today, significant yellow to brown discharge but no other concerns on exam  Vaginitis Significant discharge noted on exam, sent for yeast and BV testing. Add a probiotic  Dysmenorrhea Now struggling with 8 day day heavy, painful cycles since spring of 2013. Consider PCOS with acne and mom with h/o same. Will check some labs consider Metformin vs OCP. Patient hesitant to consider OCP  Anemia Repeat cbc today  Dyslipidemia Repeat lipid panel, mild in the past, avoid trans fats

## 2012-10-20 NOTE — Assessment & Plan Note (Signed)
Repeat lipid panel, mild in the past, avoid trans fats

## 2012-10-20 NOTE — Addendum Note (Signed)
Addended by: Baldemar Lenis R on: 10/20/2012 04:07 PM   Modules accepted: Orders

## 2012-10-23 MED ORDER — METRONIDAZOLE 500 MG PO TABS: 500.0000 mg | ORAL_TABLET | Freq: Three times a day (TID) | ORAL | Status: DC

## 2012-10-23 NOTE — Progress Notes (Signed)
Quick Note:  Patients mother informed and voiced understanding. RX sent ______

## 2012-10-23 NOTE — Addendum Note (Signed)
Addended by: Court Joy on: 10/23/2012 04:24 PM   Modules accepted: Orders

## 2012-11-18 ENCOUNTER — Other Ambulatory Visit: Payer: Self-pay | Admitting: Family Medicine

## 2013-01-15 ENCOUNTER — Telehealth: Payer: Self-pay | Admitting: Family Medicine

## 2013-01-15 ENCOUNTER — Other Ambulatory Visit: Payer: Self-pay | Admitting: Family Medicine

## 2013-01-15 MED ORDER — LISDEXAMFETAMINE DIMESYLATE 40 MG PO CAPS: 40.0000 mg | ORAL_CAPSULE | ORAL | Status: DC

## 2013-01-15 MED ORDER — LISDEXAMFETAMINE DIMESYLATE 30 MG PO CAPS: 30.0000 mg | ORAL_CAPSULE | ORAL | Status: DC

## 2013-01-15 NOTE — Telephone Encounter (Signed)
RX's printed and put at front desk at OR. Left a detailed message on pts mothers vm

## 2013-01-15 NOTE — Telephone Encounter (Signed)
Please advise Vyvanse 30 mg and 40 mg refill? Last RX was wrote on 10-19-12 for Jan, Feb, and March.

## 2013-01-15 NOTE — Telephone Encounter (Signed)
OK to rf April, May June Vyvanse both strengths

## 2013-02-18 ENCOUNTER — Other Ambulatory Visit: Payer: Self-pay | Admitting: Family Medicine

## 2013-04-12 ENCOUNTER — Ambulatory Visit (HOSPITAL_BASED_OUTPATIENT_CLINIC_OR_DEPARTMENT_OTHER)
Admission: RE | Admit: 2013-04-12 | Discharge: 2013-04-12 | Disposition: A | Discharge status: Home / Self Care | Payer: Managed Care, Other (non HMO) | Source: Ambulatory Visit | Attending: Family Medicine | Admitting: Family Medicine

## 2013-04-12 ENCOUNTER — Encounter: Payer: Self-pay | Admitting: Family Medicine

## 2013-04-12 ENCOUNTER — Ambulatory Visit (INDEPENDENT_AMBULATORY_CARE_PROVIDER_SITE_OTHER): Payer: Managed Care, Other (non HMO) | Admitting: Family Medicine

## 2013-04-12 VITALS — BP 100/80 | HR 96 | Temp 98.2°F | Ht 64.5 in | Wt 159.0 lb

## 2013-04-12 DIAGNOSIS — S9030XA Contusion of unspecified foot, initial encounter: Secondary | ICD-10-CM | POA: Insufficient documentation

## 2013-04-12 DIAGNOSIS — IMO0002 Reserved for concepts with insufficient information to code with codable children: Secondary | ICD-10-CM | POA: Insufficient documentation

## 2013-04-12 DIAGNOSIS — M4120 Other idiopathic scoliosis, site unspecified: Secondary | ICD-10-CM | POA: Insufficient documentation

## 2013-04-12 DIAGNOSIS — L708 Other acne: Secondary | ICD-10-CM

## 2013-04-12 DIAGNOSIS — M549 Dorsalgia, unspecified: Secondary | ICD-10-CM

## 2013-04-12 DIAGNOSIS — F988 Other specified behavioral and emotional disorders with onset usually occurring in childhood and adolescence: Secondary | ICD-10-CM

## 2013-04-12 DIAGNOSIS — R41840 Attention and concentration deficit: Secondary | ICD-10-CM

## 2013-04-12 DIAGNOSIS — M7989 Other specified soft tissue disorders: Secondary | ICD-10-CM | POA: Insufficient documentation

## 2013-04-12 DIAGNOSIS — M79609 Pain in unspecified limb: Secondary | ICD-10-CM | POA: Insufficient documentation

## 2013-04-12 DIAGNOSIS — L709 Acne, unspecified: Secondary | ICD-10-CM

## 2013-04-12 DIAGNOSIS — M25579 Pain in unspecified ankle and joints of unspecified foot: Secondary | ICD-10-CM

## 2013-04-12 DIAGNOSIS — M25571 Pain in right ankle and joints of right foot: Secondary | ICD-10-CM

## 2013-04-12 DIAGNOSIS — D649 Anemia, unspecified: Secondary | ICD-10-CM

## 2013-04-12 DIAGNOSIS — E782 Mixed hyperlipidemia: Secondary | ICD-10-CM

## 2013-04-12 MED ORDER — AZELAIC ACID 20 % EX CREA: TOPICAL_CREAM | Freq: Two times a day (BID) | CUTANEOUS | Status: DC

## 2013-04-12 MED ORDER — LISDEXAMFETAMINE DIMESYLATE 40 MG PO CAPS: 40.0000 mg | ORAL_CAPSULE | ORAL | Status: DC

## 2013-04-12 MED ORDER — LISDEXAMFETAMINE DIMESYLATE 30 MG PO CAPS: 30.0000 mg | ORAL_CAPSULE | ORAL | Status: DC

## 2013-04-12 MED ORDER — SULFACETAMIDE SODIUM-SULFUR 10-5 % EX EMUL: CUTANEOUS | Status: DC

## 2013-04-12 MED ORDER — RIZATRIPTAN BENZOATE 10 MG PO TABS: 10.0000 mg | ORAL_TABLET | ORAL | Status: DC | PRN

## 2013-04-13 ENCOUNTER — Encounter: Payer: Self-pay | Admitting: Family Medicine

## 2013-04-13 NOTE — Assessment & Plan Note (Signed)
Resolved with most recent blood draw

## 2013-04-13 NOTE — Assessment & Plan Note (Signed)
Intermittent and tolerable, encouraged exercise and stretching. Patient xrays normal

## 2013-04-13 NOTE — Progress Notes (Signed)
Patient ID: Bonnie Lewis, female   DOB: 11-26-1985, 26 y.o.   MRN: [id number] Melinna Linarez [id number] [birth year] 04/13/2013      Progress Note-Follow Up  Subjective  Chief Complaint  Chief Complaint  Patient presents with  . Follow-up    6 month    HPI  Patient 27 year old Caucasian female who is in today for followup. Overall she's doing well. She's not had any recent illness. She denies any fevers, headaches, chest pain, palpitations, shortness of breath, GI or GU complaints. Continues to have intermittent but mild trouble with back pain most notably in the thorax take and lumbar spine. Notes it is worseheavy lifting or prolonged sitting. No radicular symptoms or incontinence is noted. Acne is well controlled when she uses her topical treatments regularly. Finances well tolerated and she denies any concerning side effects.  Past Medical History  Diagnosis Date  . ADD (attention deficit disorder)   . Bronchitis 08/11/2011  . LLQ pain 08/11/2011  . Anemia 09/12/2011  . Insomnia 09/12/2011  . Preventative health care 09/16/2011  . Seasonal affective disorder 10/19/2012  . Cervical cancer 10/19/2012  . Cervical cancer screening 10/19/2012  . Vaginitis 10/19/2012  . Dysmenorrhea 10/20/2012  . Dyslipidemia 10/20/2012    History reviewed. No pertinent past surgical history.  Family History  Problem Relation Age of Onset  . Asthma Father   . Depression Father     and anxiety  . Allergies Father   . Irritable bowel syndrome Father   . Dementia Father   . Depression Sister   . Irritable bowel syndrome Sister   . Migraines Sister   . Depression Brother   . Allergies Brother   . Depression Maternal Grandmother     Anxiety  . Hypertension Maternal Grandmother   . ADD / ADHD Maternal Grandmother   . Menstrual problems Maternal Grandmother     heavy bleeding requiring hysterectomy  . Depression Paternal Grandmother   . Cardiomyopathy Paternal Grandmother   . Menstrual problems Paternal  Grandmother   . Hyperlipidemia Mother   . Anemia Sister   . Coronary artery disease Paternal Grandfather   . Cancer Paternal Aunt     History   Social History  . Marital Status: Single    Spouse Name: N/A    Number of Children: N/A  . Years of Education: N/A   Occupational History  . Not on file.   Social History Main Topics  . Smoking status: Never Smoker   . Smokeless tobacco: Never Used  . Alcohol Use: No  . Drug Use: No  . Sexually Active: No   Other Topics Concern  . Not on file   Social History Narrative  . No narrative on file    Current Outpatient Prescriptions on File Prior to Visit  Medication Sig Dispense Refill  . fish oil-omega-3 fatty acids 1000 MG capsule Take 1,000 mg by mouth daily.         No current facility-administered medications on file prior to visit.    Allergies  Allergen Reactions  . Sulfonamide Derivatives     Review of Systems  Review of Systems  Constitutional: Negative for fever and malaise/fatigue.  HENT: Negative for congestion.   Eyes: Negative for pain and discharge.  Respiratory: Negative for shortness of breath.   Cardiovascular: Negative for chest pain, palpitations and leg swelling.  Gastrointestinal: Negative for nausea, abdominal pain and diarrhea.  Genitourinary: Negative for dysuria.  Musculoskeletal: Negative for falls.  Skin: Negative for rash.  Neurological: Negative  for loss of consciousness and headaches.  Endo/Heme/Allergies: Negative for polydipsia.  Psychiatric/Behavioral: Negative for depression and suicidal ideas. The patient is not nervous/anxious and does not have insomnia.     Objective  BP 100/80  Pulse 96  Temp(Src) 98.2 F (36.8 C)  Ht 5' 4.5" (1.638 m)  Wt 159 lb 0.6 oz (72.14 kg)  BMI 26.89 kg/m2  SpO2 96%  LMP 03/24/2013  Physical Exam  Physical Exam  Constitutional: She is oriented to person, place, and time and well-developed, well-nourished, and in no distress. No distress.   HENT:  Head: Normocephalic and atraumatic.  Eyes: Conjunctivae are normal.  Neck: Neck supple. No thyromegaly present.  Cardiovascular: Normal rate, regular rhythm and normal heart sounds.   No murmur heard. Pulmonary/Chest: Effort normal and breath sounds normal. She has no wheezes.  Abdominal: She exhibits no distension and no mass.  Musculoskeletal: She exhibits no edema.  Lymphadenopathy:    She has no cervical adenopathy.  Neurological: She is alert and oriented to person, place, and time.  Skin: Skin is warm and dry. No rash noted. She is not diaphoretic.  Psychiatric: Memory, affect and judgment normal.    Lab Results  Component Value Date   TSH 1.42 10/19/2012   Lab Results  Component Value Date   WBC 6.2 09/12/2011   HGB 12.9 09/12/2011   HCT 37.5 09/12/2011   MCV 86.5 09/12/2011   PLT 272.0 09/12/2011   Lab Results  Component Value Date   CREATININE 0.9 10/19/2012   BUN 17 10/19/2012   NA 138 10/19/2012   K 4.1 10/19/2012   CL 104 10/19/2012   CO2 27 10/19/2012   Lab Results  Component Value Date   ALT 25 10/19/2012   AST 25 10/19/2012   ALKPHOS 39 10/19/2012   BILITOT 0.9 10/19/2012   Lab Results  Component Value Date   CHOL 207* 10/19/2012   Lab Results  Component Value Date   HDL 91.40 10/19/2012   Lab Results  Component Value Date   LDLCALC 82 09/12/2011   Lab Results  Component Value Date   TRIG 57.0 10/19/2012   Lab Results  Component Value Date   CHOLHDL 2 10/19/2012     Assessment & Plan  BACK PAIN Intermittent and tolerable, encouraged exercise and stretching. Patient xrays normal  ACNE VULGARIS Good response to topical treatments. Refills given today  Mixed hyperlipidemia Mild avoid trans fats, increase exercise, monitor  ATTENTION OR CONCENTRATION DEFICIT Given refills on Vyvanse, tolerating well  Anemia Resolved with most recent blood draw

## 2013-04-13 NOTE — Assessment & Plan Note (Signed)
Good response to topical treatments. Refills given today

## 2013-04-13 NOTE — Assessment & Plan Note (Signed)
Given refills on Vyvanse, tolerating well

## 2013-04-13 NOTE — Assessment & Plan Note (Signed)
Mild avoid trans fats, increase exercise, monitor

## 2013-05-03 ENCOUNTER — Telehealth: Payer: Self-pay | Admitting: Family Medicine

## 2013-05-03 DIAGNOSIS — R5383 Other fatigue: Secondary | ICD-10-CM

## 2013-05-03 DIAGNOSIS — M549 Dorsalgia, unspecified: Secondary | ICD-10-CM

## 2013-05-03 DIAGNOSIS — R5381 Other malaise: Secondary | ICD-10-CM

## 2013-05-03 DIAGNOSIS — E782 Mixed hyperlipidemia: Secondary | ICD-10-CM

## 2013-05-03 NOTE — Telephone Encounter (Signed)
Patient states that she was told to do labs one week prior to 05/14/13 appointment. I did not see where she needed labs. Could you look? I did schedule her a lab appointment for [facility]. She would like a callback as to if she does need labwork. Thanks.

## 2013-05-03 NOTE — Telephone Encounter (Signed)
Needs lipid, renal, cbc, tsh, hepatic for hyperlipidemia, fatigue, back pain

## 2013-05-03 NOTE — Telephone Encounter (Signed)
Please advise if labs are to be done?

## 2013-05-04 ENCOUNTER — Ambulatory Visit: Payer: Self-pay | Admitting: Family Medicine

## 2013-05-04 NOTE — Telephone Encounter (Signed)
I placed pts lab orders and tried to contact her but it states that pt is not taking calls at this time.

## 2013-05-05 ENCOUNTER — Other Ambulatory Visit (INDEPENDENT_AMBULATORY_CARE_PROVIDER_SITE_OTHER): Payer: Managed Care, Other (non HMO)

## 2013-05-05 DIAGNOSIS — M549 Dorsalgia, unspecified: Secondary | ICD-10-CM

## 2013-05-05 DIAGNOSIS — E785 Hyperlipidemia, unspecified: Secondary | ICD-10-CM

## 2013-05-05 DIAGNOSIS — R5383 Other fatigue: Secondary | ICD-10-CM

## 2013-05-05 DIAGNOSIS — E782 Mixed hyperlipidemia: Secondary | ICD-10-CM

## 2013-05-05 DIAGNOSIS — R5381 Other malaise: Secondary | ICD-10-CM

## 2013-05-05 LAB — HEPATIC FUNCTION PANEL
AST: 21 U/L (ref 0–37)
Albumin: 3.9 g/dL (ref 3.5–5.2)
Alkaline Phosphatase: 45 U/L (ref 39–117)
Bilirubin, Direct: 0.1 mg/dL (ref 0.0–0.3)
Total Protein: 6.3 g/dL (ref 6.0–8.3)

## 2013-05-05 LAB — RENAL FUNCTION PANEL
CO2: 26 mEq/L (ref 19–32)
Calcium: 9.1 mg/dL (ref 8.4–10.5)
Chloride: 104 mEq/L (ref 96–112)
Creatinine, Ser: 0.8 mg/dL (ref 0.4–1.2)
GFR: 92.2 mL/min (ref >60.00)
Potassium: 3.8 mEq/L (ref 3.5–5.1)
Sodium: 137 mEq/L (ref 135–145)

## 2013-05-05 LAB — CBC
HCT: 33.9 % — ABNORMAL LOW (ref 36.0–46.0)
Hemoglobin: 11.5 g/dL — ABNORMAL LOW (ref 12.0–15.0)
Platelets: 288 10*3/uL (ref 150.0–400.0)
RBC: 3.87 Mil/uL (ref 3.87–5.11)
RDW: 13.7 % (ref 11.5–14.6)
WBC: 7.7 10*3/uL (ref 4.5–10.5)

## 2013-05-05 LAB — LIPID PANEL
HDL: 73.9 mg/dL (ref >39.00)
Total CHOL/HDL Ratio: 2

## 2013-05-05 NOTE — Progress Notes (Signed)
Labs only

## 2013-05-14 ENCOUNTER — Ambulatory Visit (INDEPENDENT_AMBULATORY_CARE_PROVIDER_SITE_OTHER): Payer: Managed Care, Other (non HMO) | Admitting: Family Medicine

## 2013-05-14 ENCOUNTER — Encounter: Payer: Self-pay | Admitting: Family Medicine

## 2013-05-14 VITALS — BP 122/72 | HR 83 | Temp 97.2°F | Wt 160.8 lb

## 2013-05-14 DIAGNOSIS — G43909 Migraine, unspecified, not intractable, without status migrainosus: Secondary | ICD-10-CM

## 2013-05-14 DIAGNOSIS — R41840 Attention and concentration deficit: Secondary | ICD-10-CM

## 2013-05-14 DIAGNOSIS — H60393 Other infective otitis externa, bilateral: Secondary | ICD-10-CM

## 2013-05-14 DIAGNOSIS — E782 Mixed hyperlipidemia: Secondary | ICD-10-CM

## 2013-05-14 DIAGNOSIS — F988 Other specified behavioral and emotional disorders with onset usually occurring in childhood and adolescence: Secondary | ICD-10-CM

## 2013-05-14 DIAGNOSIS — H60399 Other infective otitis externa, unspecified ear: Secondary | ICD-10-CM

## 2013-05-14 DIAGNOSIS — M549 Dorsalgia, unspecified: Secondary | ICD-10-CM

## 2013-05-14 DIAGNOSIS — D649 Anemia, unspecified: Secondary | ICD-10-CM

## 2013-05-14 HISTORY — DX: Migraine, unspecified, not intractable, without status migrainosus: G43.909

## 2013-05-14 MED ORDER — CEFDINIR 300 MG PO CAPS: 300.0000 mg | ORAL_CAPSULE | Freq: Two times a day (BID) | ORAL | Status: AC

## 2013-05-14 MED ORDER — LISDEXAMFETAMINE DIMESYLATE 40 MG PO CAPS: 40.0000 mg | ORAL_CAPSULE | ORAL | Status: DC

## 2013-05-14 MED ORDER — LISDEXAMFETAMINE DIMESYLATE 30 MG PO CAPS: 30.0000 mg | ORAL_CAPSULE | ORAL | Status: DC

## 2013-05-14 MED ORDER — NEOMYCIN-POLYMYXIN-HC 3.5-10000-1 OT SOLN: 3.0000 [drp] | Freq: Three times a day (TID) | OTIC | Status: DC

## 2013-05-14 NOTE — Assessment & Plan Note (Signed)
Has been stable on this dose of Vyvanse for a long time, may have trouble affording meds once her insurance runs out so we may have to change. Is given refills through October today

## 2013-05-14 NOTE — Progress Notes (Signed)
Patient ID: Bonnie Lewis, female   DOB: 1985/11/01, 27 y.o.   MRN: [id number] Bonnie Lewis [id number] 01-28-1986 05/14/2013      Progress Note-Follow Up  Subjective  Chief Complaint  Chief Complaint  Patient presents with  . Follow-up    1 month    HPI  Patient is a 27 year old Caucasian female who is in today for routine followup. Overall she's too well. Continues to struggle with some intermittent back pain but finds it tolerable. Continues to have some slight decrease in sensation on her right foot wear she took a blunt trauma a month or 2 ago not worsening and no significant pain. No other acute illness. She did have a migraine a couple months ago but has not had one since being prescribed and Maxalt. She denies any major recent illness although she's had some mild nasal congestion. No chest pain, palpitations, fevers, GI or GU concerns are noted today.  Past Medical History  Diagnosis Date  . ADD (attention deficit disorder)   . Bronchitis 08/11/2011  . LLQ pain 08/11/2011  . Anemia 09/12/2011  . Insomnia 09/12/2011  . Preventative health care 09/16/2011  . Seasonal affective disorder 10/19/2012  . Cervical cancer 10/19/2012  . Cervical cancer screening 10/19/2012  . Vaginitis 10/19/2012  . Dysmenorrhea 10/20/2012  . Dyslipidemia 10/20/2012  . Migraine 05/14/2013    History reviewed. No pertinent past surgical history.  Family History  Problem Relation Age of Onset  . Asthma Father   . Depression Father     and anxiety  . Allergies Father   . Irritable bowel syndrome Father   . Dementia Father   . Depression Sister   . Irritable bowel syndrome Sister   . Migraines Sister   . Depression Brother   . Allergies Brother   . Depression Maternal Grandmother     Anxiety  . Hypertension Maternal Grandmother   . ADD / ADHD Maternal Grandmother   . Menstrual problems Maternal Grandmother     heavy bleeding requiring hysterectomy  . Depression Paternal Grandmother   . Cardiomyopathy  Paternal Grandmother   . Menstrual problems Paternal Grandmother   . Hyperlipidemia Mother   . Anemia Sister   . Coronary artery disease Paternal Grandfather   . Cancer Paternal Aunt     History   Social History  . Marital Status: Single    Spouse Name: N/A    Number of Children: N/A  . Years of Education: N/A   Occupational History  . Not on file.   Social History Main Topics  . Smoking status: Never Smoker   . Smokeless tobacco: Never Used  . Alcohol Use: No  . Drug Use: No  . Sexually Active: No   Other Topics Concern  . Not on file   Social History Narrative  . No narrative on file    Current Outpatient Prescriptions on File Prior to Visit  Medication Sig Dispense Refill  . azelaic acid (AZELEX) 20 % cream Apply topically 2 (two) times daily. After skin is thoroughly washed and patted dry, gently but thoroughly massage a thin film of azelaic acid cream into the affected area twice daily, in the morning and evening.  50 g  2  . fish oil-omega-3 fatty acids 1000 MG capsule Take 1,000 mg by mouth daily.        . Multiple Vitamin (MULTIVITAMIN) tablet Take 1 tablet by mouth daily. Rainbow lite      . rizatriptan (MAXALT) 10 MG tablet Take 1 tablet (10  mg total) by mouth as needed for migraine. May repeat in 2 hours if needed  10 tablet  0  . Sulfacetamide Sodium-Sulfur (AVAR CLEANSER) 10-5 % EMUL Apply to skin twice daily as directed  227 g  1   No current facility-administered medications on file prior to visit.    Allergies  Allergen Reactions  . Sulfonamide Derivatives     Review of Systems  Review of Systems  Constitutional: Negative for fever and malaise/fatigue.  HENT: Negative for congestion.   Eyes: Negative for pain and discharge.  Respiratory: Negative for shortness of breath.   Cardiovascular: Negative for chest pain, palpitations and leg swelling.  Gastrointestinal: Negative for nausea, abdominal pain and diarrhea.  Genitourinary: Negative for  dysuria.  Musculoskeletal: Positive for back pain. Negative for falls.  Skin: Negative for rash.  Neurological: Negative for loss of consciousness and headaches.  Endo/Heme/Allergies: Negative for polydipsia.  Psychiatric/Behavioral: Negative for depression and suicidal ideas. The patient is not nervous/anxious and does not have insomnia.     Objective  BP 122/72  Pulse 83  Temp(Src) 97.2 F (36.2 C) (Oral)  Wt 160 lb 12.8 oz (72.938 kg)  BMI 27.18 kg/m2  SpO2 98%  Physical Exam  Physical Exam  Constitutional: She is oriented to person, place, and time and well-developed, well-nourished, and in no distress. No distress.  HENT:  Head: Normocephalic and atraumatic.  Eyes: Conjunctivae are normal.  Neck: Neck supple. No thyromegaly present.  Cardiovascular: Normal rate, regular rhythm and normal heart sounds.  Exam reveals no gallop.   No murmur heard. Pulmonary/Chest: Effort normal and breath sounds normal. She has no wheezes.  Abdominal: She exhibits no distension and no mass.  Musculoskeletal: She exhibits no edema.  Lymphadenopathy:    She has no cervical adenopathy.  Neurological: She is alert and oriented to person, place, and time.  Skin: Skin is warm and dry. No rash noted. She is not diaphoretic.  Psychiatric: Memory, affect and judgment normal.    Lab Results  Component Value Date   TSH 1.79 05/05/2013   Lab Results  Component Value Date   WBC 7.7 05/05/2013   HGB 11.5* 05/05/2013   HCT 33.9* 05/05/2013   MCV 87.7 05/05/2013   PLT 288.0 05/05/2013   Lab Results  Component Value Date   CREATININE 0.8 05/05/2013   BUN 16 05/05/2013   NA 137 05/05/2013   K 3.8 05/05/2013   CL 104 05/05/2013   CO2 26 05/05/2013   Lab Results  Component Value Date   ALT 19 05/05/2013   AST 21 05/05/2013   ALKPHOS 45 05/05/2013   BILITOT 0.5 05/05/2013   Lab Results  Component Value Date   CHOL 177 05/05/2013   Lab Results  Component Value Date   HDL 73.90 05/05/2013   Lab  Results  Component Value Date   LDLCALC 96 05/05/2013   Lab Results  Component Value Date   TRIG 38.0 05/05/2013   Lab Results  Component Value Date   CHOLHDL 2 05/05/2013     Assessment & Plan  ATTENTION OR CONCENTRATION DEFICIT Has been stable on this dose of Vyvanse for a long time, may have trouble affording meds once her insurance runs out so we may have to change. Is given refills through October today  BACK PAIN Tolerable. Encouraged good posture, regular stretching and report any worsening pain  Mixed hyperlipidemia Resolved with dietary changes.   Anemia Mild, encouraged increased leafy greens, daily MVI and try cooking in cast  iron  Migraine No bad episodes since Maxalt prescribed, has this to try as needed, encouraged good rest, exercise, hydration

## 2013-05-14 NOTE — Assessment & Plan Note (Signed)
Resolved with dietary changes.  

## 2013-05-14 NOTE — Patient Instructions (Addendum)
Otitis Externa Otitis externa is a bacterial or fungal infection of the outer ear canal. This is the area from the eardrum to the outside of the ear. Otitis externa is sometimes called "swimmer's ear." CAUSES  Possible causes of infection include:  Swimming in dirty water.  Moisture remaining in the ear after swimming or bathing.  Mild injury (trauma) to the ear.  Objects stuck in the ear (foreign body).  Cuts or scrapes (abrasions) on the outside of the ear. SYMPTOMS  The first symptom of infection is often itching in the ear canal. Later signs and symptoms may include swelling and redness of the ear canal, ear pain, and yellowish-white fluid (pus) coming from the ear. The ear pain may be worse when pulling on the earlobe. DIAGNOSIS  Your caregiver will perform a physical exam. A sample of fluid may be taken from the ear and examined for bacteria or fungi. TREATMENT  Antibiotic ear drops are often given for 10 to 14 days. Treatment may also include pain medicine or corticosteroids to reduce itching and swelling. PREVENTION   Keep your ear dry. Use the corner of a towel to absorb water out of the ear canal after swimming or bathing.  Avoid scratching or putting objects inside your ear. This can damage the ear canal or remove the protective wax that lines the canal. This makes it easier for bacteria and fungi to grow.  Avoid swimming in lakes, polluted water, or poorly chlorinated pools.  You may use ear drops made of rubbing alcohol and vinegar after swimming. Combine equal parts of white vinegar and alcohol in a bottle. Put 3 or 4 drops into each ear after swimming. HOME CARE INSTRUCTIONS   Apply antibiotic ear drops to the ear canal as prescribed by your caregiver.  Only take over-the-counter or prescription medicines for pain, discomfort, or fever as directed by your caregiver.  If you have diabetes, follow any additional treatment instructions from your caregiver.  Keep all  follow-up appointments as directed by your caregiver. SEEK MEDICAL CARE IF:   You have a fever.  Your ear is still red, swollen, painful, or draining pus after 3 days.  Your redness, swelling, or pain gets worse.  You have a severe headache.  You have redness, swelling, pain, or tenderness in the area behind your ear. MAKE SURE YOU:   Understand these instructions.  Will watch your condition.  Will get help right away if you are not doing well or get worse. Document Released: 09/30/2005 Document Revised: 12/23/2011 Document Reviewed: 10/17/2011 ExitCare Patient Information 2014 [employer].  

## 2013-05-14 NOTE — Assessment & Plan Note (Signed)
Tolerable. Encouraged good posture, regular stretching and report any worsening pain

## 2013-05-14 NOTE — Assessment & Plan Note (Signed)
Mild, encouraged increased leafy greens, daily MVI and try cooking in cast iron

## 2013-05-14 NOTE — Assessment & Plan Note (Signed)
No bad episodes since Maxalt prescribed, has this to try as needed, encouraged good rest, exercise, hydration

## 2013-05-17 ENCOUNTER — Ambulatory Visit: Payer: 59 | Admitting: Family Medicine

## 2013-05-17 ENCOUNTER — Other Ambulatory Visit: Payer: Self-pay | Admitting: Family Medicine

## 2013-07-27 ENCOUNTER — Encounter: Payer: Self-pay | Admitting: Family Medicine

## 2013-07-29 MED ORDER — LISDEXAMFETAMINE DIMESYLATE 50 MG PO CAPS: 50.0000 mg | ORAL_CAPSULE | ORAL | Status: DC

## 2013-08-19 ENCOUNTER — Other Ambulatory Visit: Payer: Self-pay

## 2014-03-30 ENCOUNTER — Telehealth: Payer: Self-pay | Admitting: Family Medicine

## 2014-03-30 ENCOUNTER — Other Ambulatory Visit: Payer: Self-pay

## 2014-03-30 ENCOUNTER — Ambulatory Visit: Payer: Managed Care, Other (non HMO) | Admitting: Family Medicine

## 2014-03-30 MED ORDER — LISDEXAMFETAMINE DIMESYLATE 50 MG PO CAPS: 50.0000 mg | ORAL_CAPSULE | ORAL | Status: DC

## 2014-03-30 NOTE — Telephone Encounter (Signed)
Per md ok to write 1 month of vyvanse

## 2014-03-30 NOTE — Telephone Encounter (Signed)
Patient showed up late to her appointment. Scheduled her to be seen on 05/06/14 and put her on wait list.

## 2014-04-07 ENCOUNTER — Encounter: Payer: Self-pay | Admitting: Family Medicine

## 2014-04-08 ENCOUNTER — Encounter: Payer: Self-pay | Admitting: Family Medicine

## 2014-04-08 ENCOUNTER — Ambulatory Visit (INDEPENDENT_AMBULATORY_CARE_PROVIDER_SITE_OTHER): Payer: BC Managed Care – PPO | Admitting: Family Medicine

## 2014-04-08 VITALS — BP 120/82 | HR 75 | Temp 98.5°F | Ht 64.5 in | Wt 188.1 lb

## 2014-04-08 DIAGNOSIS — R41840 Attention and concentration deficit: Secondary | ICD-10-CM

## 2014-04-08 DIAGNOSIS — F988 Other specified behavioral and emotional disorders with onset usually occurring in childhood and adolescence: Secondary | ICD-10-CM

## 2014-04-08 DIAGNOSIS — F32A Depression, unspecified: Secondary | ICD-10-CM

## 2014-04-08 DIAGNOSIS — E663 Overweight: Secondary | ICD-10-CM

## 2014-04-08 DIAGNOSIS — F329 Major depressive disorder, single episode, unspecified: Secondary | ICD-10-CM

## 2014-04-08 DIAGNOSIS — N946 Dysmenorrhea, unspecified: Secondary | ICD-10-CM

## 2014-04-08 DIAGNOSIS — F338 Other recurrent depressive disorders: Secondary | ICD-10-CM

## 2014-04-08 DIAGNOSIS — F3289 Other specified depressive episodes: Secondary | ICD-10-CM

## 2014-04-08 DIAGNOSIS — F39 Unspecified mood [affective] disorder: Secondary | ICD-10-CM

## 2014-04-08 MED ORDER — LISDEXAMFETAMINE DIMESYLATE 50 MG PO CAPS: 50.0000 mg | ORAL_CAPSULE | ORAL | Status: DC

## 2014-04-08 MED ORDER — CITALOPRAM HYDROBROMIDE 20 MG PO TABS: ORAL_TABLET | ORAL | Status: DC

## 2014-04-08 NOTE — Patient Instructions (Signed)
Depression, Adult Depression refers to feeling sad, low, down in the dumps, blue, gloomy, or empty. In general, there are two kinds of depression: 1. Depression that we all experience from time to time because of upsetting life experiences, including the loss of a job or the ending of a relationship (normal sadness or normal grief). This kind of depression is considered normal, is short lived, and resolves within a few days to 2 weeks. (Depression experienced after the loss of a loved one is called bereavement. Bereavement often lasts longer than 2 weeks but normally gets better with time.) 2. Clinical depression, which lasts longer than normal sadness or normal grief or interferes with your ability to function at home, at work, and in school. It also interferes with your personal relationships. It affects almost every aspect of your life. Clinical depression is an illness. Symptoms of depression also can be caused by conditions other than normal sadness and grief or clinical depression. Examples of these conditions are listed as follows:  Physical illness--Some physical illnesses, including underactive thyroid gland (hypothyroidism), severe anemia, specific types of cancer, diabetes, uncontrolled seizures, heart and lung problems, strokes, and chronic pain are commonly associated with symptoms of depression.  Side effects of some prescription medicine--In some people, certain types of prescription medicine can cause symptoms of depression.  Substance abuse--Abuse of alcohol and illicit drugs can cause symptoms of depression. SYMPTOMS Symptoms of normal sadness and normal grief include the following:  Feeling sad or crying for short periods of time.  Not caring about anything (apathy).  Difficulty sleeping or sleeping too much.  No longer able to enjoy the things you used to enjoy.  Desire to be by oneself all the time (social isolation).  Lack of energy or motivation.  Difficulty  concentrating or remembering.  Change in appetite or weight.  Restlessness or agitation. Symptoms of clinical depression include the same symptoms of normal sadness or normal grief and also the following symptoms:  Feeling sad or crying all the time.  Feelings of guilt or worthlessness.  Feelings of hopelessness or helplessness.  Thoughts of suicide or the desire to harm yourself (suicidal ideation).  Loss of touch with reality (psychotic symptoms). Seeing or hearing things that are not real (hallucinations) or having false beliefs about your life or the people around you (delusions and paranoia). DIAGNOSIS  The diagnosis of clinical depression usually is based on the severity and duration of the symptoms. Your caregiver also will ask you questions about your medical history and substance use to find out if physical illness, use of prescription medicine, or substance abuse is causing your depression. Your caregiver also may order blood tests. TREATMENT  Typically, normal sadness and normal grief do not require treatment. However, sometimes antidepressant medicine is prescribed for bereavement to ease the depressive symptoms until they resolve. The treatment for clinical depression depends on the severity of your symptoms but typically includes antidepressant medicine, counseling with a mental health professional, or a combination of both. Your caregiver will help to determine what treatment is best for you. Depression caused by physical illness usually goes away with appropriate medical treatment of the illness. If prescription medicine is causing depression, talk with your caregiver about stopping the medicine, decreasing the dose, or substituting another medicine. Depression caused by abuse of alcohol or illicit drugs abuse goes away with abstinence from these substances. Some adults need professional help in order to stop drinking or using drugs. SEEK IMMEDIATE CARE IF:  You have thoughts  about   hurting yourself or others.  You lose touch with reality (have psychotic symptoms).  You are taking medicine for depression and have a serious side effect. FOR MORE INFORMATION National Alliance on Mental Illness: www.nami.org National Institute of Mental Health: www.nimh.nih.gov Document Released: 09/27/2000 Document Revised: 03/31/2012 Document Reviewed: 12/30/2011 ExitCare Patient Information 2015 [employer]. This information is not intended to replace advice given to you by your health care provider. Make sure you discuss any questions you have with your health care provider.  

## 2014-04-08 NOTE — Assessment & Plan Note (Signed)
Restart Vyvanse

## 2014-04-08 NOTE — Progress Notes (Signed)
Pre visit review using our clinic review tool, if applicable. No additional management support is needed unless otherwise documented below in the visit note. 

## 2014-04-10 ENCOUNTER — Encounter: Payer: Self-pay | Admitting: Family Medicine

## 2014-04-10 DIAGNOSIS — F988 Other specified behavioral and emotional disorders with onset usually occurring in childhood and adolescence: Secondary | ICD-10-CM | POA: Insufficient documentation

## 2014-04-10 NOTE — Assessment & Plan Note (Signed)
Worsening, will start Citalopram several of her family members are doing well on this medication

## 2014-04-10 NOTE — Assessment & Plan Note (Signed)
Ibuprofen prn and consider hormones if persists

## 2014-04-10 NOTE — Progress Notes (Signed)
Patient ID: Bonnie Lewis, female   DOB: 13-Mar-1986, 28 y.o.   MRN: [id number] Bonnie Lewis [id number] [birth year] 04/10/2014      Progress Note-Follow Up  Subjective  Chief Complaint  Chief Complaint  Patient presents with  . Follow-up    wants to restart vyvanse    HPI  Patient is a 28 year old female in today for routine medical care. She is in today to restart her Vyvanse which does help her ADD. She has been off of it for quite some time for financial reasons. She also acknowledges she is struggling with worsening depression. Has been working full-time and is less active. Is frustrated with weight gain. Is also complaining of worsening painful  Periods. Denies CP/palp/SOB/HA/congestion/fevers/GI or GU c/o. Taking meds as prescribed  Past Medical History  Diagnosis Date  . ADD (attention deficit disorder)   . Bronchitis 08/11/2011  . LLQ pain 08/11/2011  . Anemia 09/12/2011  . Insomnia 09/12/2011  . Preventative health care 09/16/2011  . Seasonal affective disorder 10/19/2012  . Cervical cancer 10/19/2012  . Cervical cancer screening 10/19/2012  . Vaginitis 10/19/2012  . Dysmenorrhea 10/20/2012  . Dyslipidemia 10/20/2012  . Migraine 05/14/2013    History reviewed. No pertinent past surgical history.  Family History  Problem Relation Age of Onset  . Asthma Father   . Depression Father     and anxiety  . Allergies Father   . Irritable bowel syndrome Father   . Dementia Father   . Depression Sister   . Irritable bowel syndrome Sister   . Migraines Sister   . Depression Brother   . Allergies Brother   . Depression Maternal Grandmother     Anxiety  . Hypertension Maternal Grandmother   . ADD / ADHD Maternal Grandmother   . Menstrual problems Maternal Grandmother     heavy bleeding requiring hysterectomy  . Depression Paternal Grandmother   . Cardiomyopathy Paternal Grandmother   . Menstrual problems Paternal Grandmother   . Hyperlipidemia Mother   . Anemia Sister   . Coronary  artery disease Paternal Grandfather   . Cancer Paternal Aunt     History   Social History  . Marital Status: Single    Spouse Name: N/A    Number of Children: N/A  . Years of Education: N/A   Occupational History  . Not on file.   Social History Main Topics  . Smoking status: Never Smoker   . Smokeless tobacco: Never Used  . Alcohol Use: No  . Drug Use: No  . Sexual Activity: No   Other Topics Concern  . Not on file   Social History Narrative  . No narrative on file    Current Outpatient Prescriptions on File Prior to Visit  Medication Sig Dispense Refill  . Multiple Vitamin (MULTIVITAMIN) tablet Take 1 tablet by mouth daily. Rainbow lite      . azelaic acid (AZELEX) 20 % cream Apply topically 2 (two) times daily. After skin is thoroughly washed and patted dry, gently but thoroughly massage a thin film of azelaic acid cream into the affected area twice daily, in the morning and evening.  50 g  2  . rizatriptan (MAXALT) 10 MG tablet TAKE 1 TABLET (10 MG TOTAL) BY MOUTH AS NEEDED FOR MIGRAINE. MAY REPEAT IN 2 HOURS IF NEEDED  10 tablet  0  . Sulfacetamide Sodium-Sulfur (AVAR CLEANSER) 10-5 % EMUL Apply to skin twice daily as directed  227 g  1   No current facility-administered medications  on file prior to visit.    Allergies  Allergen Reactions  . Sulfonamide Derivatives     Review of Systems  Review of Systems  Constitutional: Negative for fever and malaise/fatigue.  HENT: Negative for congestion.   Eyes: Negative for discharge.  Respiratory: Negative for shortness of breath.   Cardiovascular: Negative for chest pain, palpitations and leg swelling.  Gastrointestinal: Negative for nausea, abdominal pain and diarrhea.  Genitourinary: Negative for dysuria.  Musculoskeletal: Negative for falls.  Skin: Negative for rash.  Neurological: Negative for loss of consciousness and headaches.  Endo/Heme/Allergies: Negative for polydipsia.  Psychiatric/Behavioral: Negative  for depression and suicidal ideas. The patient is not nervous/anxious and does not have insomnia.     Objective  BP 120/82  Pulse 75  Temp(Src) 98.5 F (36.9 C) (Oral)  Ht 5' 4.5" (1.638 m)  Wt 188 lb 1.3 oz (85.313 kg)  BMI 31.80 kg/m2  SpO2 97%  LMP 03/29/2014  Physical Exam  Physical Exam  Constitutional: She is oriented to person, place, and time and well-developed, well-nourished, and in no distress. No distress.  HENT:  Head: Normocephalic and atraumatic.  Eyes: Conjunctivae are normal.  Neck: Neck supple. No thyromegaly present.  Cardiovascular: Normal rate, regular rhythm and normal heart sounds.   No murmur heard. Pulmonary/Chest: Effort normal and breath sounds normal. She has no wheezes.  Abdominal: She exhibits no distension and no mass.  Musculoskeletal: She exhibits no edema.  Lymphadenopathy:    She has no cervical adenopathy.  Neurological: She is alert and oriented to person, place, and time.  Skin: Skin is warm and dry. No rash noted. She is not diaphoretic.  Psychiatric: Memory, affect and judgment normal.    Lab Results  Component Value Date   TSH 1.79 05/05/2013   Lab Results  Component Value Date   WBC 7.7 05/05/2013   HGB 11.5* 05/05/2013   HCT 33.9* 05/05/2013   MCV 87.7 05/05/2013   PLT 288.0 05/05/2013   Lab Results  Component Value Date   CREATININE 0.8 05/05/2013   BUN 16 05/05/2013   NA 137 05/05/2013   K 3.8 05/05/2013   CL 104 05/05/2013   CO2 26 05/05/2013   Lab Results  Component Value Date   ALT 19 05/05/2013   AST 21 05/05/2013   ALKPHOS 45 05/05/2013   BILITOT 0.5 05/05/2013   Lab Results  Component Value Date   CHOL 177 05/05/2013   Lab Results  Component Value Date   HDL 73.90 05/05/2013   Lab Results  Component Value Date   LDLCALC 96 05/05/2013   Lab Results  Component Value Date   TRIG 38.0 05/05/2013   Lab Results  Component Value Date   CHOLHDL 2 05/05/2013     Assessment & Plan  ATTENTION OR CONCENTRATION  DEFICIT Restart Vyvanse   OVERWEIGHT Encouraged DASH diet, decrease po intake and increase exercise as tolerated. Needs 7-8 hours of sleep nightly. Avoid trans fats, eat small, frequent meals every 4-5 hours with lean proteins, complex carbs and healthy fats. Minimize simple carbs, GMO foods.  Dysmenorrhea Ibuprofen prn and consider hormones if persists  ADD (attention deficit disorder) Restarted Vyvanse per patient request today, completed forms for her to receive assistance  Seasonal affective disorder Worsening, will start Citalopram several of her family members are doing well on this medication

## 2014-04-10 NOTE — Assessment & Plan Note (Signed)
Restarted Vyvanse per patient request today, completed forms for her to receive assistance

## 2014-04-10 NOTE — Assessment & Plan Note (Signed)
Encouraged DASH diet, decrease po intake and increase exercise as tolerated. Needs 7-8 hours of sleep nightly. Avoid trans fats, eat small, frequent meals every 4-5 hours with lean proteins, complex carbs and healthy fats. Minimize simple carbs, GMO foods. 

## 2014-04-22 ENCOUNTER — Ambulatory Visit: Payer: Managed Care, Other (non HMO) | Admitting: Family Medicine

## 2014-05-06 ENCOUNTER — Ambulatory Visit: Payer: Managed Care, Other (non HMO) | Admitting: Family Medicine

## 2014-06-09 ENCOUNTER — Encounter: Payer: Self-pay | Admitting: Family Medicine

## 2014-06-09 ENCOUNTER — Ambulatory Visit (INDEPENDENT_AMBULATORY_CARE_PROVIDER_SITE_OTHER): Payer: BC Managed Care – PPO | Admitting: Family Medicine

## 2014-06-09 VITALS — BP 110/64 | HR 74 | Temp 98.3°F | Ht 64.5 in | Wt 181.1 lb

## 2014-06-09 DIAGNOSIS — F988 Other specified behavioral and emotional disorders with onset usually occurring in childhood and adolescence: Secondary | ICD-10-CM

## 2014-06-09 DIAGNOSIS — E663 Overweight: Secondary | ICD-10-CM

## 2014-06-09 DIAGNOSIS — Z23 Encounter for immunization: Secondary | ICD-10-CM

## 2014-06-09 DIAGNOSIS — F39 Unspecified mood [affective] disorder: Secondary | ICD-10-CM

## 2014-06-09 DIAGNOSIS — F338 Other recurrent depressive disorders: Secondary | ICD-10-CM

## 2014-06-09 DIAGNOSIS — F3289 Other specified depressive episodes: Secondary | ICD-10-CM

## 2014-06-09 DIAGNOSIS — F32A Depression, unspecified: Secondary | ICD-10-CM

## 2014-06-09 DIAGNOSIS — N946 Dysmenorrhea, unspecified: Secondary | ICD-10-CM

## 2014-06-09 DIAGNOSIS — L700 Acne vulgaris: Secondary | ICD-10-CM

## 2014-06-09 DIAGNOSIS — F329 Major depressive disorder, single episode, unspecified: Secondary | ICD-10-CM

## 2014-06-09 DIAGNOSIS — L708 Other acne: Secondary | ICD-10-CM

## 2014-06-09 MED ORDER — LISDEXAMFETAMINE DIMESYLATE 50 MG PO CAPS: 50.0000 mg | ORAL_CAPSULE | ORAL | Status: DC

## 2014-06-09 MED ORDER — SULFACETAMIDE SODIUM-SULFUR 10-5 % EX EMUL: CUTANEOUS | Status: DC

## 2014-06-09 MED ORDER — CITALOPRAM HYDROBROMIDE 20 MG PO TABS: 20.0000 mg | ORAL_TABLET | Freq: Every day | ORAL | Status: DC

## 2014-06-09 MED ORDER — AZELAIC ACID 20 % EX CREA: TOPICAL_CREAM | Freq: Two times a day (BID) | CUTANEOUS | Status: DC

## 2014-06-09 NOTE — Assessment & Plan Note (Signed)
Acceptable response to Vyvanse at 50 mg daily no changes for now but has required 70 mg in split dosing in the past.

## 2014-06-09 NOTE — Assessment & Plan Note (Signed)
With irregular menses, weight gain, acne and FH of mother with significant ovarian cysts. Consider work up for PCOS, patient offered referral vs workup today but elects to wait for now will consider at next visit.

## 2014-06-09 NOTE — Progress Notes (Signed)
Pre visit review using our clinic review tool, if applicable. No additional management support is needed unless otherwise documented below in the visit note. 

## 2014-06-09 NOTE — Progress Notes (Signed)
Patient ID: Bonnie Lewis, female   DOB: 1985/11/17, 28 y.o.   MRN: [id number] Bonnie Lewis [id number] 1986-10-09 06/09/2014      Progress Note-Follow Up  Subjective  Chief Complaint  Chief Complaint  Patient presents with  . Follow-up    6-12 week   . Injections    flu    HPI  Patient is a 28 year old female in today for routine medical care. Patient in todayf or follow up on medication changes. Is having a good response to vyvanse, 50 mg appears adequate and she is concentrating better. No SE such as increased HA, insomnia, anxiety, cp, palp. Started Citalopram a week later and had some insomnia initially with that but after moving her dose to am has been fine. Is frustrated with acne and weight gain and is questioning possible diagnosis of PCOS? Denies CP/palp/SOB/HA/congestion/fevers/GI or GU c/o. Taking meds as prescribed   Past Medical History  Diagnosis Date  . ADD (attention deficit disorder)   . Bronchitis 08/11/2011  . LLQ pain 08/11/2011  . Anemia 09/12/2011  . Insomnia 09/12/2011  . Preventative health care 09/16/2011  . Seasonal affective disorder 10/19/2012  . Cervical cancer 10/19/2012  . Cervical cancer screening 10/19/2012  . Vaginitis 10/19/2012  . Dysmenorrhea 10/20/2012  . Dyslipidemia 10/20/2012  . Migraine 05/14/2013    History reviewed. No pertinent past surgical history.  Family History  Problem Relation Age of Onset  . Asthma Father   . Depression Father     and anxiety  . Allergies Father   . Irritable bowel syndrome Father   . Dementia Father   . Depression Sister   . Irritable bowel syndrome Sister   . Migraines Sister   . Depression Brother   . Allergies Brother   . Depression Maternal Grandmother     Anxiety  . Hypertension Maternal Grandmother   . ADD / ADHD Maternal Grandmother   . Menstrual problems Maternal Grandmother     heavy bleeding requiring hysterectomy  . Depression Paternal Grandmother   . Cardiomyopathy Paternal Grandmother   .  Menstrual problems Paternal Grandmother   . Hyperlipidemia Mother   . Anemia Sister   . Coronary artery disease Paternal Grandfather   . Cancer Paternal Aunt     History   Social History  . Marital Status: Single    Spouse Name: N/A    Number of Children: N/A  . Years of Education: N/A   Occupational History  . Not on file.   Social History Main Topics  . Smoking status: Never Smoker   . Smokeless tobacco: Never Used  . Alcohol Use: No  . Drug Use: No  . Sexual Activity: No   Other Topics Concern  . Not on file   Social History Narrative  . No narrative on file    Current Outpatient Prescriptions on File Prior to Visit  Medication Sig Dispense Refill  . azelaic acid (AZELEX) 20 % cream Apply topically 2 (two) times daily. After skin is thoroughly washed and patted dry, gently but thoroughly massage a thin film of azelaic acid cream into the affected area twice daily, in the morning and evening.  50 g  2  . citalopram (CELEXA) 20 MG tablet 1/2 tab po daily x 7 days, then 1 tab po daily  30 tablet  3  . lisdexamfetamine (VYVANSE) 50 MG capsule Take 1 capsule (50 mg total) by mouth every morning. RX for 05-30-14  30 capsule  0  . Multiple Vitamin (MULTIVITAMIN)  tablet Take 1 tablet by mouth daily. Rainbow lite      . rizatriptan (MAXALT) 10 MG tablet TAKE 1 TABLET (10 MG TOTAL) BY MOUTH AS NEEDED FOR MIGRAINE. MAY REPEAT IN 2 HOURS IF NEEDED  10 tablet  0  . Sulfacetamide Sodium-Sulfur (AVAR CLEANSER) 10-5 % EMUL Apply to skin twice daily as directed  227 g  1   No current facility-administered medications on file prior to visit.    Allergies  Allergen Reactions  . Sulfonamide Derivatives     Review of Systems  Review of Systems  Constitutional: Negative for fever and malaise/fatigue.  HENT: Negative for congestion.   Eyes: Negative for discharge.  Respiratory: Negative for shortness of breath.   Cardiovascular: Negative for chest pain, palpitations and leg  swelling.  Gastrointestinal: Negative for nausea, abdominal pain and diarrhea.  Genitourinary: Negative for dysuria.  Musculoskeletal: Negative for falls.  Skin: Negative for rash.  Neurological: Negative for loss of consciousness and headaches.  Endo/Heme/Allergies: Negative for polydipsia.  Psychiatric/Behavioral: Negative for depression and suicidal ideas. The patient is not nervous/anxious and does not have insomnia.     Objective  BP 110/64  Pulse 74  Temp(Src) 98.3 F (36.8 C) (Oral)  Ht 5' 4.5" (1.638 m)  Wt 181 lb 1.3 oz (82.137 kg)  BMI 30.61 kg/m2  SpO2 99%  LMP 05/29/2014  Physical Exam  Physical Exam  Constitutional: She is oriented to person, place, and time and well-developed, well-nourished, and in no distress. No distress.  HENT:  Head: Normocephalic and atraumatic.  Eyes: Conjunctivae are normal.  Neck: Neck supple. No thyromegaly present.  Cardiovascular: Normal rate, regular rhythm and normal heart sounds.   No murmur heard. Pulmonary/Chest: Effort normal and breath sounds normal. She has no wheezes.  Abdominal: She exhibits no distension and no mass.  Musculoskeletal: She exhibits no edema.  Lymphadenopathy:    She has no cervical adenopathy.  Neurological: She is alert and oriented to person, place, and time.  Skin: Skin is warm and dry. No rash noted. She is not diaphoretic.  Psychiatric: Memory, affect and judgment normal.    Lab Results  Component Value Date   TSH 1.79 05/05/2013   Lab Results  Component Value Date   WBC 7.7 05/05/2013   HGB 11.5* 05/05/2013   HCT 33.9* 05/05/2013   MCV 87.7 05/05/2013   PLT 288.0 05/05/2013   Lab Results  Component Value Date   CREATININE 0.8 05/05/2013   BUN 16 05/05/2013   NA 137 05/05/2013   K 3.8 05/05/2013   CL 104 05/05/2013   CO2 26 05/05/2013   Lab Results  Component Value Date   ALT 19 05/05/2013   AST 21 05/05/2013   ALKPHOS 45 05/05/2013   BILITOT 0.5 05/05/2013   Lab Results  Component Value  Date   CHOL 177 05/05/2013   Lab Results  Component Value Date   HDL 73.90 05/05/2013   Lab Results  Component Value Date   LDLCALC 96 05/05/2013   Lab Results  Component Value Date   TRIG 38.0 05/05/2013   Lab Results  Component Value Date   CHOLHDL 2 05/05/2013     Assessment & Plan  ADD (attention deficit disorder) Acceptable response to Vyvanse at 50 mg daily no changes for now but has required 70 mg in split dosing in the past.   Dysmenorrhea With irregular menses, weight gain, acne and FH of mother with significant ovarian cysts. Consider work up for PCOS, patient offered referral  vs workup today but elects to wait for now will consider at next visit.  ACNE VULGARIS Given refills on her topical meds today  OVERWEIGHT Encouraged DASH diet, decrease po intake and increase exercise as tolerated. Needs 7-8 hours of sleep nightly. Avoid trans fats, eat small, frequent meals every 4-5 hours with lean proteins, complex carbs and healthy fats. Minimize simple carbs, GMO foods.  Seasonal affective disorder With some persistent depression lately, seems to be responding to Citalopram but has only been on 20 mg for 4-6 weeks now. Will reassess in 8 weeks before making changes unless she has any further difficulty. She will contact us as needed

## 2014-06-09 NOTE — Assessment & Plan Note (Signed)
Given refills on her topical meds today

## 2014-06-09 NOTE — Assessment & Plan Note (Signed)
With some persistent depression lately, seems to be responding to Citalopram but has only been on 20 mg for 4-6 weeks now. Will reassess in 8 weeks before making changes unless she has any further difficulty. She will contact us as needed

## 2014-06-09 NOTE — Assessment & Plan Note (Signed)
Encouraged DASH diet, decrease po intake and increase exercise as tolerated. Needs 7-8 hours of sleep nightly. Avoid trans fats, eat small, frequent meals every 4-5 hours with lean proteins, complex carbs and healthy fats. Minimize simple carbs, GMO foods. 

## 2014-07-03 ENCOUNTER — Encounter: Payer: Self-pay | Admitting: Family Medicine

## 2014-08-01 ENCOUNTER — Telehealth: Payer: Self-pay | Admitting: Family Medicine

## 2014-08-01 DIAGNOSIS — F988 Other specified behavioral and emotional disorders with onset usually occurring in childhood and adolescence: Secondary | ICD-10-CM

## 2014-08-01 MED ORDER — LISDEXAMFETAMINE DIMESYLATE 50 MG PO CAPS: 50.0000 mg | ORAL_CAPSULE | ORAL | Status: DC

## 2014-08-01 NOTE — Telephone Encounter (Signed)
Yes we will print and give to [facility] to give to patient tomorrow.  pts mother informed

## 2014-08-01 NOTE — Telephone Encounter (Signed)
Caller name: Bonnie Lewis, Case Relation to pt: self  Call back number: [phone]   Reason for call:   pt requesting a refill lisdexamfetamine (VYVANSE) 50 MG capsule (pt is coming in for an acute appointment 08/02/14 and would like to know if she can pick up RX then)

## 2014-08-02 ENCOUNTER — Encounter: Payer: Self-pay | Admitting: Physician Assistant

## 2014-08-02 ENCOUNTER — Ambulatory Visit (INDEPENDENT_AMBULATORY_CARE_PROVIDER_SITE_OTHER): Payer: BC Managed Care – PPO | Admitting: Physician Assistant

## 2014-08-02 VITALS — BP 131/92 | HR 82 | Temp 98.2°F | Wt 182.0 lb

## 2014-08-02 DIAGNOSIS — M546 Pain in thoracic spine: Secondary | ICD-10-CM

## 2014-08-02 MED ORDER — TRAMADOL HCL 50 MG PO TABS: 25.0000 mg | ORAL_TABLET | Freq: Three times a day (TID) | ORAL | Status: DC | PRN

## 2014-08-02 MED ORDER — METHOCARBAMOL 500 MG PO TABS: 500.0000 mg | ORAL_TABLET | Freq: Three times a day (TID) | ORAL | Status: DC

## 2014-08-02 NOTE — Patient Instructions (Signed)
Please take medications as directed.  Do not take the Robaxin before driving or operating machinery. Apply topical Aspercreme to your back.  Continue wearing your support brace.  Avoid heavy lifting or overexertion.  Follow-up if symptoms are not improving.

## 2014-08-02 NOTE — Progress Notes (Signed)
Patient presents to clinic today c/o right sided thoracic back pain over the past few days.  Patient with history of scoliosis.  Endorses some heavy lifting at work.  Denies weakness, numbness of upper extremity.  Denies radiation of pain elsewhere.  Has not taken anything for pain. Has a lumbar orthosis at home.  Past Medical History  Diagnosis Date  . ADD (attention deficit disorder)   . Bronchitis 08/11/2011  . LLQ pain 08/11/2011  . Anemia 09/12/2011  . Insomnia 09/12/2011  . Preventative health care 09/16/2011  . Seasonal affective disorder 10/19/2012  . Cervical cancer 10/19/2012  . Cervical cancer screening 10/19/2012  . Vaginitis 10/19/2012  . Dysmenorrhea 10/20/2012  . Dyslipidemia 10/20/2012  . Migraine 05/14/2013    Current Outpatient Prescriptions on File Prior to Visit  Medication Sig Dispense Refill  . azelaic acid (AZELEX) 20 % cream Apply topically 2 (two) times daily. After skin is thoroughly washed and patted dry, gently but thoroughly massage a thin film of azelaic acid cream into the affected area twice daily, in the morning and evening.  50 g  2  . citalopram (CELEXA) 20 MG tablet Take 1 tablet (20 mg total) by mouth daily.  30 tablet  2  . lisdexamfetamine (VYVANSE) 50 MG capsule Take 1 capsule (50 mg total) by mouth every morning. RX November 2015  30 capsule  0  . Multiple Vitamin (MULTIVITAMIN) tablet Take 1 tablet by mouth daily. Rainbow lite      . rizatriptan (MAXALT) 10 MG tablet TAKE 1 TABLET (10 MG TOTAL) BY MOUTH AS NEEDED FOR MIGRAINE. MAY REPEAT IN 2 HOURS IF NEEDED  10 tablet  0  . Sulfacetamide Sodium-Sulfur (AVAR CLEANSER) 10-5 % EMUL Apply to skin twice daily as directed  227 g  1   No current facility-administered medications on file prior to visit.    Allergies  Allergen Reactions  . Sulfonamide Derivatives     Family History  Problem Relation Age of Onset  . Asthma Father   . Depression Father     and anxiety  . Allergies Father   . Irritable  bowel syndrome Father   . Dementia Father   . Depression Sister   . Irritable bowel syndrome Sister   . Migraines Sister   . Depression Brother   . Allergies Brother   . Depression Maternal Grandmother     Anxiety  . Hypertension Maternal Grandmother   . ADD / ADHD Maternal Grandmother   . Menstrual problems Maternal Grandmother     heavy bleeding requiring hysterectomy  . Depression Paternal Grandmother   . Cardiomyopathy Paternal Grandmother   . Menstrual problems Paternal Grandmother   . Hyperlipidemia Mother   . Anemia Sister   . Coronary artery disease Paternal Grandfather   . Cancer Paternal Aunt     History   Social History  . Marital Status: Single    Spouse Name: N/A    Number of Children: N/A  . Years of Education: N/A   Social History Main Topics  . Smoking status: Never Smoker   . Smokeless tobacco: Never Used  . Alcohol Use: No  . Drug Use: No  . Sexual Activity: No   Other Topics Concern  . None   Social History Narrative  . None   Review of Systems - See HPI.  All other ROS are negative.  BP 131/92  Pulse 82  Temp(Src) 98.2 F (36.8 C)  Wt 182 lb (82.555 kg)  SpO2 99%  Physical Exam  Vitals reviewed. Constitutional: She is oriented to person, place, and time and well-developed, well-nourished, and in no distress.  HENT:  Head: Normocephalic and atraumatic.  Eyes: Conjunctivae are normal.  Cardiovascular: Normal rate, regular rhythm, normal heart sounds and intact distal pulses.   Pulmonary/Chest: Effort normal and breath sounds normal. No respiratory distress. She has no wheezes. She has no rales. She exhibits no tenderness.  Musculoskeletal:       Thoracic back: She exhibits tenderness, pain and spasm. She exhibits no bony tenderness.       Lumbar back: Normal.       Back:  Neurological: She is alert and oriented to person, place, and time.  Skin: Skin is warm and dry. No rash noted.  Psychiatric: Affect normal.    Assessment/Plan: Right-sided thoracic back pain Rx Robaxin TID.  Patient instructed not to operate vehicle while on medication.  Rx Tramadol for severe pain.  Otherwise stick to OTC medication.  Topical Aspercreme.  Rest.  Avoid overexertion.  Patient given note for work to limit lifting and carrying.

## 2014-08-02 NOTE — Progress Notes (Signed)
Pre visit review using our clinic review tool, if applicable. No additional management support is needed unless otherwise documented below in the visit note. 

## 2014-08-03 DIAGNOSIS — M546 Pain in thoracic spine: Secondary | ICD-10-CM | POA: Insufficient documentation

## 2014-08-03 NOTE — Assessment & Plan Note (Signed)
Rx Robaxin TID.  Patient instructed not to operate vehicle while on medication.  Rx Tramadol for severe pain.  Otherwise stick to OTC medication.  Topical Aspercreme.  Rest.  Avoid overexertion.  Patient given note for work to limit lifting and carrying.

## 2014-08-11 ENCOUNTER — Ambulatory Visit: Payer: BC Managed Care – PPO | Admitting: Family Medicine

## 2014-08-25 ENCOUNTER — Other Ambulatory Visit: Payer: Self-pay | Admitting: Family Medicine

## 2014-08-25 ENCOUNTER — Encounter: Payer: Self-pay | Admitting: Family Medicine

## 2014-08-25 ENCOUNTER — Other Ambulatory Visit (HOSPITAL_COMMUNITY)
Admission: RE | Admit: 2014-08-25 | Discharge: 2014-08-25 | Disposition: A | Discharge status: Home / Self Care | Payer: BC Managed Care – PPO | Source: Ambulatory Visit | Attending: Family Medicine | Admitting: Family Medicine

## 2014-08-25 ENCOUNTER — Other Ambulatory Visit (HOSPITAL_BASED_OUTPATIENT_CLINIC_OR_DEPARTMENT_OTHER): Payer: BC Managed Care – PPO

## 2014-08-25 ENCOUNTER — Ambulatory Visit (INDEPENDENT_AMBULATORY_CARE_PROVIDER_SITE_OTHER): Payer: BC Managed Care – PPO | Admitting: Family Medicine

## 2014-08-25 VITALS — BP 115/72 | HR 68 | Temp 97.9°F | Ht 64.5 in | Wt 182.8 lb

## 2014-08-25 DIAGNOSIS — M546 Pain in thoracic spine: Secondary | ICD-10-CM

## 2014-08-25 DIAGNOSIS — F329 Major depressive disorder, single episode, unspecified: Secondary | ICD-10-CM

## 2014-08-25 DIAGNOSIS — N760 Acute vaginitis: Secondary | ICD-10-CM

## 2014-08-25 DIAGNOSIS — N926 Irregular menstruation, unspecified: Secondary | ICD-10-CM

## 2014-08-25 DIAGNOSIS — N921 Excessive and frequent menstruation with irregular cycle: Secondary | ICD-10-CM

## 2014-08-25 DIAGNOSIS — F988 Other specified behavioral and emotional disorders with onset usually occurring in childhood and adolescence: Secondary | ICD-10-CM

## 2014-08-25 DIAGNOSIS — F909 Attention-deficit hyperactivity disorder, unspecified type: Secondary | ICD-10-CM

## 2014-08-25 DIAGNOSIS — L700 Acne vulgaris: Secondary | ICD-10-CM

## 2014-08-25 DIAGNOSIS — R1030 Lower abdominal pain, unspecified: Secondary | ICD-10-CM

## 2014-08-25 DIAGNOSIS — F32A Depression, unspecified: Secondary | ICD-10-CM

## 2014-08-25 DIAGNOSIS — E663 Overweight: Secondary | ICD-10-CM

## 2014-08-25 MED ORDER — FLUCONAZOLE 150 MG PO TABS: ORAL_TABLET | ORAL | Status: DC

## 2014-08-25 MED ORDER — CITALOPRAM HYDROBROMIDE 20 MG PO TABS: 20.0000 mg | ORAL_TABLET | Freq: Every day | ORAL | Status: DC

## 2014-08-25 MED ORDER — LISDEXAMFETAMINE DIMESYLATE 50 MG PO CAPS: 50.0000 mg | ORAL_CAPSULE | Freq: Every day | ORAL | Status: DC

## 2014-08-25 MED ORDER — AZELAIC ACID 20 % EX CREA: TOPICAL_CREAM | Freq: Two times a day (BID) | CUTANEOUS | Status: DC

## 2014-08-25 MED ORDER — LISDEXAMFETAMINE DIMESYLATE 50 MG PO CAPS: 50.0000 mg | ORAL_CAPSULE | ORAL | Status: DC

## 2014-08-25 NOTE — Patient Instructions (Addendum)
Dermatology Dr Tonia Brooms and [place]  GYN: Dr Edwinna Areola   Polycystic Ovarian Syndrome Polycystic ovarian syndrome (PCOS) is a common hormonal disorder among women of reproductive age. Most women with PCOS grow many small cysts on their ovaries. PCOS can cause problems with your periods and make it difficult to get pregnant. It can also cause an increased risk of miscarriage with pregnancy. If left untreated, PCOS can lead to serious health problems, such as diabetes and heart disease. CAUSES The cause of PCOS is not fully understood, but genetics may be a factor. SIGNS AND SYMPTOMS   Infrequent or no menstrual periods.   Inability to get pregnant (infertility) because of not ovulating.   Increased growth of hair on the face, chest, stomach, back, thumbs, thighs, or toes.   Acne, oily skin, or dandruff.   Pelvic pain.   Weight gain or obesity, usually carrying extra weight around the waist.   Type 2 diabetes.   High cholesterol.   High blood pressure.   Female-pattern baldness or thinning hair.   Patches of thickened and dark brown or black skin on the neck, arms, breasts, or thighs.   Tiny excess flaps of skin (skin tags) in the armpits or neck area.   Excessive snoring and having breathing stop at times while asleep (sleep apnea).   Deepening of the voice.   Gestational diabetes when pregnant.  DIAGNOSIS  There is no single test to diagnose PCOS.   Your health care provider will:   Take a medical history.   Perform a pelvic exam.   Have ultrasonography done.   Check your female and female hormone levels.   Measure glucose or sugar levels in the blood.   Do other blood tests.   If you are producing too many female hormones, your health care provider will make sure it is from PCOS. At the physical exam, your health care provider will want to evaluate the areas of increased hair growth. Try to allow natural hair growth for a few days  before the visit.   During a pelvic exam, the ovaries may be enlarged or swollen because of the increased number of small cysts. This can be seen more easily by using vaginal ultrasonography or screening to examine the ovaries and lining of the uterus (endometrium) for cysts. The uterine lining may become thicker if you have not been having a regular period.  TREATMENT  Because there is no cure for PCOS, it needs to be managed to prevent problems. Treatments are based on your symptoms. Treatment is also based on whether you want to have a baby or whether you need contraception.  Treatment may include:   Progesterone hormone to start a menstrual period.   Birth control pills to make you have regular menstrual periods.   Medicines to make you ovulate, if you want to get pregnant.   Medicines to control your insulin.   Medicine to control your blood pressure.   Medicine and diet to control your high cholesterol and triglycerides in your blood.  Medicine to reduce excessive hair growth.  Surgery, making small holes in the ovary, to decrease the amount of female hormone production. This is done through a long, lighted tube (laparoscope) placed into the pelvis through a tiny incision in the lower abdomen.  HOME CARE INSTRUCTIONS  Only take over-the-counter or prescription medicine as directed by your health care provider.  Pay attention to the foods you eat and your activity levels. This can help reduce the effects of  PCOS.  Keep your weight under control.  Eat foods that are low in carbohydrate and high in fiber.  Exercise regularly. SEEK MEDICAL CARE IF:  Your symptoms do not get better with medicine.  You have new symptoms. Document Released: 01/24/2005 Document Revised: 07/21/2013 Document Reviewed: 03/18/2013 [facility] Patient Information 2015 [place], [place]. This information is not intended to replace advice given to you by your health care provider. Make sure you  discuss any questions you have with your health care provider.

## 2014-08-25 NOTE — Progress Notes (Signed)
Pre visit review using our clinic review tool, if applicable. No additional management support is needed unless otherwise documented below in the visit note. 

## 2014-08-25 NOTE — Progress Notes (Signed)
Patient ID: Bonnie Lewis, female   DOB: Aug 11, 1986, 28 y.o.   MRN: [id number] Bonnie Lewis [id number] Dec 06, 1985 08/25/2014      Progress Note-Follow Up  Subjective  Chief Complaint  Chief Complaint  Patient presents with  . Follow-up    2 month    HPI  Patient is a 28 year old female in today for routine medical care. Struggling with increased right thoracic back pain recently. 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 She had to take full tabs and did have some mild nausea.  Has noted some vaginal dishcarge and itching. Took 1 Diflucan a couple days ago. This helped some. It noting itching and some burning. Mild dysuria, is on her cycle. Feels fatigue and malaise but feels she is coming down with a cold, increased congestion, no runny nose, mild throat irritation. No ear pain. No GI c/o. No increased back or abdominal pain  Past Medical History  Diagnosis Date  . ADD (attention deficit disorder)   . Bronchitis 08/11/2011  . LLQ pain 08/11/2011  . Anemia 09/12/2011  . Insomnia 09/12/2011  . Preventative health care 09/16/2011  . Seasonal affective disorder 10/19/2012  . Cervical cancer 10/19/2012  . Cervical cancer screening 10/19/2012  . Vaginitis 10/19/2012  . Dysmenorrhea 10/20/2012  . Dyslipidemia 10/20/2012  . Migraine 05/14/2013    History reviewed. No pertinent past surgical history.  Family History  Problem Relation Age of Onset  . Asthma Father   . Depression Father     and anxiety  . Allergies Father   . Irritable bowel syndrome Father   . Dementia Father   . Depression Sister   . Irritable bowel syndrome Sister   . Migraines Sister   . Depression Brother   . Allergies Brother   . Depression Maternal Grandmother     Anxiety  . Hypertension Maternal Grandmother   . ADD / ADHD Maternal Grandmother   . Menstrual problems Maternal Grandmother     heavy  bleeding requiring hysterectomy  . Depression Paternal Grandmother   . Cardiomyopathy Paternal Grandmother   . Menstrual problems Paternal Grandmother   . Hyperlipidemia Mother   . Anemia Sister   . Coronary artery disease Paternal Grandfather   . Cancer Paternal Aunt     History   Social History  . Marital Status: Single    Spouse Name: N/A    Number of Children: N/A  . Years of Education: N/A   Occupational History  . Not on file.   Social History Main Topics  . Smoking status: Never Smoker   . Smokeless tobacco: Never Used  . Alcohol Use: No  . Drug Use: No  . Sexual Activity: No   Other Topics Concern  . Not on file   Social History Narrative    Current Outpatient Prescriptions on File Prior to Visit  Medication Sig Dispense Refill  . methocarbamol (ROBAXIN) 500 MG tablet Take 1 tablet (500 mg total) by mouth 3 (three) times daily. 60 tablet 0  . Multiple Vitamin (MULTIVITAMIN) tablet Take 1 tablet by mouth daily. Rainbow lite    . rizatriptan (MAXALT) 10 MG tablet TAKE 1 TABLET (10 MG TOTAL) BY MOUTH AS NEEDED FOR MIGRAINE. MAY REPEAT IN 2 HOURS IF NEEDED 10 tablet 0  . Sulfacetamide Sodium-Sulfur (AVAR CLEANSER) 10-5 % EMUL Apply to skin twice daily as directed 227 g 1  .  traMADol (ULTRAM) 50 MG tablet Take 0.5 tablets (25 mg total) by mouth every 8 (eight) hours as needed. 30 tablet 0   No current facility-administered medications on file prior to visit.    Allergies  Allergen Reactions  . Sulfonamide Derivatives     Review of Systems  Review of Systems  Constitutional: Negative for fever and malaise/fatigue.  HENT: Negative for congestion.   Eyes: Negative for discharge.  Respiratory: Negative for shortness of breath.   Cardiovascular: Negative for chest pain, palpitations and leg swelling.  Gastrointestinal: Negative for nausea, abdominal pain and diarrhea.  Genitourinary: Negative for dysuria.  Musculoskeletal: Positive for back pain. Negative for  falls.       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  Skin: Negative for rash.  Neurological: Negative for loss of consciousness and headaches.  Endo/Heme/Allergies: Negative for polydipsia.  Psychiatric/Behavioral: Negative for depression and suicidal ideas. The patient is not nervous/anxious and does not have insomnia.     Objective  BP 115/72 mmHg  Pulse 68  Temp(Src) 97.9 F (36.6 C) (Oral)  Ht 5' 4.5" (1.638 m)  Wt 182 lb 12.8 oz (82.918 kg)  BMI 30.90 kg/m2  SpO2 100%  LMP 08/17/2014  Physical Exam  Physical Exam  Constitutional: She is oriented to person, place, and time and well-developed, well-nourished, and in no distress. No distress.  HENT:  Head: Normocephalic and atraumatic.  Eyes: Conjunctivae are normal.  Neck: Neck supple. No thyromegaly present.  Cardiovascular: Normal rate, regular rhythm and normal heart sounds.   No murmur heard. Pulmonary/Chest: Effort normal and breath sounds normal. She has no wheezes.  Abdominal: She exhibits no distension and no mass.  Musculoskeletal: She exhibits no edema.  Lymphadenopathy:    She has no cervical adenopathy.  Neurological: She is alert and oriented to person, place, and time.  Skin: Skin is warm and dry. No rash noted. She is not diaphoretic.  Psychiatric: Memory, affect and judgment normal.    Lab Results  Component Value Date   TSH 1.79 05/05/2013   Lab Results  Component Value Date   WBC 7.7 05/05/2013   HGB 11.5* 05/05/2013   HCT 33.9* 05/05/2013   MCV 87.7 05/05/2013   PLT 288.0 05/05/2013   Lab Results  Component Value Date   CREATININE 0.8 05/05/2013   BUN 16 05/05/2013   NA 137 05/05/2013   K 3.8 05/05/2013   CL 104 05/05/2013   CO2 26 05/05/2013   Lab Results  Component Value Date   ALT 19 05/05/2013   AST 21 05/05/2013   ALKPHOS 45 05/05/2013   BILITOT 0.5 05/05/2013   Lab Results  Component Value Date   CHOL 177 05/05/2013   Lab  Results  Component Value Date   HDL 73.90 05/05/2013   Lab Results  Component Value Date   LDLCALC 96 05/05/2013   Lab Results  Component Value Date   TRIG 38.0 05/05/2013   Lab Results  Component Value Date   CHOLHDL 2 05/05/2013     Assessment & Plan  Overweight Encouraged DASH diet, decrease po intake and increase exercise as tolerated. Needs 7-8 hours of sleep nightly. Avoid trans fats, eat small, frequent meals every 4-5 hours with lean proteins, complex carbs and healthy fats. Minimize simple carbs  Backache Encouraged moist heat and gentle stretching as tolerated. May try NSAIDs and prescription meds as directed and report if symptoms worsen or seek immediate care. Consider  chiropractic, PT, massage.   ADD (attention deficit disorder) Given refills on Vyvanse which has been helpful no concerns.

## 2014-08-29 LAB — URINE CYTOLOGY ANCILLARY ONLY
BACTERIAL VAGINITIS: NEGATIVE
CANDIDA VAGINITIS: NEGATIVE

## 2014-09-04 NOTE — Assessment & Plan Note (Signed)
Encouraged DASH diet, decrease po intake and increase exercise as tolerated. Needs 7-8 hours of sleep nightly. Avoid trans fats, eat small, frequent meals every 4-5 hours with lean proteins, complex carbs and healthy fats. Minimize simple carbs 

## 2014-09-04 NOTE — Assessment & Plan Note (Signed)
Given refills on Vyvanse which has been helpful no concerns.

## 2014-09-04 NOTE — Assessment & Plan Note (Signed)
Encouraged moist heat and gentle stretching as tolerated. May try NSAIDs and prescription meds as directed and report if symptoms worsen or seek immediate care. Consider chiropractic, PT, massage.

## 2014-11-15 ENCOUNTER — Telehealth: Payer: Self-pay | Admitting: *Deleted

## 2014-11-15 NOTE — Telephone Encounter (Signed)
Prior authorization initiated for Azelex cream. Awaiting determination. [id number]

## 2014-11-16 NOTE — Telephone Encounter (Signed)
Approved effective 11/15/2014 11/14/2015

## 2014-11-25 ENCOUNTER — Ambulatory Visit (INDEPENDENT_AMBULATORY_CARE_PROVIDER_SITE_OTHER): Payer: BLUE CROSS/BLUE SHIELD | Admitting: Family Medicine

## 2014-11-25 ENCOUNTER — Encounter: Payer: Self-pay | Admitting: Family Medicine

## 2014-11-25 VITALS — BP 112/78 | HR 73 | Temp 97.5°F | Ht 65.0 in | Wt 184.1 lb

## 2014-11-25 DIAGNOSIS — F329 Major depressive disorder, single episode, unspecified: Secondary | ICD-10-CM | POA: Diagnosis not present

## 2014-11-25 DIAGNOSIS — K589 Irritable bowel syndrome without diarrhea: Secondary | ICD-10-CM | POA: Diagnosis not present

## 2014-11-25 DIAGNOSIS — E663 Overweight: Secondary | ICD-10-CM | POA: Diagnosis not present

## 2014-11-25 DIAGNOSIS — F909 Attention-deficit hyperactivity disorder, unspecified type: Secondary | ICD-10-CM | POA: Diagnosis not present

## 2014-11-25 DIAGNOSIS — L708 Other acne: Secondary | ICD-10-CM

## 2014-11-25 DIAGNOSIS — F988 Other specified behavioral and emotional disorders with onset usually occurring in childhood and adolescence: Secondary | ICD-10-CM

## 2014-11-25 DIAGNOSIS — N946 Dysmenorrhea, unspecified: Secondary | ICD-10-CM

## 2014-11-25 DIAGNOSIS — F32A Depression, unspecified: Secondary | ICD-10-CM

## 2014-11-25 MED ORDER — LISDEXAMFETAMINE DIMESYLATE 50 MG PO CAPS: 50.0000 mg | ORAL_CAPSULE | Freq: Every day | ORAL | Status: DC

## 2014-11-25 MED ORDER — LISDEXAMFETAMINE DIMESYLATE 50 MG PO CAPS: 50.0000 mg | ORAL_CAPSULE | ORAL | Status: DC

## 2014-11-25 MED ORDER — CITALOPRAM HYDROBROMIDE 20 MG PO TABS: 20.0000 mg | ORAL_TABLET | Freq: Every day | ORAL | Status: DC

## 2014-11-25 NOTE — Assessment & Plan Note (Signed)
Encouraged DASH diet, decrease po intake and increase exercise as tolerated. Needs 7-8 hours of sleep nightly. Avoid trans fats, eat small, frequent meals every 4-5 hours with lean proteins, complex carbs and healthy fats. Minimize simple carbs, GMO foods. 

## 2014-11-25 NOTE — Progress Notes (Signed)
Pre visit review using our clinic review tool, if applicable. No additional management support is needed unless otherwise documented below in the visit note. 

## 2014-11-25 NOTE — Patient Instructions (Signed)
[facility]/OCP  Metformin

## 2014-11-25 NOTE — Progress Notes (Signed)
Bonnie Lewis  [id number] 1986-06-02 11/25/2014      Progress Note-Follow Up  Subjective  Chief Complaint  Chief Complaint  Patient presents with  . Follow-up    HPI  Patient is a 29 y.o. female in today for routine medical care. Patient in today for follow-up. Still responding well to Vyvanse slightly lower dose. Denies any concerning side effects such as anorexia or nausea, headaches, palpitations etc. Notes citalopram has been very helpful for her anxiety and depression and she feels it is keeping her calm. Does have mild insomnia but it is improved. Has started a probiotic she believes it is helping her stools. Denies CP/palp/SOB/HA/congestion/fevers/GI or GU c/o. Taking meds as prescribed  Past Medical History  Diagnosis Date  . ADD (attention deficit disorder)   . Bronchitis 08/11/2011  . LLQ pain 08/11/2011  . Anemia 09/12/2011  . Insomnia 09/12/2011  . Preventative health care 09/16/2011  . Seasonal affective disorder 10/19/2012  . Cervical cancer 10/19/2012  . Cervical cancer screening 10/19/2012  . Vaginitis 10/19/2012  . Dysmenorrhea 10/20/2012  . Dyslipidemia 10/20/2012  . Migraine 05/14/2013    History reviewed. No pertinent past surgical history.  Family History  Problem Relation Age of Onset  . Asthma Father   . Depression Father     and anxiety  . Allergies Father   . Irritable bowel syndrome Father   . Dementia Father   . Depression Sister   . Irritable bowel syndrome Sister   . Migraines Sister   . Depression Brother   . Allergies Brother   . Depression Maternal Grandmother     Anxiety  . Hypertension Maternal Grandmother   . ADD / ADHD Maternal Grandmother   . Menstrual problems Maternal Grandmother     heavy bleeding requiring hysterectomy  . Depression Paternal Grandmother   . Cardiomyopathy Paternal Grandmother   . Menstrual problems Paternal Grandmother   . Hyperlipidemia Mother   . Anemia Sister   . Coronary artery disease Paternal Grandfather     . Cancer Paternal Aunt     History   Social History  . Marital Status: Single    Spouse Name: N/A  . Number of Children: N/A  . Years of Education: N/A   Occupational History  . Not on file.   Social History Main Topics  . Smoking status: Never Smoker   . Smokeless tobacco: Never Used  . Alcohol Use: No  . Drug Use: No  . Sexual Activity: No   Other Topics Concern  . Not on file   Social History Narrative    Current Outpatient Prescriptions on File Prior to Visit  Medication Sig Dispense Refill  . azelaic acid (AZELEX) 20 % cream Apply topically 2 (two) times daily. After skin is thoroughly washed and patted dry, gently but thoroughly massage a thin film of azelaic acid cream into the affected area twice daily, in the morning and evening. 50 g 2  . citalopram (CELEXA) 20 MG tablet Take 1 tablet (20 mg total) by mouth daily. 30 tablet 2  . fluconazole (DIFLUCAN) 150 MG tablet 1 tab po weekly x 2 weeks 2 tablet 1  . lisdexamfetamine (VYVANSE) 50 MG capsule Take 1 capsule (50 mg total) by mouth every morning. RX December 2015 30 capsule 0  . lisdexamfetamine (VYVANSE) 50 MG capsule Take 1 capsule (50 mg total) by mouth daily. Jan 2016 RX 30 capsule 0  . lisdexamfetamine (VYVANSE) 50 MG capsule Take 1 capsule (50 mg total) by mouth daily.  Feb 2016 RX 30 capsule 0  . methocarbamol (ROBAXIN) 500 MG tablet Take 1 tablet (500 mg total) by mouth 3 (three) times daily. 60 tablet 0  . Multiple Vitamin (MULTIVITAMIN) tablet Take 1 tablet by mouth daily. Rainbow lite    . rizatriptan (MAXALT) 10 MG tablet TAKE 1 TABLET (10 MG TOTAL) BY MOUTH AS NEEDED FOR MIGRAINE. MAY REPEAT IN 2 HOURS IF NEEDED 10 tablet 0  . Sulfacetamide Sodium-Sulfur (AVAR CLEANSER) 10-5 % EMUL Apply to skin twice daily as directed 227 g 1  . traMADol (ULTRAM) 50 MG tablet Take 0.5 tablets (25 mg total) by mouth every 8 (eight) hours as needed. 30 tablet 0   No current facility-administered medications on file  prior to visit.    Allergies  Allergen Reactions  . Sulfonamide Derivatives     Review of Systems  Review of Systems  Constitutional: Negative for fever and malaise/fatigue.  HENT: Negative for congestion.   Eyes: Negative for discharge.  Respiratory: Negative for shortness of breath.   Cardiovascular: Negative for chest pain, palpitations and leg swelling.  Gastrointestinal: Negative for nausea, abdominal pain and diarrhea.  Genitourinary: Negative for dysuria.  Musculoskeletal: Negative for falls.  Skin: Negative for rash.  Neurological: Negative for loss of consciousness and headaches.  Endo/Heme/Allergies: Negative for polydipsia.  Psychiatric/Behavioral: Negative for depression and suicidal ideas. The patient is not nervous/anxious and does not have insomnia.     Objective  BP 112/78 mmHg  Pulse 73  Temp(Src) 97.5 F (36.4 C) (Oral)  Ht 5\' 5"  (1.651 m)  Wt 184 lb 2 oz (83.519 kg)  BMI 30.64 kg/m2  SpO2 95%  Physical Exam  Physical Exam  Constitutional: She is oriented to person, place, and time and well-developed, well-nourished, and in no distress. No distress.  HENT:  Head: Normocephalic and atraumatic.  Eyes: Conjunctivae are normal.  Neck: Neck supple. No thyromegaly present.  Cardiovascular: Normal rate, regular rhythm and normal heart sounds.   No murmur heard. Pulmonary/Chest: Effort normal and breath sounds normal. She has no wheezes.  Abdominal: She exhibits no distension and no mass.  Musculoskeletal: She exhibits no edema.  Lymphadenopathy:    She has no cervical adenopathy.  Neurological: She is alert and oriented to person, place, and time.  Skin: Skin is warm and dry. No rash noted. She is not diaphoretic.  Psychiatric: Memory, affect and judgment normal.    Lab Results  Component Value Date   TSH 1.79 05/05/2013   Lab Results  Component Value Date   WBC 7.7 05/05/2013   HGB 11.5* 05/05/2013   HCT 33.9* 05/05/2013   MCV 87.7  05/05/2013   PLT 288.0 05/05/2013   Lab Results  Component Value Date   CREATININE 0.8 05/05/2013   BUN 16 05/05/2013   NA 137 05/05/2013   K 3.8 05/05/2013   CL 104 05/05/2013   CO2 26 05/05/2013   Lab Results  Component Value Date   ALT 19 05/05/2013   AST 21 05/05/2013   ALKPHOS 45 05/05/2013   BILITOT 0.5 05/05/2013   Lab Results  Component Value Date   CHOL 177 05/05/2013   Lab Results  Component Value Date   HDL 73.90 05/05/2013   Lab Results  Component Value Date   LDLCALC 96 05/05/2013   Lab Results  Component Value Date   TRIG 38.0 05/05/2013   Lab Results  Component Value Date   CHOLHDL 2 05/05/2013     Assessment & Plan   Overweight Encouraged  DASH diet, decrease po intake and increase exercise as tolerated. Needs 7-8 hours of sleep nightly. Avoid trans fats, eat small, frequent meals every 4-5 hours with lean proteins, complex carbs and healthy fats. Minimize simple carbs, GMO foods.   ADD (attention deficit disorder) Given refill on Vyvanse which has been working well   IBS Avoid offending foods, start probiotics. Do not eat large meals in late evening and consider raising head of bed.    Dysmenorrhea With irregular menses. Offered referral to GYN declines for now.    ACNE VULGARIS Continues to struggle, OCPs may be helpful

## 2014-12-11 ENCOUNTER — Encounter: Payer: Self-pay | Admitting: Family Medicine

## 2014-12-11 NOTE — Assessment & Plan Note (Signed)
With irregular menses. Offered referral to GYN declines for now.

## 2014-12-11 NOTE — Assessment & Plan Note (Signed)
Given refill on Vyvanse which has been working well

## 2014-12-11 NOTE — Assessment & Plan Note (Signed)
Avoid offending foods, start probiotics. Do not eat large meals in late evening and consider raising head of bed.  

## 2014-12-11 NOTE — Assessment & Plan Note (Signed)
Continues to struggle, OCPs may be helpful

## 2015-01-18 IMAGING — CR DG FOOT COMPLETE 3+V*R*
3 series · 3 of 3 positions shown · non-contrast
Comparison: None.

CLINICAL DATA: 26-year-old female status post blunt trauma with
pain and swelling and bruising.

RIGHT FOOT COMPLETE - 3+ VIEW

[t foot ap right]
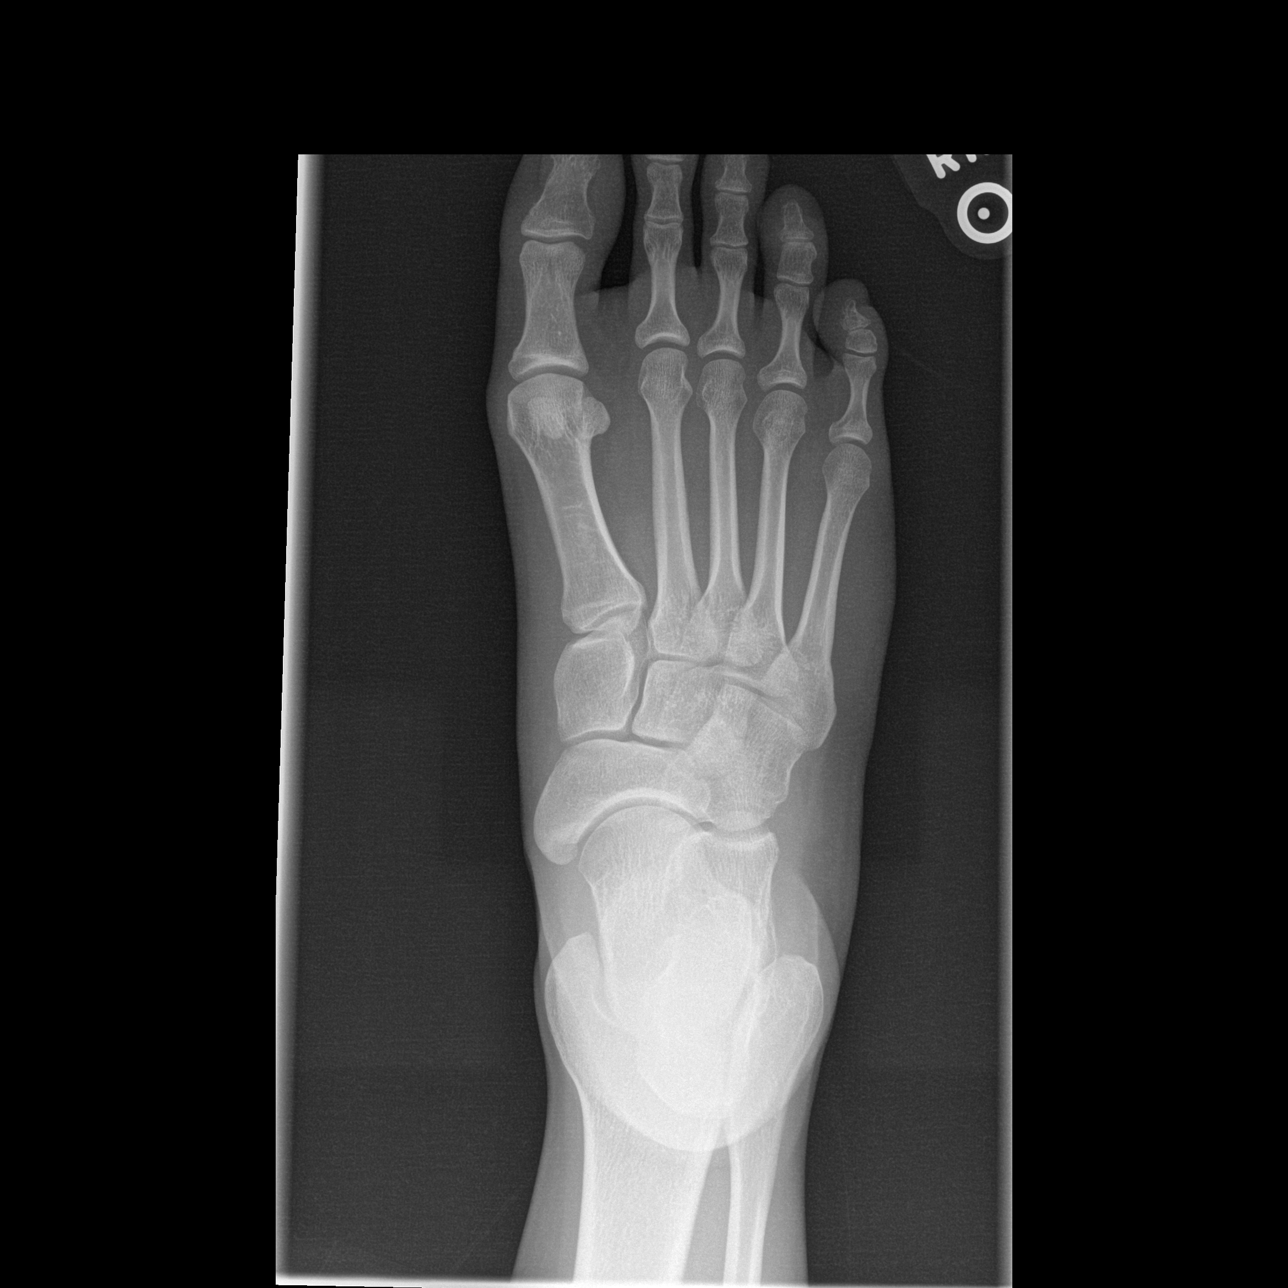

[t foot oblique right]
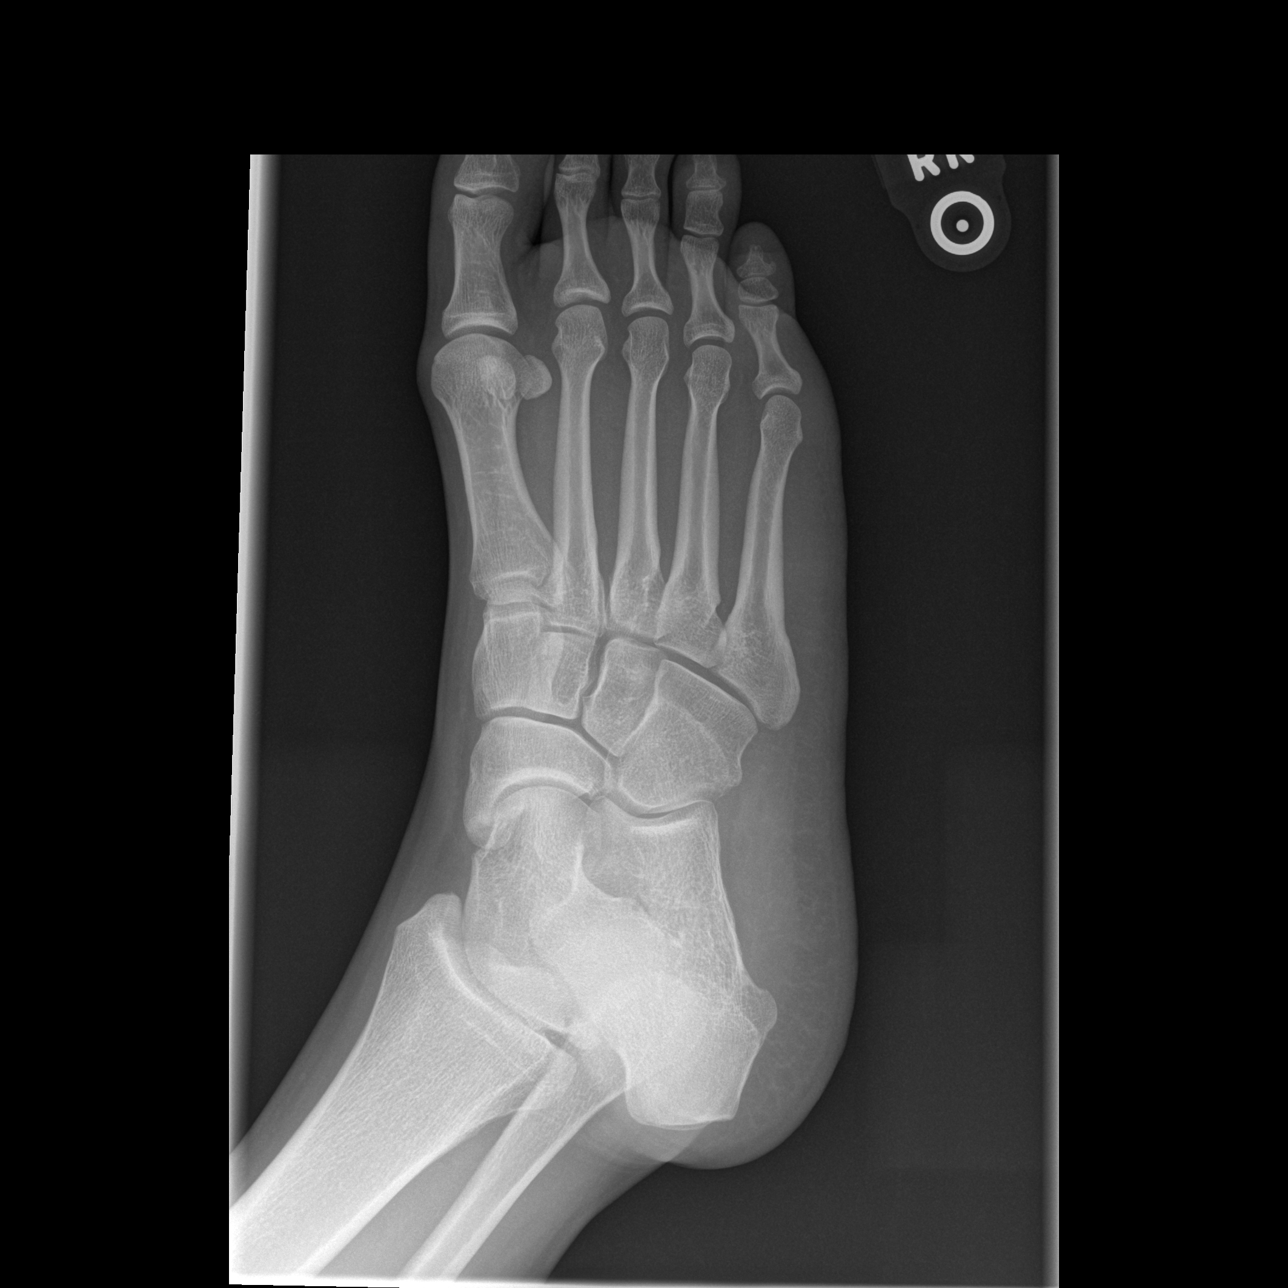

[t foot lat right]
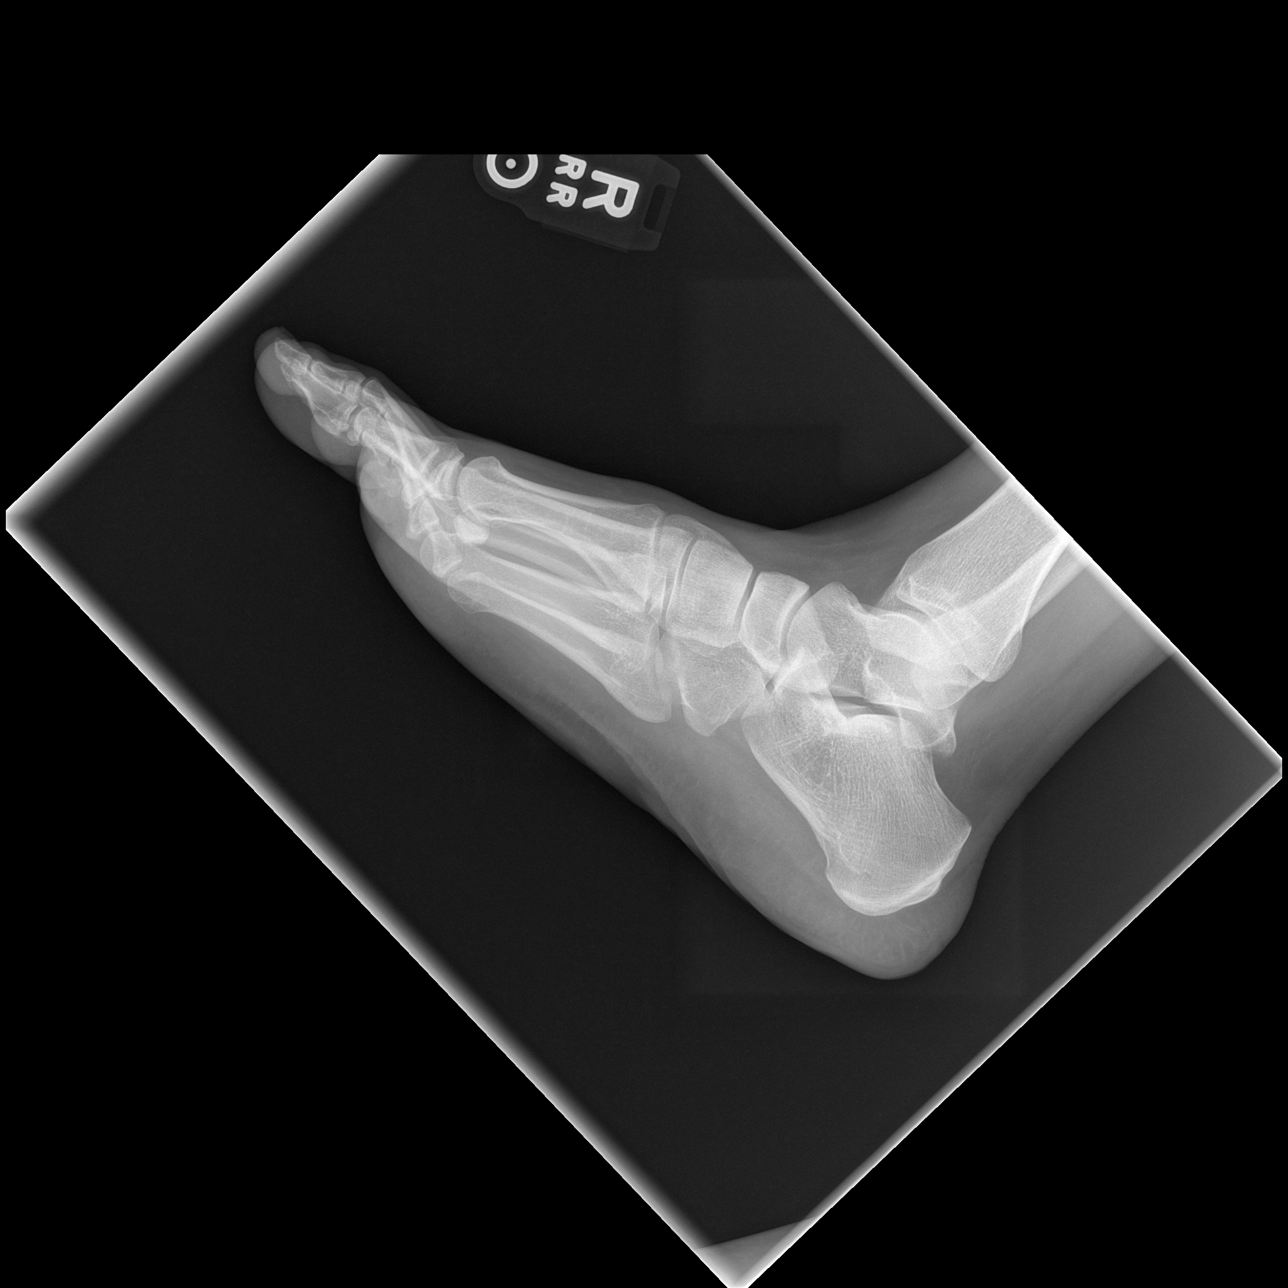

[3 of 3 positions shown; findings below may reference images not displayed]

FINDINGS: Bone mineralization is within normal limits.  Calcaneus
intact.  Joint spaces preserved.  Alignment within normal limits.
No acute fracture identified.
IMPRESSION: No acute fracture or dislocation identified about the right foot.

## 2015-02-21 ENCOUNTER — Encounter: Payer: BC Managed Care – PPO | Admitting: Family Medicine

## 2015-03-16 ENCOUNTER — Telehealth: Payer: Self-pay | Admitting: Family Medicine

## 2015-03-16 ENCOUNTER — Other Ambulatory Visit: Payer: Self-pay | Admitting: Family Medicine

## 2015-03-16 DIAGNOSIS — F988 Other specified behavioral and emotional disorders with onset usually occurring in childhood and adolescence: Secondary | ICD-10-CM

## 2015-03-16 MED ORDER — LISDEXAMFETAMINE DIMESYLATE 50 MG PO CAPS: 50.0000 mg | ORAL_CAPSULE | Freq: Every day | ORAL | Status: DC

## 2015-03-16 MED ORDER — LISDEXAMFETAMINE DIMESYLATE 50 MG PO CAPS: 50.0000 mg | ORAL_CAPSULE | ORAL | Status: DC

## 2015-03-16 NOTE — Telephone Encounter (Signed)
Caller name: Sheryle Vice Relationship to patient: self Can be reached: [phone] Pharmacy: Rx to be picked up  Reason for call: Pt called for refill on lisdexamfetamine (VYVANSE) 50 MG capsule. She asked for 3 prescriptions at one time like last time. She states that her father has appt today at 3:00pm. Can we print for them to take the Rx with them?

## 2015-03-16 NOTE — Progress Notes (Signed)
Refills given for 3 month supply of Vyvanse at patient request

## 2015-03-16 NOTE — Telephone Encounter (Signed)
Done prescription given to mother in office today

## 2015-03-24 ENCOUNTER — Telehealth: Payer: Self-pay | Admitting: Family Medicine

## 2015-03-24 NOTE — Telephone Encounter (Signed)
Pre Visit letter sent  °

## 2015-04-13 ENCOUNTER — Telehealth: Payer: Self-pay | Admitting: Behavioral Health

## 2015-04-13 ENCOUNTER — Encounter: Payer: Self-pay | Admitting: Behavioral Health

## 2015-04-13 NOTE — Telephone Encounter (Signed)
Pre-Visit Call completed with patient and chart updated.   Pre-Visit Info documented in Specialty Comments under [employer].    

## 2015-04-14 ENCOUNTER — Ambulatory Visit (INDEPENDENT_AMBULATORY_CARE_PROVIDER_SITE_OTHER): Payer: BLUE CROSS/BLUE SHIELD | Admitting: Family Medicine

## 2015-04-14 ENCOUNTER — Encounter: Payer: Self-pay | Admitting: Family Medicine

## 2015-04-14 VITALS — BP 110/70 | HR 78 | Temp 98.5°F | Ht 64.5 in | Wt 193.0 lb

## 2015-04-14 DIAGNOSIS — F909 Attention-deficit hyperactivity disorder, unspecified type: Secondary | ICD-10-CM

## 2015-04-14 DIAGNOSIS — F988 Other specified behavioral and emotional disorders with onset usually occurring in childhood and adolescence: Secondary | ICD-10-CM

## 2015-04-14 DIAGNOSIS — L700 Acne vulgaris: Secondary | ICD-10-CM

## 2015-04-14 DIAGNOSIS — E782 Mixed hyperlipidemia: Secondary | ICD-10-CM

## 2015-04-14 DIAGNOSIS — F338 Other recurrent depressive disorders: Secondary | ICD-10-CM

## 2015-04-14 DIAGNOSIS — G43809 Other migraine, not intractable, without status migrainosus: Secondary | ICD-10-CM

## 2015-04-14 DIAGNOSIS — L709 Acne, unspecified: Secondary | ICD-10-CM | POA: Insufficient documentation

## 2015-04-14 DIAGNOSIS — L708 Other acne: Secondary | ICD-10-CM

## 2015-04-14 DIAGNOSIS — Z0000 Encounter for general adult medical examination without abnormal findings: Secondary | ICD-10-CM | POA: Diagnosis not present

## 2015-04-14 DIAGNOSIS — F39 Unspecified mood [affective] disorder: Secondary | ICD-10-CM

## 2015-04-14 MED ORDER — LISDEXAMFETAMINE DIMESYLATE 60 MG PO CAPS: 60.0000 mg | ORAL_CAPSULE | ORAL | Status: DC

## 2015-04-14 MED ORDER — DESOGESTREL-ETHINYL ESTRADIOL 0.15-0.02/0.01 MG (21/5) PO TABS: 1.0000 | ORAL_TABLET | Freq: Every day | ORAL | Status: DC

## 2015-04-14 NOTE — Progress Notes (Signed)
Bonnie Lewis  [id number] [birth year] 04/14/2015      Progress Note-Follow Up  Subjective  Chief Complaint  Chief Complaint  Patient presents with  . Annual Exam    HPI  Patient is a 29 y.o. female in today for routine medical care. Patient is in today for annual exam. Overall doing well. No recent illness. She does report Vyvanse is helpful although she does not use it routinely. She brings in old prescriptions to shred since they were never utilized but is  Denies CP/palp/SOB/HA/congestion/fevers/GI or GU c/o. Taking meds as prescribedrequesting a refill. She's had no recent illness. Her job is going somewhat better and she does note her mood is improved since using citalopram routinely.   Past Medical History  Diagnosis Date  . ADD (attention deficit disorder)   . Bronchitis 08/11/2011  . LLQ pain 08/11/2011  . Anemia 09/12/2011  . Insomnia 09/12/2011  . Preventative health care 09/16/2011  . Seasonal affective disorder 10/19/2012  . Cervical cancer 10/19/2012  . Cervical cancer screening 10/19/2012  . Vaginitis 10/19/2012  . Dysmenorrhea 10/20/2012  . Dyslipidemia 10/20/2012  . Migraine 05/14/2013    History reviewed. No pertinent past surgical history.  Family History  Problem Relation Age of Onset  . Asthma Father   . Depression Father     and anxiety  . Allergies Father   . Irritable bowel syndrome Father   . Dementia Father   . Depression Sister   . Irritable bowel syndrome Sister   . Migraines Sister   . Depression Brother   . Allergies Brother   . Depression Maternal Grandmother     Anxiety  . Hypertension Maternal Grandmother   . ADD / ADHD Maternal Grandmother   . Menstrual problems Maternal Grandmother     heavy bleeding requiring hysterectomy  . Depression Paternal Grandmother   . Cardiomyopathy Paternal Grandmother   . Menstrual problems Paternal Grandmother   . Hyperlipidemia Mother   . Anemia Sister   . Coronary artery disease Paternal Grandfather   .  Cancer Paternal Aunt     History   Social History  . Marital Status: Single    Spouse Name: N/A  . Number of Children: N/A  . Years of Education: N/A   Occupational History  . Not on file.   Social History Main Topics  . Smoking status: Never Smoker   . Smokeless tobacco: Never Used  . Alcohol Use: No  . Drug Use: No  . Sexual Activity: No   Other Topics Concern  . Not on file   Social History Narrative    Current Outpatient Prescriptions on File Prior to Visit  Medication Sig Dispense Refill  . citalopram (CELEXA) 20 MG tablet Take 1 tablet (20 mg total) by mouth daily. 30 tablet 5  . rizatriptan (MAXALT) 10 MG tablet TAKE 1 TABLET (10 MG TOTAL) BY MOUTH AS NEEDED FOR MIGRAINE. MAY REPEAT IN 2 HOURS IF NEEDED (Patient not taking: Reported on 04/14/2015) 10 tablet 0   No current facility-administered medications on file prior to visit.    Allergies  Allergen Reactions  . Sulfonamide Derivatives     Review of Systems  Review of Systems  Constitutional: Negative for fever and malaise/fatigue.  HENT: Negative for congestion.   Eyes: Negative for discharge.  Respiratory: Negative for shortness of breath.   Cardiovascular: Negative for chest pain, palpitations and leg swelling.  Gastrointestinal: Negative for nausea, abdominal pain and diarrhea.  Genitourinary: Negative for dysuria.  Musculoskeletal: Negative for  falls.  Skin: Negative for rash.  Neurological: Negative for loss of consciousness and headaches.  Endo/Heme/Allergies: Negative for polydipsia.  Psychiatric/Behavioral: Negative for depression and suicidal ideas. The patient is not nervous/anxious and does not have insomnia.     Objective  BP 110/70 mmHg  Pulse 78  Temp(Src) 98.5 F (36.9 C) (Oral)  Ht 5' 4.5" (1.638 m)  Wt 193 lb (87.544 kg)  BMI 32.63 kg/m2  SpO2 97%  LMP 04/03/2015  Physical Exam  Physical Exam  Constitutional: She is oriented to person, place, and time and well-developed,  well-nourished, and in no distress. No distress.  HENT:  Head: Normocephalic and atraumatic.  Right Ear: External ear normal.  Left Ear: External ear normal.  Nose: Nose normal.  Mouth/Throat: Oropharynx is clear and moist. No oropharyngeal exudate.  Eyes: Conjunctivae are normal. Pupils are equal, round, and reactive to light. Right eye exhibits no discharge. Left eye exhibits no discharge. No scleral icterus.  Neck: Normal range of motion. Neck supple. No thyromegaly present.  Cardiovascular: Normal rate, regular rhythm, normal heart sounds and intact distal pulses.   No murmur heard. Pulmonary/Chest: Effort normal and breath sounds normal. No respiratory distress. She has no wheezes. She has no rales.  Abdominal: Soft. Bowel sounds are normal. She exhibits no distension and no mass. There is no tenderness.  Musculoskeletal: Normal range of motion. She exhibits no edema or tenderness.  Lymphadenopathy:    She has no cervical adenopathy.  Neurological: She is alert and oriented to person, place, and time. She has normal reflexes. No cranial nerve deficit. Coordination normal.  Skin: Skin is warm and dry. No rash noted. She is not diaphoretic.  Psychiatric: Mood, memory and affect normal.    Lab Results  Component Value Date   TSH 1.79 05/05/2013   Lab Results  Component Value Date   WBC 7.7 05/05/2013   HGB 11.5* 05/05/2013   HCT 33.9* 05/05/2013   MCV 87.7 05/05/2013   PLT 288.0 05/05/2013   Lab Results  Component Value Date   CREATININE 0.8 05/05/2013   BUN 16 05/05/2013   NA 137 05/05/2013   K 3.8 05/05/2013   CL 104 05/05/2013   CO2 26 05/05/2013   Lab Results  Component Value Date   ALT 19 05/05/2013   AST 21 05/05/2013   ALKPHOS 45 05/05/2013   BILITOT 0.5 05/05/2013   Lab Results  Component Value Date   CHOL 177 05/05/2013   Lab Results  Component Value Date   HDL 73.90 05/05/2013   Lab Results  Component Value Date   LDLCALC 96 05/05/2013   Lab  Results  Component Value Date   TRIG 38.0 05/05/2013   Lab Results  Component Value Date   CHOLHDL 2 05/05/2013     Assessment & Plan  ADD (attention deficit disorder) Doing well on Vyvanse  Mixed hyperlipidemia Encouraged heart healthy diet, increase exercise, avoid trans fats, consider a krill oil cap daily  ACNE VULGARIS Good response to Kariva, no changes  Migraine No recent flares. Encouraged increased hydration, 64 ounces of clear fluids daily. Minimize alcohol and caffeine. Eat small frequent meals with lean proteins and complex carbs. Avoid high and low blood sugars. Get adequate sleep, 7-8 hours a night. Needs exercise daily preferably in the morning.  Preventative health care Patient encouraged to maintain heart healthy diet, regular exercise, adequate sleep. Consider daily probiotics. Take medications as prescribed  Seasonal affective disorder Good response to Citalopram continue the same

## 2015-04-14 NOTE — Patient Instructions (Addendum)
Cbc for anemia TSH weight gain CMP for preventative, back pain Lipid for hyperlipidemia, mixed  Denies CP/palp/SOB/HA/congestion/fevers/GI or GU c/o. Taking meds as prescribed   Preventive Care for Adults A healthy lifestyle and preventive care can promote health and wellness. Preventive health guidelines for women include the following key practices.  A routine yearly physical is a good way to check with your health care provider about your health and preventive screening. It is a chance to share any concerns and updates on your health and to receive a thorough exam.  Visit your dentist for a routine exam and preventive care every 6 months. Brush your teeth twice a day and floss once a day. Good oral hygiene prevents tooth decay and gum disease.  The frequency of eye exams is based on your age, health, family medical history, use of contact lenses, and other factors. Follow your health care provider's recommendations for frequency of eye exams.  Eat a healthy diet. Foods like vegetables, fruits, whole grains, low-fat dairy products, and lean protein foods contain the nutrients you need without too many calories. Decrease your intake of foods high in solid fats, added sugars, and salt. Eat the right amount of calories for you.Get information about a proper diet from your health care provider, if necessary.  Regular physical exercise is one of the most important things you can do for your health. Most adults should get at least 150 minutes of moderate-intensity exercise (any activity that increases your heart rate and causes you to sweat) each week. In addition, most adults need muscle-strengthening exercises on 2 or more days a week.  Maintain a healthy weight. The body mass index (BMI) is a screening tool to identify possible weight problems. It provides an estimate of body fat based on height and weight. Your health care provider can find your BMI and can help you achieve or maintain a  healthy weight.For adults 20 years and older:  A BMI below 18.5 is considered underweight.  A BMI of 18.5 to 24.9 is normal.  A BMI of 25 to 29.9 is considered overweight.  A BMI of 30 and above is considered obese.  Maintain normal blood lipids and cholesterol levels by exercising and minimizing your intake of saturated fat. Eat a balanced diet with plenty of fruit and vegetables. Blood tests for lipids and cholesterol should begin at age 43 and be repeated every 5 years. If your lipid or cholesterol levels are high, you are over 50, or you are at high risk for heart disease, you may need your cholesterol levels checked more frequently.Ongoing high lipid and cholesterol levels should be treated with medicines if diet and exercise are not working.  If you smoke, find out from your health care provider how to quit. If you do not use tobacco, do not start.  Lung cancer screening is recommended for adults aged 69-80 years who are at high risk for developing lung cancer because of a history of smoking. A yearly low-dose CT scan of the lungs is recommended for people who have at least a 30-pack-year history of smoking and are a current smoker or have quit within the past 15 years. A pack year of smoking is smoking an average of 1 pack of cigarettes a day for 1 year (for example: 1 pack a day for 30 years or 2 packs a day for 15 years). Yearly screening should continue until the smoker has stopped smoking for at least 15 years. Yearly screening should be stopped for people  who develop a health problem that would prevent them from having lung cancer treatment.  If you are pregnant, do not drink alcohol. If you are breastfeeding, be very cautious about drinking alcohol. If you are not pregnant and choose to drink alcohol, do not have more than 1 drink per day. One drink is considered to be 12 ounces (355 mL) of beer, 5 ounces (148 mL) of wine, or 1.5 ounces (44 mL) of liquor.  Avoid use of street drugs.  Do not share needles with anyone. Ask for help if you need support or instructions about stopping the use of drugs.  High blood pressure causes heart disease and increases the risk of stroke. Your blood pressure should be checked at least every 1 to 2 years. Ongoing high blood pressure should be treated with medicines if weight loss and exercise do not work.  If you are 48-87 years old, ask your health care provider if you should take aspirin to prevent strokes.  Diabetes screening involves taking a blood sample to check your fasting blood sugar level. This should be done once every 3 years, after age 65, if you are within normal weight and without risk factors for diabetes. Testing should be considered at a younger age or be carried out more frequently if you are overweight and have at least 1 risk factor for diabetes.  Breast cancer screening is essential preventive care for women. You should practice "breast self-awareness." This means understanding the normal appearance and feel of your breasts and may include breast self-examination. Any changes detected, no matter how small, should be reported to a health care provider. Women in their 32s and 30s should have a clinical breast exam (CBE) by a health care provider as part of a regular health exam every 1 to 3 years. After age 43, women should have a CBE every year. Starting at age 80, women should consider having a mammogram (breast X-ray test) every year. Women who have a family history of breast cancer should talk to their health care provider about genetic screening. Women at a high risk of breast cancer should talk to their health care providers about having an MRI and a mammogram every year.  Breast cancer gene (BRCA)-related cancer risk assessment is recommended for women who have family members with BRCA-related cancers. BRCA-related cancers include breast, ovarian, tubal, and peritoneal cancers. Having family members with these cancers may be  associated with an increased risk for harmful changes (mutations) in the breast cancer genes BRCA1 and BRCA2. Results of the assessment will determine the need for genetic counseling and BRCA1 and BRCA2 testing.  Routine pelvic exams to screen for cancer are no longer recommended for nonpregnant women who are considered low risk for cancer of the pelvic organs (ovaries, uterus, and vagina) and who do not have symptoms. Ask your health care provider if a screening pelvic exam is right for you.  If you have had past treatment for cervical cancer or a condition that could lead to cancer, you need Pap tests and screening for cancer for at least 20 years after your treatment. If Pap tests have been discontinued, your risk factors (such as having a new sexual partner) need to be reassessed to determine if screening should be resumed. Some women have medical problems that increase the chance of getting cervical cancer. In these cases, your health care provider may recommend more frequent screening and Pap tests.  The HPV test is an additional test that may be used for cervical  cancer screening. The HPV test looks for the virus that can cause the cell changes on the cervix. The cells collected during the Pap test can be tested for HPV. The HPV test could be used to screen women aged 23 years and older, and should be used in women of any age who have unclear Pap test results. After the age of 54, women should have HPV testing at the same frequency as a Pap test.  Colorectal cancer can be detected and often prevented. Most routine colorectal cancer screening begins at the age of 62 years and continues through age 38 years. However, your health care provider may recommend screening at an earlier age if you have risk factors for colon cancer. On a yearly basis, your health care provider may provide home test kits to check for hidden blood in the stool. Use of a small camera at the end of a tube, to directly examine the  colon (sigmoidoscopy or colonoscopy), can detect the earliest forms of colorectal cancer. Talk to your health care provider about this at age 42, when routine screening begins. Direct exam of the colon should be repeated every 5-10 years through age 81 years, unless early forms of pre-cancerous polyps or small growths are found.  People who are at an increased risk for hepatitis B should be screened for this virus. You are considered at high risk for hepatitis B if:  You were born in a country where hepatitis B occurs often. Talk with your health care provider about which countries are considered high risk.  Your parents were born in a high-risk country and you have not received a shot to protect against hepatitis B (hepatitis B vaccine).  You have HIV or AIDS.  You use needles to inject street drugs.  You live with, or have sex with, someone who has hepatitis B.  You get hemodialysis treatment.  You take certain medicines for conditions like cancer, organ transplantation, and autoimmune conditions.  Hepatitis C blood testing is recommended for all people born from [birth year] through 1965 and any individual with known risks for hepatitis C.  Practice safe sex. Use condoms and avoid high-risk sexual practices to reduce the spread of sexually transmitted infections (STIs). STIs include gonorrhea, chlamydia, syphilis, trichomonas, herpes, HPV, and human immunodeficiency virus (HIV). Herpes, HIV, and HPV are viral illnesses that have no cure. They can result in disability, cancer, and death.  You should be screened for sexually transmitted illnesses (STIs) including gonorrhea and chlamydia if:  You are sexually active and are younger than 24 years.  You are older than 24 years and your health care provider tells you that you are at risk for this type of infection.  Your sexual activity has changed since you were last screened and you are at an increased risk for chlamydia or gonorrhea. Ask your  health care provider if you are at risk.  If you are at risk of being infected with HIV, it is recommended that you take a prescription medicine daily to prevent HIV infection. This is called preexposure prophylaxis (PrEP). You are considered at risk if:  You are a heterosexual woman, are sexually active, and are at increased risk for HIV infection.  You take drugs by injection.  You are sexually active with a partner who has HIV.  Talk with your health care provider about whether you are at high risk of being infected with HIV. If you choose to begin PrEP, you should first be tested for HIV. You should  then be tested every 3 months for as long as you are taking PrEP.  Osteoporosis is a disease in which the bones lose minerals and strength with aging. This can result in serious bone fractures or breaks. The risk of osteoporosis can be identified using a bone density scan. Women ages 80 years and over and women at risk for fractures or osteoporosis should discuss screening with their health care providers. Ask your health care provider whether you should take a calcium supplement or vitamin D to reduce the rate of osteoporosis.  Menopause can be associated with physical symptoms and risks. Hormone replacement therapy is available to decrease symptoms and risks. You should talk to your health care provider about whether hormone replacement therapy is right for you.  Use sunscreen. Apply sunscreen liberally and repeatedly throughout the day. You should seek shade when your shadow is shorter than you. Protect yourself by wearing long sleeves, pants, a wide-brimmed hat, and sunglasses year round, whenever you are outdoors.  Once a month, do a whole body skin exam, using a mirror to look at the skin on your back. Tell your health care provider of new moles, moles that have irregular borders, moles that are larger than a pencil eraser, or moles that have changed in shape or color.  Stay current with  required vaccines (immunizations).  Influenza vaccine. All adults should be immunized every year.  Tetanus, diphtheria, and acellular pertussis (Td, Tdap) vaccine. Pregnant women should receive 1 dose of Tdap vaccine during each pregnancy. The dose should be obtained regardless of the length of time since the last dose. Immunization is preferred during the 27th-36th week of gestation. An adult who has not previously received Tdap or who does not know her vaccine status should receive 1 dose of Tdap. This initial dose should be followed by tetanus and diphtheria toxoids (Td) booster doses every 10 years. Adults with an unknown or incomplete history of completing a 3-dose immunization series with Td-containing vaccines should begin or complete a primary immunization series including a Tdap dose. Adults should receive a Td booster every 10 years.  Varicella vaccine. An adult without evidence of immunity to varicella should receive 2 doses or a second dose if she has previously received 1 dose. Pregnant females who do not have evidence of immunity should receive the first dose after pregnancy. This first dose should be obtained before leaving the health care facility. The second dose should be obtained 4-8 weeks after the first dose.  Human papillomavirus (HPV) vaccine. Females aged 13-26 years who have not received the vaccine previously should obtain the 3-dose series. The vaccine is not recommended for use in pregnant females. However, pregnancy testing is not needed before receiving a dose. If a female is found to be pregnant after receiving a dose, no treatment is needed. In that case, the remaining doses should be delayed until after the pregnancy. Immunization is recommended for any person with an immunocompromised condition through the age of 26 years if she did not get any or all doses earlier. During the 3-dose series, the second dose should be obtained 4-8 weeks after the first dose. The third dose  should be obtained 24 weeks after the first dose and 16 weeks after the second dose.  Zoster vaccine. One dose is recommended for adults aged 66 years or older unless certain conditions are present.  Measles, mumps, and rubella (MMR) vaccine. Adults born before [birth year] generally are considered immune to measles and mumps. Adults born in [birth year]  or later should have 1 or more doses of MMR vaccine unless there is a contraindication to the vaccine or there is laboratory evidence of immunity to each of the three diseases. A routine second dose of MMR vaccine should be obtained at least 28 days after the first dose for students attending postsecondary schools, health care workers, or international travelers. People who received inactivated measles vaccine or an unknown type of measles vaccine during 1963-1967 should receive 2 doses of MMR vaccine. People who received inactivated mumps vaccine or an unknown type of mumps vaccine before 1979 and are at high risk for mumps infection should consider immunization with 2 doses of MMR vaccine. For females of childbearing age, rubella immunity should be determined. If there is no evidence of immunity, females who are not pregnant should be vaccinated. If there is no evidence of immunity, females who are pregnant should delay immunization until after pregnancy. Unvaccinated health care workers born before [birth year] who lack laboratory evidence of measles, mumps, or rubella immunity or laboratory confirmation of disease should consider measles and mumps immunization with 2 doses of MMR vaccine or rubella immunization with 1 dose of MMR vaccine.  Pneumococcal 13-valent conjugate (PCV13) vaccine. When indicated, a person who is uncertain of her immunization history and has no record of immunization should receive the PCV13 vaccine. An adult aged 86 years or older who has certain medical conditions and has not been previously immunized should receive 1 dose of PCV13 vaccine. This PCV13  should be followed with a dose of pneumococcal polysaccharide (PPSV23) vaccine. The PPSV23 vaccine dose should be obtained at least 8 weeks after the dose of PCV13 vaccine. An adult aged 67 years or older who has certain medical conditions and previously received 1 or more doses of PPSV23 vaccine should receive 1 dose of PCV13. The PCV13 vaccine dose should be obtained 1 or more years after the last PPSV23 vaccine dose.  Pneumococcal polysaccharide (PPSV23) vaccine. When PCV13 is also indicated, PCV13 should be obtained first. All adults aged 21 years and older should be immunized. An adult younger than age 2 years who has certain medical conditions should be immunized. Any person who resides in a nursing home or long-term care facility should be immunized. An adult smoker should be immunized. People with an immunocompromised condition and certain other conditions should receive both PCV13 and PPSV23 vaccines. People with human immunodeficiency virus (HIV) infection should be immunized as soon as possible after diagnosis. Immunization during chemotherapy or radiation therapy should be avoided. Routine use of PPSV23 vaccine is not recommended for American Indians, [place] Natives, or people younger than 65 years unless there are medical conditions that require PPSV23 vaccine. When indicated, people who have unknown immunization and have no record of immunization should receive PPSV23 vaccine. One-time revaccination 5 years after the first dose of PPSV23 is recommended for people aged 19-64 years who have chronic kidney failure, nephrotic syndrome, asplenia, or immunocompromised conditions. People who received 1-2 doses of PPSV23 before age 57 years should receive another dose of PPSV23 vaccine at age 66 years or later if at least 5 years have passed since the previous dose. Doses of PPSV23 are not needed for people immunized with PPSV23 at or after age 70 years.  Meningococcal vaccine. Adults with asplenia or  persistent complement component deficiencies should receive 2 doses of quadrivalent meningococcal conjugate (MenACWY-D) vaccine. The doses should be obtained at least 2 months apart. Microbiologists working with certain meningococcal bacteria, [employer] recruits, people at risk during  an outbreak, and people who travel to or live in countries with a high rate of meningitis should be immunized. A first-year college student up through age 62 years who is living in a residence hall should receive a dose if she did not receive a dose on or after her 16th birthday. Adults who have certain high-risk conditions should receive one or more doses of vaccine.  Hepatitis A vaccine. Adults who wish to be protected from this disease, have certain high-risk conditions, work with hepatitis A-infected animals, work in hepatitis A research labs, or travel to or work in countries with a high rate of hepatitis A should be immunized. Adults who were previously unvaccinated and who anticipate close contact with an international adoptee during the first 60 days after arrival in the [place] from a country with a high rate of hepatitis A should be immunized.  Hepatitis B vaccine. Adults who wish to be protected from this disease, have certain high-risk conditions, may be exposed to blood or other infectious body fluids, are household contacts or sex partners of hepatitis B positive people, are clients or workers in certain care facilities, or travel to or work in countries with a high rate of hepatitis B should be immunized.  Haemophilus influenzae type b (Hib) vaccine. A previously unvaccinated person with asplenia or sickle cell disease or having a scheduled splenectomy should receive 1 dose of Hib vaccine. Regardless of previous immunization, a recipient of a hematopoietic stem cell transplant should receive a 3-dose series 6-12 months after her successful transplant. Hib vaccine is not recommended for adults with HIV  infection. Preventive Services / Frequency Ages 83 to 57 years  Blood pressure check.** / Every 1 to 2 years.  Lipid and cholesterol check.** / Every 5 years beginning at age 40.  Clinical breast exam.** / Every 3 years for women in their 66s and 76s.  BRCA-related cancer risk assessment.** / For women who have family members with a BRCA-related cancer (breast, ovarian, tubal, or peritoneal cancers).  Pap test.** / Every 2 years from ages 57 through 44. Every 3 years starting at age 40 through age 70 or 65 with a history of 3 consecutive normal Pap tests.  HPV screening.** / Every 3 years from ages 39 through ages 23 to 22 with a history of 3 consecutive normal Pap tests.  Hepatitis C blood test.** / For any individual with known risks for hepatitis C.  Skin self-exam. / Monthly.  Influenza vaccine. / Every year.  Tetanus, diphtheria, and acellular pertussis (Tdap, Td) vaccine.** / Consult your health care provider. Pregnant women should receive 1 dose of Tdap vaccine during each pregnancy. 1 dose of Td every 10 years.  Varicella vaccine.** / Consult your health care provider. Pregnant females who do not have evidence of immunity should receive the first dose after pregnancy.  HPV vaccine. / 3 doses over 6 months, if 53 and younger. The vaccine is not recommended for use in pregnant females. However, pregnancy testing is not needed before receiving a dose.  Measles, mumps, rubella (MMR) vaccine.** / You need at least 1 dose of MMR if you were born in 1957 or later. You may also need a 2nd dose. For females of childbearing age, rubella immunity should be determined. If there is no evidence of immunity, females who are not pregnant should be vaccinated. If there is no evidence of immunity, females who are pregnant should delay immunization until after pregnancy.  Pneumococcal 13-valent conjugate (PCV13) vaccine.** / Consult  your health care provider.  Pneumococcal polysaccharide  (PPSV23) vaccine.** / 1 to 2 doses if you smoke cigarettes or if you have certain conditions.  Meningococcal vaccine.** / 1 dose if you are age 78 to 81 years and a Market researcher living in a residence hall, or have one of several medical conditions, you need to get vaccinated against meningococcal disease. You may also need additional booster doses.  Hepatitis A vaccine.** / Consult your health care provider.  Hepatitis B vaccine.** / Consult your health care provider.  Haemophilus influenzae type b (Hib) vaccine.** / Consult your health care provider. Ages 6 to 6 years  Blood pressure check.** / Every 1 to 2 years.  Lipid and cholesterol check.** / Every 5 years beginning at age 69 years.  Lung cancer screening. / Every year if you are aged 74-80 years and have a 30-pack-year history of smoking and currently smoke or have quit within the past 15 years. Yearly screening is stopped once you have quit smoking for at least 15 years or develop a health problem that would prevent you from having lung cancer treatment.  Clinical breast exam.** / Every year after age 52 years.  BRCA-related cancer risk assessment.** / For women who have family members with a BRCA-related cancer (breast, ovarian, tubal, or peritoneal cancers).  Mammogram.** / Every year beginning at age 57 years and continuing for as long as you are in good health. Consult with your health care provider.  Pap test.** / Every 3 years starting at age 28 years through age 37 or 80 years with a history of 3 consecutive normal Pap tests.  HPV screening.** / Every 3 years from ages 43 years through ages 16 to 80 years with a history of 3 consecutive normal Pap tests.  Fecal occult blood test (FOBT) of stool. / Every year beginning at age 22 years and continuing until age 70 years. You may not need to do this test if you get a colonoscopy every 10 years.  Flexible sigmoidoscopy or colonoscopy.** / Every 5 years for a  flexible sigmoidoscopy or every 10 years for a colonoscopy beginning at age 71 years and continuing until age 43 years.  Hepatitis C blood test.** / For all people born from [birth year] through 1964 and any individual with known risks for hepatitis C.  Skin self-exam. / Monthly.  Influenza vaccine. / Every year.  Tetanus, diphtheria, and acellular pertussis (Tdap/Td) vaccine.** / Consult your health care provider. Pregnant women should receive 1 dose of Tdap vaccine during each pregnancy. 1 dose of Td every 10 years.  Varicella vaccine.** / Consult your health care provider. Pregnant females who do not have evidence of immunity should receive the first dose after pregnancy.  Zoster vaccine.** / 1 dose for adults aged 58 years or older.  Measles, mumps, rubella (MMR) vaccine.** / You need at least 1 dose of MMR if you were born in 1956 or later. You may also need a 2nd dose. For females of childbearing age, rubella immunity should be determined. If there is no evidence of immunity, females who are not pregnant should be vaccinated. If there is no evidence of immunity, females who are pregnant should delay immunization until after pregnancy.  Pneumococcal 13-valent conjugate (PCV13) vaccine.** / Consult your health care provider.  Pneumococcal polysaccharide (PPSV23) vaccine.** / 1 to 2 doses if you smoke cigarettes or if you have certain conditions.  Meningococcal vaccine.** / Consult your health care provider.  Hepatitis A vaccine.** / Consult your  health care provider.  Hepatitis B vaccine.** / Consult your health care provider.  Haemophilus influenzae type b (Hib) vaccine.** / Consult your health care provider. Ages 37 years and over  Blood pressure check.** / Every 1 to 2 years.  Lipid and cholesterol check.** / Every 5 years beginning at age 44 years.  Lung cancer screening. / Every year if you are aged 70-80 years and have a 30-pack-year history of smoking and currently smoke or have  quit within the past 15 years. Yearly screening is stopped once you have quit smoking for at least 15 years or develop a health problem that would prevent you from having lung cancer treatment.  Clinical breast exam.** / Every year after age 23 years.  BRCA-related cancer risk assessment.** / For women who have family members with a BRCA-related cancer (breast, ovarian, tubal, or peritoneal cancers).  Mammogram.** / Every year beginning at age 87 years and continuing for as long as you are in good health. Consult with your health care provider.  Pap test.** / Every 3 years starting at age 60 years through age 9 or 90 years with 3 consecutive normal Pap tests. Testing can be stopped between 65 and 70 years with 3 consecutive normal Pap tests and no abnormal Pap or HPV tests in the past 10 years.  HPV screening.** / Every 3 years from ages 33 years through ages 50 or 62 years with a history of 3 consecutive normal Pap tests. Testing can be stopped between 65 and 70 years with 3 consecutive normal Pap tests and no abnormal Pap or HPV tests in the past 10 years.  Fecal occult blood test (FOBT) of stool. / Every year beginning at age 44 years and continuing until age 31 years. You may not need to do this test if you get a colonoscopy every 10 years.  Flexible sigmoidoscopy or colonoscopy.** / Every 5 years for a flexible sigmoidoscopy or every 10 years for a colonoscopy beginning at age 31 years and continuing until age 87 years.  Hepatitis C blood test.** / For all people born from [birth year] through 1964 and any individual with known risks for hepatitis C.  Osteoporosis screening.** / A one-time screening for women ages 40 years and over and women at risk for fractures or osteoporosis.  Skin self-exam. / Monthly.  Influenza vaccine. / Every year.  Tetanus, diphtheria, and acellular pertussis (Tdap/Td) vaccine.** / 1 dose of Td every 10 years.  Varicella vaccine.** / Consult your health care  provider.  Zoster vaccine.** / 1 dose for adults aged 91 years or older.  Pneumococcal 13-valent conjugate (PCV13) vaccine.** / Consult your health care provider.  Pneumococcal polysaccharide (PPSV23) vaccine.** / 1 dose for all adults aged 5 years and older.  Meningococcal vaccine.** / Consult your health care provider.  Hepatitis A vaccine.** / Consult your health care provider.  Hepatitis B vaccine.** / Consult your health care provider.  Haemophilus influenzae type b (Hib) vaccine.** / Consult your health care provider. ** Family history and personal history of risk and conditions may change your health care provider's recommendations. Document Released: 11/26/2001 Document Revised: 02/14/2014 Document Reviewed: 02/25/2011 [facility] Patient Information 2015 [place], [place]. This information is not intended to replace advice given to you by your health care provider. Make sure you discuss any questions you have with your health care provider.

## 2015-04-14 NOTE — Progress Notes (Signed)
Pre visit review using our clinic review tool, if applicable. No additional management support is needed unless otherwise documented below in the visit note. 

## 2015-04-23 NOTE — Assessment & Plan Note (Signed)
Good response to Citalopram continue the same

## 2015-04-23 NOTE — Assessment & Plan Note (Signed)
Encouraged heart healthy diet, increase exercise, avoid trans fats, consider a krill oil cap daily 

## 2015-04-23 NOTE — Assessment & Plan Note (Signed)
Good response to Kariva, no changes

## 2015-04-23 NOTE — Assessment & Plan Note (Signed)
Doing well on Vyvanse

## 2015-04-23 NOTE — Assessment & Plan Note (Signed)
Patient encouraged to maintain heart healthy diet, regular exercise, adequate sleep. Consider daily probiotics. Take medications as prescribed 

## 2015-04-23 NOTE — Assessment & Plan Note (Signed)
No recent flares. Encouraged increased hydration, 64 ounces of clear fluids daily. Minimize alcohol and caffeine. Eat small frequent meals with lean proteins and complex carbs. Avoid high and low blood sugars. Get adequate sleep, 7-8 hours a night. Needs exercise daily preferably in the morning. 

## 2015-05-19 ENCOUNTER — Encounter: Payer: Self-pay | Admitting: Family Medicine

## 2015-05-22 ENCOUNTER — Ambulatory Visit: Payer: BLUE CROSS/BLUE SHIELD | Admitting: Family Medicine

## 2015-06-20 ENCOUNTER — Ambulatory Visit (INDEPENDENT_AMBULATORY_CARE_PROVIDER_SITE_OTHER): Payer: BLUE CROSS/BLUE SHIELD | Admitting: Family Medicine

## 2015-06-20 ENCOUNTER — Encounter: Payer: Self-pay | Admitting: Family Medicine

## 2015-06-20 VITALS — BP 102/76 | HR 87 | Temp 98.5°F | Ht 65.0 in | Wt 199.2 lb

## 2015-06-20 DIAGNOSIS — F909 Attention-deficit hyperactivity disorder, unspecified type: Secondary | ICD-10-CM

## 2015-06-20 DIAGNOSIS — N946 Dysmenorrhea, unspecified: Secondary | ICD-10-CM

## 2015-06-20 DIAGNOSIS — G4700 Insomnia, unspecified: Secondary | ICD-10-CM

## 2015-06-20 DIAGNOSIS — F32A Depression, unspecified: Secondary | ICD-10-CM

## 2015-06-20 DIAGNOSIS — F329 Major depressive disorder, single episode, unspecified: Secondary | ICD-10-CM | POA: Diagnosis not present

## 2015-06-20 DIAGNOSIS — E663 Overweight: Secondary | ICD-10-CM

## 2015-06-20 DIAGNOSIS — F988 Other specified behavioral and emotional disorders with onset usually occurring in childhood and adolescence: Secondary | ICD-10-CM

## 2015-06-20 DIAGNOSIS — E782 Mixed hyperlipidemia: Secondary | ICD-10-CM

## 2015-06-20 MED ORDER — RIZATRIPTAN BENZOATE 10 MG PO TABS: ORAL_TABLET | ORAL | Status: DC

## 2015-06-20 MED ORDER — DESOGESTREL-ETHINYL ESTRADIOL 0.15-0.02/0.01 MG (21/5) PO TABS: 1.0000 | ORAL_TABLET | Freq: Every day | ORAL | Status: DC

## 2015-06-20 MED ORDER — LISDEXAMFETAMINE DIMESYLATE 60 MG PO CAPS: 60.0000 mg | ORAL_CAPSULE | ORAL | Status: DC

## 2015-06-20 MED ORDER — CITALOPRAM HYDROBROMIDE 20 MG PO TABS: 20.0000 mg | ORAL_TABLET | Freq: Every day | ORAL | Status: DC

## 2015-06-20 NOTE — Patient Instructions (Signed)
DASH Eating Plan °DASH stands for "Dietary Approaches to Stop Hypertension." The DASH eating plan is a healthy eating plan that has been shown to reduce high blood pressure (hypertension). Additional health benefits may include reducing the risk of type 2 diabetes mellitus, heart disease, and stroke. The DASH eating plan may also help with weight loss. °WHAT DO I NEED TO KNOW ABOUT THE DASH EATING PLAN? °For the DASH eating plan, you will follow these general guidelines: °· Choose foods with a percent daily value for sodium of less than 5% (as listed on the food label). °· Use salt-free seasonings or herbs instead of table salt or sea salt. °· Check with your health care provider or pharmacist before using salt substitutes. °· Eat lower-sodium products, often labeled as "lower sodium" or "no salt added." °· Eat fresh foods. °· Eat more vegetables, fruits, and low-fat dairy products. °· Choose whole grains. Look for the word "whole" as the first word in the ingredient list. °· Choose fish and skinless chicken or turkey more often than red meat. Limit fish, poultry, and meat to 6 oz (170 g) each day. °· Limit sweets, desserts, sugars, and sugary drinks. °· Choose heart-healthy fats. °· Limit cheese to 1 oz (28 g) per day. °· Eat more home-cooked food and less restaurant, buffet, and fast food. °· Limit fried foods. °· Cook foods using methods other than frying. °· Limit canned vegetables. If you do use them, rinse them well to decrease the sodium. °· When eating at a restaurant, ask that your food be prepared with less salt, or no salt if possible. °WHAT FOODS CAN I EAT? °Seek help from a dietitian for individual calorie needs. °Grains °Whole grain or whole wheat bread. Brown rice. Whole grain or whole wheat pasta. Quinoa, bulgur, and whole grain cereals. Low-sodium cereals. Corn or whole wheat flour tortillas. Whole grain cornbread. Whole grain crackers. Low-sodium crackers. °Vegetables °Fresh or frozen vegetables  (raw, steamed, roasted, or grilled). Low-sodium or reduced-sodium tomato and vegetable juices. Low-sodium or reduced-sodium tomato sauce and paste. Low-sodium or reduced-sodium canned vegetables.  °Fruits °All fresh, canned (in natural juice), or frozen fruits. °Meat and Other Protein Products °Ground beef (85% or leaner), grass-fed beef, or beef trimmed of fat. Skinless chicken or turkey. Ground chicken or turkey. Pork trimmed of fat. All fish and seafood. Eggs. Dried beans, peas, or lentils. Unsalted nuts and seeds. Unsalted canned beans. °Dairy °Low-fat dairy products, such as skim or 1% milk, 2% or reduced-fat cheeses, low-fat ricotta or cottage cheese, or plain low-fat yogurt. Low-sodium or reduced-sodium cheeses. °Fats and Oils °Tub margarines without trans fats. Light or reduced-fat mayonnaise and salad dressings (reduced sodium). Avocado. Safflower, olive, or canola oils. Natural peanut or almond butter. °Other °Unsalted popcorn and pretzels. °The items listed above may not be a complete list of recommended foods or beverages. Contact your dietitian for more options. °WHAT FOODS ARE NOT RECOMMENDED? °Grains °White bread. White pasta. White rice. Refined cornbread. Bagels and croissants. Crackers that contain trans fat. °Vegetables °Creamed or fried vegetables. Vegetables in a cheese sauce. Regular canned vegetables. Regular canned tomato sauce and paste. Regular tomato and vegetable juices. °Fruits °Dried fruits. Canned fruit in light or heavy syrup. Fruit juice. °Meat and Other Protein Products °Fatty cuts of meat. Ribs, chicken wings, bacon, sausage, bologna, salami, chitterlings, fatback, hot dogs, bratwurst, and packaged luncheon meats. Salted nuts and seeds. Canned beans with salt. °Dairy °Whole or 2% milk, cream, half-and-half, and cream cheese. Whole-fat or sweetened yogurt. Full-fat   cheeses or blue cheese. Nondairy creamers and whipped toppings. Processed cheese, cheese spreads, or cheese  curds. °Condiments °Onion and garlic salt, seasoned salt, table salt, and sea salt. Canned and packaged gravies. Worcestershire sauce. Tartar sauce. Barbecue sauce. Teriyaki sauce. Soy sauce, including reduced sodium. Steak sauce. Fish sauce. Oyster sauce. Cocktail sauce. Horseradish. Ketchup and mustard. Meat flavorings and tenderizers. Bouillon cubes. Hot sauce. Tabasco sauce. Marinades. Taco seasonings. Relishes. °Fats and Oils °Butter, stick margarine, lard, shortening, ghee, and bacon fat. Coconut, palm kernel, or palm oils. Regular salad dressings. °Other °Pickles and olives. Salted popcorn and pretzels. °The items listed above may not be a complete list of foods and beverages to avoid. Contact your dietitian for more information. °WHERE CAN I FIND MORE INFORMATION? °National [employer], [employer], [employer] Institute: www.nhlbi.nih.gov/health/health-topics/topics/dash/ °Document Released: 09/19/2011 Document Revised: 02/14/2014 Document Reviewed: 08/04/2013 °[employer]® Patient Information ©2015 [employer]. This information is not intended to replace advice given to you by your health care provider. Make sure you discuss any questions you have with your health care provider. ° °

## 2015-06-20 NOTE — Progress Notes (Signed)
Pre visit review using our clinic review tool, if applicable. No additional management support is needed unless otherwise documented below in the visit note. 

## 2015-07-02 NOTE — Assessment & Plan Note (Signed)
Encouraged heart healthy diet, increase exercise, avoid trans fats, consider a krill oil cap daily 

## 2015-07-02 NOTE — Assessment & Plan Note (Signed)
Encouraged good sleep hygiene such as dark, quiet room. No blue/green glowing lights such as computer screens in bedroom. No alcohol or stimulants in evening. Cut down on caffeine as able. Regular exercise is helpful but not just prior to bed time.  

## 2015-07-02 NOTE — Assessment & Plan Note (Signed)
Well controlled on OCP

## 2015-07-02 NOTE — Assessment & Plan Note (Signed)
Encouraged DASH diet, decrease po intake and increase exercise as tolerated. Needs 7-8 hours of sleep nightly. Avoid trans fats, eat small, frequent meals every 4-5 hours with lean proteins, complex carbs and healthy fats. Minimize simple carbs, GMO foods. 

## 2015-07-02 NOTE — Progress Notes (Signed)
Bonnie Lewis [id number] July 24, 1986 07/02/2015      Progress Note New Patient  Subjective  Chief Complaint  Chief Complaint  Patient presents with  . Follow-up    medication    HPI  Patient is a 29 year old Caucasian female who is in today for follow-up. She feels well. There's been no recent illness. She is frustrated with recent weight gain. Continues to have trouble sleeping at times and acknowledges she is not exercising routinely. Tolerates her Vyvanse and does feel it is managing her symptoms well. Reports medications are helping for dysmenorrhea and depression as well and overall she offers no acute complaints. Denies CP/palp/SOB/HA/congestion/fevers/GI or GU c/o. Taking meds as prescribed  Past Medical History  Diagnosis Date  . ADD (attention deficit disorder)   . Bronchitis 08/11/2011  . LLQ pain 08/11/2011  . Anemia 09/12/2011  . Insomnia 09/12/2011  . Preventative health care 09/16/2011  . Seasonal affective disorder 10/19/2012  . Cervical cancer 10/19/2012  . Cervical cancer screening 10/19/2012  . Vaginitis 10/19/2012  . Dysmenorrhea 10/20/2012  . Dyslipidemia 10/20/2012  . Migraine 05/14/2013  . Acne 04/14/2015    History reviewed. No pertinent past surgical history.  Family History  Problem Relation Age of Onset  . Asthma Father   . Depression Father     and anxiety  . Allergies Father   . Irritable bowel syndrome Father   . Dementia Father   . Depression Sister   . Irritable bowel syndrome Sister   . Migraines Sister   . Depression Brother   . Allergies Brother   . Depression Maternal Grandmother     Anxiety  . Hypertension Maternal Grandmother   . ADD / ADHD Maternal Grandmother   . Menstrual problems Maternal Grandmother     heavy bleeding requiring hysterectomy  . Depression Paternal Grandmother   . Cardiomyopathy Paternal Grandmother   . Menstrual problems Paternal Grandmother   . Hyperlipidemia Mother   . Anemia Sister   . Coronary artery disease  Paternal Grandfather   . Cancer Paternal Aunt     Social History   Social History  . Marital Status: Single    Spouse Name: N/A  . Number of Children: N/A  . Years of Education: N/A   Occupational History  . Not on file.   Social History Main Topics  . Smoking status: Never Smoker   . Smokeless tobacco: Never Used  . Alcohol Use: No  . Drug Use: No  . Sexual Activity: No     Comment: lives at home to help her mother with her father who has early onset Alzheimer's   Other Topics Concern  . Not on file   Social History Narrative    No current outpatient prescriptions on file prior to visit.   No current facility-administered medications on file prior to visit.    Allergies  Allergen Reactions  . Sulfonamide Derivatives     Review of Systems  Review of Systems  Constitutional: Positive for malaise/fatigue. Negative for fever.  HENT: Negative for congestion.   Eyes: Negative for discharge.  Respiratory: Negative for shortness of breath.   Cardiovascular: Negative for chest pain, palpitations and leg swelling.  Gastrointestinal: Negative for nausea and abdominal pain.  Genitourinary: Negative for dysuria.  Musculoskeletal: Negative for falls.  Skin: Negative for rash.  Neurological: Negative for loss of consciousness and headaches.  Endo/Heme/Allergies: Negative for environmental allergies.  Psychiatric/Behavioral: Negative for depression. The patient has insomnia. The patient is not nervous/anxious.     Objective  BP 102/76 mmHg  Pulse 87  Temp(Src) 98.5 F (36.9 C) (Oral)  Ht 5\' 5"  (1.651 m)  Wt 199 lb 4 oz (90.379 kg)  BMI 33.16 kg/m2  SpO2 97%  LMP 06/13/2015  Physical Exam  Physical Exam  Constitutional: She is oriented to person, place, and time and well-developed, well-nourished, and in no distress. No distress.  HENT:  Head: Normocephalic and atraumatic.  Eyes: Conjunctivae are normal.  Neck: Neck supple. No thyromegaly present.   Cardiovascular: Normal rate, regular rhythm and normal heart sounds.   No murmur heard. Pulmonary/Chest: Effort normal and breath sounds normal. She has no wheezes.  Abdominal: She exhibits no distension and no mass.  Musculoskeletal: She exhibits no edema.  Lymphadenopathy:    She has no cervical adenopathy.  Neurological: She is alert and oriented to person, place, and time.  Skin: Skin is warm and dry. No rash noted. She is not diaphoretic.  Psychiatric: Memory, affect and judgment normal.       Assessment & Plan  ADD (attention deficit disorder) Doing well with Vyvanse doing well. Given refills today  Insomnia Encouraged good sleep hygiene such as dark, quiet room. No blue/green glowing lights such as computer screens in bedroom. No alcohol or stimulants in evening. Cut down on caffeine as able. Regular exercise is helpful but not just prior to bed time.   Dysmenorrhea Well controlled on OCP  Mixed hyperlipidemia Encouraged heart healthy diet, increase exercise, avoid trans fats, consider a krill oil cap daily  Overweight Encouraged DASH diet, decrease po intake and increase exercise as tolerated. Needs 7-8 hours of sleep nightly. Avoid trans fats, eat small, frequent meals every 4-5 hours with lean proteins, complex carbs and healthy fats. Minimize simple carbs, GMO foods.

## 2015-07-02 NOTE — Assessment & Plan Note (Signed)
Doing well with Vyvanse doing well. Given refills today

## 2015-09-13 ENCOUNTER — Telehealth: Payer: Self-pay | Admitting: Family Medicine

## 2015-09-13 NOTE — Telephone Encounter (Signed)
LM for pt to sched flu shot or notify us if already received

## 2015-09-17 ENCOUNTER — Other Ambulatory Visit: Payer: Self-pay | Admitting: Family Medicine

## 2015-09-18 ENCOUNTER — Other Ambulatory Visit: Payer: Self-pay | Admitting: Family Medicine

## 2015-09-18 MED ORDER — LISDEXAMFETAMINE DIMESYLATE 60 MG PO CAPS: 60.0000 mg | ORAL_CAPSULE | ORAL | Status: DC

## 2015-09-18 NOTE — Telephone Encounter (Signed)
Printed per PCP instructions. Printed prescription for Jan. 2017 and Feb. 2017 Patients next appt. Scheduled the first of march 2017.

## 2015-09-19 ENCOUNTER — Telehealth: Payer: Self-pay | Admitting: Family Medicine

## 2015-09-19 NOTE — Telephone Encounter (Signed)
Documented in her chart.

## 2015-09-19 NOTE — Telephone Encounter (Signed)
Caller name:Bonnie Lewis Relationship to patient:self  Can be reached:[phone] Pharmacy:  Reason for call:She has had her flu shot.  She had it done at [facility]. On October 1st

## 2015-12-02 ENCOUNTER — Encounter: Payer: Self-pay | Admitting: Family Medicine

## 2015-12-04 ENCOUNTER — Other Ambulatory Visit: Payer: Self-pay | Admitting: Family Medicine

## 2015-12-04 MED ORDER — FLUCONAZOLE 150 MG PO TABS: 150.0000 mg | ORAL_TABLET | ORAL | Status: DC

## 2015-12-13 ENCOUNTER — Other Ambulatory Visit: Payer: Self-pay | Admitting: Family Medicine

## 2015-12-14 ENCOUNTER — Other Ambulatory Visit: Payer: Self-pay | Admitting: Family Medicine

## 2015-12-14 MED ORDER — LISDEXAMFETAMINE DIMESYLATE 60 MG PO CAPS: 60.0000 mg | ORAL_CAPSULE | ORAL | Status: DC

## 2015-12-16 ENCOUNTER — Encounter: Payer: Self-pay | Admitting: Family Medicine

## 2015-12-18 ENCOUNTER — Ambulatory Visit: Payer: BLUE CROSS/BLUE SHIELD | Admitting: Family Medicine

## 2015-12-18 ENCOUNTER — Other Ambulatory Visit: Payer: Self-pay | Admitting: Family Medicine

## 2015-12-18 DIAGNOSIS — F329 Major depressive disorder, single episode, unspecified: Secondary | ICD-10-CM

## 2015-12-18 DIAGNOSIS — F32A Depression, unspecified: Secondary | ICD-10-CM

## 2015-12-18 MED ORDER — CITALOPRAM HYDROBROMIDE 20 MG PO TABS: 20.0000 mg | ORAL_TABLET | Freq: Every day | ORAL | Status: DC

## 2015-12-22 ENCOUNTER — Ambulatory Visit: Payer: BLUE CROSS/BLUE SHIELD | Admitting: Family Medicine

## 2016-01-09 ENCOUNTER — Ambulatory Visit (INDEPENDENT_AMBULATORY_CARE_PROVIDER_SITE_OTHER): Payer: BLUE CROSS/BLUE SHIELD | Admitting: Family Medicine

## 2016-01-09 ENCOUNTER — Encounter: Payer: Self-pay | Admitting: Family Medicine

## 2016-01-09 VITALS — BP 124/78 | HR 88 | Temp 99.5°F | Ht 65.0 in | Wt 207.4 lb

## 2016-01-09 DIAGNOSIS — E663 Overweight: Secondary | ICD-10-CM

## 2016-01-09 DIAGNOSIS — J029 Acute pharyngitis, unspecified: Secondary | ICD-10-CM | POA: Diagnosis not present

## 2016-01-09 DIAGNOSIS — F909 Attention-deficit hyperactivity disorder, unspecified type: Secondary | ICD-10-CM | POA: Diagnosis not present

## 2016-01-09 DIAGNOSIS — G43809 Other migraine, not intractable, without status migrainosus: Secondary | ICD-10-CM

## 2016-01-09 DIAGNOSIS — F988 Other specified behavioral and emotional disorders with onset usually occurring in childhood and adolescence: Secondary | ICD-10-CM

## 2016-01-09 MED ORDER — METHOCARBAMOL 500 MG PO TABS: 500.0000 mg | ORAL_TABLET | Freq: Two times a day (BID) | ORAL | Status: DC

## 2016-01-09 MED ORDER — LISDEXAMFETAMINE DIMESYLATE 60 MG PO CAPS: 60.0000 mg | ORAL_CAPSULE | ORAL | Status: DC

## 2016-01-09 MED ORDER — AMOXICILLIN 500 MG PO CAPS: 500.0000 mg | ORAL_CAPSULE | Freq: Three times a day (TID) | ORAL | Status: DC

## 2016-01-09 MED ORDER — RIZATRIPTAN BENZOATE 10 MG PO TABS: ORAL_TABLET | ORAL | Status: DC

## 2016-01-09 NOTE — Patient Instructions (Signed)

## 2016-01-09 NOTE — Progress Notes (Signed)
Subjective:    Patient ID: Bonnie Lewis, female    DOB: 12-03-1985, 30 y.o.   MRN: [id number]  Chief Complaint  Patient presents with  . Follow-up    HPI Patient is in today for follow up some concerns, with sore throat, and some pain with lymph node under arm.  No fevers, chills, malaise, myalgias. Does believe the symptoms are improving. Have been present just over a month. Denies CP/palp/SOB/HAfevers/GI or GU c/o. Taking meds as prescribed  Past Medical History  Diagnosis Date  . ADD (attention deficit disorder)   . Bronchitis 08/11/2011  . LLQ pain 08/11/2011  . Anemia 09/12/2011  . Insomnia 09/12/2011  . Preventative health care 09/16/2011  . Seasonal affective disorder ([place]) 10/19/2012  . Cervical cancer ([place]) 10/19/2012  . Cervical cancer screening 10/19/2012  . Vaginitis 10/19/2012  . Dysmenorrhea 10/20/2012  . Dyslipidemia 10/20/2012  . Migraine 05/14/2013  . Acne 04/14/2015  . Viral pharyngitis 01/10/2016    No past surgical history on file.  Family History  Problem Relation Age of Onset  . Asthma Father   . Depression Father     and anxiety  . Allergies Father   . Irritable bowel syndrome Father   . Dementia Father   . Depression Sister   . Irritable bowel syndrome Sister   . Migraines Sister   . Depression Brother   . Allergies Brother   . Depression Maternal Grandmother     Anxiety  . Hypertension Maternal Grandmother   . ADD / ADHD Maternal Grandmother   . Menstrual problems Maternal Grandmother     heavy bleeding requiring hysterectomy  . Depression Paternal Grandmother   . Cardiomyopathy Paternal Grandmother   . Menstrual problems Paternal Grandmother   . Hyperlipidemia Mother   . Anemia Sister   . Coronary artery disease Paternal Grandfather   . Cancer Paternal Aunt     Social History   Social History  . Marital Status: Single    Spouse Name: N/A  . Number of Children: N/A  . Years of Education: N/A   Occupational History  . Not on file.    Social History Main Topics  . Smoking status: Never Smoker   . Smokeless tobacco: Never Used  . Alcohol Use: No  . Drug Use: No  . Sexual Activity: No     Comment: lives at home to help her mother with her father who has early onset Alzheimer's   Other Topics Concern  . Not on file   Social History Narrative    Outpatient Prescriptions Prior to Visit  Medication Sig Dispense Refill  . citalopram (CELEXA) 20 MG tablet Take 1 tablet (20 mg total) by mouth daily. 30 tablet 5  . desogestrel-ethinyl estradiol (KARIVA) 0.15-0.02/0.01 MG (21/5) tablet Take 1 tablet by mouth daily. 1 Package 11  . fluconazole (DIFLUCAN) 150 MG tablet Take 1 tablet (150 mg total) by mouth once a week. 2 tablet 1  . lisdexamfetamine (VYVANSE) 60 MG capsule Take 1 capsule (60 mg total) by mouth every morning. January 2017 30 capsule 0  . lisdexamfetamine (VYVANSE) 60 MG capsule Take 1 capsule (60 mg total) by mouth every morning. February 2017 30 capsule 0  . lisdexamfetamine (VYVANSE) 60 MG capsule Take 1 capsule (60 mg total) by mouth every morning. March 2017 30 capsule 0  . rizatriptan (MAXALT) 10 MG tablet TAKE 1 TABLET (10 MG TOTAL) BY MOUTH AS NEEDED FOR MIGRAINE. MAY REPEAT IN 2 HOURS IF NEEDED 10 tablet 1  No facility-administered medications prior to visit.    Allergies  Allergen Reactions  . Sulfonamide Derivatives     Review of Systems  Constitutional: Negative for fever and malaise/fatigue.  HENT: Positive for congestion and sore throat.   Eyes: Negative for blurred vision.  Respiratory: Negative for shortness of breath.   Cardiovascular: Negative for chest pain, palpitations and leg swelling.  Gastrointestinal: Negative for nausea, abdominal pain and blood in stool.  Genitourinary: Negative for dysuria and frequency.  Musculoskeletal: Negative for falls.  Skin: Negative for rash.  Neurological: Positive for headaches. Negative for dizziness and loss of consciousness.   Endo/Heme/Allergies: Negative for environmental allergies.  Psychiatric/Behavioral: Negative for depression. The patient is not nervous/anxious.        Objective:    Physical Exam  Constitutional: She is oriented to person, place, and time. She appears well-developed and well-nourished. No distress.  HENT:  Head: Normocephalic and atraumatic.  Eyes: Conjunctivae are normal.  Neck: Neck supple. No thyromegaly present.  Cardiovascular: Normal rate, regular rhythm and normal heart sounds.   No murmur heard. Pulmonary/Chest: Effort normal and breath sounds normal. No respiratory distress.  Abdominal: Soft. Bowel sounds are normal. She exhibits no distension and no mass. There is no tenderness.  Musculoskeletal: She exhibits no edema.  Lymphadenopathy:    She has no cervical adenopathy.  Neurological: She is alert and oriented to person, place, and time.  Skin: Skin is warm and dry.  Psychiatric: She has a normal mood and affect. Her behavior is normal.    BP 124/78 mmHg  Pulse 88  Temp(Src) 99.5 F (37.5 C) (Oral)  Ht 5\' 5"  (1.651 m)  Wt 207 lb 6 oz (94.065 kg)  BMI 34.51 kg/m2  SpO2 99% Wt Readings from Last 3 Encounters:  01/09/16 207 lb 6 oz (94.065 kg)  06/20/15 199 lb 4 oz (90.379 kg)  04/14/15 193 lb (87.544 kg)     Lab Results  Component Value Date   WBC 7.7 05/05/2013   HGB 11.5* 05/05/2013   HCT 33.9* 05/05/2013   PLT 288.0 05/05/2013   GLUCOSE 78 05/05/2013   CHOL 177 05/05/2013   TRIG 38.0 05/05/2013   HDL 73.90 05/05/2013   LDLDIRECT 110.1 10/19/2012   LDLCALC 96 05/05/2013   ALT 19 05/05/2013   AST 21 05/05/2013   NA 137 05/05/2013   K 3.8 05/05/2013   CL 104 05/05/2013   CREATININE 0.8 05/05/2013   BUN 16 05/05/2013   CO2 26 05/05/2013   TSH 1.79 05/05/2013    Lab Results  Component Value Date   TSH 1.79 05/05/2013   Lab Results  Component Value Date   WBC 7.7 05/05/2013   HGB 11.5* 05/05/2013   HCT 33.9* 05/05/2013   MCV 87.7  05/05/2013   PLT 288.0 05/05/2013   Lab Results  Component Value Date   NA 137 05/05/2013   K 3.8 05/05/2013   CO2 26 05/05/2013   GLUCOSE 78 05/05/2013   BUN 16 05/05/2013   CREATININE 0.8 05/05/2013   BILITOT 0.5 05/05/2013   ALKPHOS 45 05/05/2013   AST 21 05/05/2013   ALT 19 05/05/2013   PROT 6.3 05/05/2013   ALBUMIN 3.9 05/05/2013   ALBUMIN 3.9 05/05/2013   CALCIUM 9.1 05/05/2013   GFR 92.20 05/05/2013   Lab Results  Component Value Date   CHOL 177 05/05/2013   Lab Results  Component Value Date   HDL 73.90 05/05/2013   Lab Results  Component Value Date   LDLCALC 96  05/05/2013   Lab Results  Component Value Date   TRIG 38.0 05/05/2013   Lab Results  Component Value Date   CHOLHDL 2 05/05/2013   No results found for: HGBA1C     Assessment & Plan:   Problem List Items Addressed This Visit    ADD (attention deficit disorder) - Primary    Doing well on Vyvanse given 3 rf today      Migraine    Slight increase in frequency refilled Maxalt. Encouraged increased hydration, 64 ounces of clear fluids daily. Minimize alcohol and caffeine. Eat small frequent meals with lean proteins and complex carbs. Avoid high and low blood sugars. Get adequate sleep, 7-8 hours a night. Needs exercise daily preferably in the morning.      Relevant Medications   rizatriptan (MAXALT) 10 MG tablet   methocarbamol (ROBAXIN) 500 MG tablet   Overweight    Encouraged DASH diet, decrease po intake and increase exercise as tolerated. Needs 7-8 hours of sleep nightly. Avoid trans fats, eat small, frequent meals every 4-5 hours with lean proteins, complex carbs and healthy fats. Minimize simple carbs      Viral pharyngitis    Encouraged rest, zinc, fluids, elderberry, vitamin C and report worsening symptoms         I have discontinued Ms. Shasteen's fluconazole. I have also changed her lisdexamfetamine, lisdexamfetamine, and lisdexamfetamine. Additionally, I am having her start on  amoxicillin and methocarbamol. Lastly, I am having her maintain her desogestrel-ethinyl estradiol, citalopram, and rizatriptan.  Meds ordered this encounter  Medications  . lisdexamfetamine (VYVANSE) 60 MG capsule    Sig: Take 1 capsule (60 mg total) by mouth every morning. June 2017    Dispense:  30 capsule    Refill:  0  . lisdexamfetamine (VYVANSE) 60 MG capsule    Sig: Take 1 capsule (60 mg total) by mouth every morning. May  2017    Dispense:  30 capsule    Refill:  0  . lisdexamfetamine (VYVANSE) 60 MG capsule    Sig: Take 1 capsule (60 mg total) by mouth every morning. April  2017    Dispense:  30 capsule    Refill:  0  . rizatriptan (MAXALT) 10 MG tablet    Sig: TAKE 1 TABLET (10 MG TOTAL) BY MOUTH AS NEEDED FOR MIGRAINE. MAY REPEAT IN 2 HOURS IF NEEDED    Dispense:  10 tablet    Refill:  1  . amoxicillin (AMOXIL) 500 MG capsule    Sig: Take 1 capsule (500 mg total) by mouth 3 (three) times daily. X 10 days    Dispense:  30 capsule    Refill:  0  . methocarbamol (ROBAXIN) 500 MG tablet    Sig: Take 1 tablet (500 mg total) by mouth 2 (two) times daily.    Dispense:  60 tablet    Refill:  2     Penni Homans, MD

## 2016-01-09 NOTE — Assessment & Plan Note (Signed)
Encouraged DASH diet, decrease po intake and increase exercise as tolerated. Needs 7-8 hours of sleep nightly. Avoid trans fats, eat small, frequent meals every 4-5 hours with lean proteins, complex carbs and healthy fats. Minimize simple carbs 

## 2016-01-09 NOTE — Assessment & Plan Note (Signed)
Doing well on Vyvanse given 3 rf today

## 2016-01-09 NOTE — Progress Notes (Signed)
Pre visit review using our clinic review tool, if applicable. No additional management support is needed unless otherwise documented below in the visit note. 

## 2016-01-09 NOTE — Assessment & Plan Note (Addendum)
Slight increase in frequency refilled Maxalt. Encouraged increased hydration, 64 ounces of clear fluids daily. Minimize alcohol and caffeine. Eat small frequent meals with lean proteins and complex carbs. Avoid high and low blood sugars. Get adequate sleep, 7-8 hours a night. Needs exercise daily preferably in the morning.

## 2016-01-10 ENCOUNTER — Encounter: Payer: Self-pay | Admitting: Family Medicine

## 2016-01-10 DIAGNOSIS — J029 Acute pharyngitis, unspecified: Secondary | ICD-10-CM | POA: Insufficient documentation

## 2016-01-10 NOTE — Assessment & Plan Note (Signed)
Encouraged rest, zinc, fluids, elderberry, vitamin C and report worsening symptoms

## 2016-01-28 ENCOUNTER — Encounter: Payer: Self-pay | Admitting: Family Medicine

## 2016-01-29 ENCOUNTER — Ambulatory Visit (INDEPENDENT_AMBULATORY_CARE_PROVIDER_SITE_OTHER): Payer: BLUE CROSS/BLUE SHIELD | Admitting: Family Medicine

## 2016-01-29 VITALS — BP 124/86 | HR 107 | Temp 99.0°F | Ht 64.75 in | Wt 211.0 lb

## 2016-01-29 DIAGNOSIS — J209 Acute bronchitis, unspecified: Secondary | ICD-10-CM | POA: Diagnosis not present

## 2016-01-29 DIAGNOSIS — F909 Attention-deficit hyperactivity disorder, unspecified type: Secondary | ICD-10-CM

## 2016-01-29 DIAGNOSIS — F988 Other specified behavioral and emotional disorders with onset usually occurring in childhood and adolescence: Secondary | ICD-10-CM

## 2016-01-29 MED ORDER — AZITHROMYCIN 250 MG PO TABS: ORAL_TABLET | ORAL | Status: DC

## 2016-01-29 MED ORDER — METHYLPREDNISOLONE ACETATE 40 MG/ML IJ SUSP: 40.0000 mg | Freq: Once | INTRAMUSCULAR | Status: DC

## 2016-01-29 MED ORDER — HYDROCODONE-HOMATROPINE 5-1.5 MG/5ML PO SYRP: 5.0000 mL | ORAL_SOLUTION | Freq: Three times a day (TID) | ORAL | Status: DC | PRN

## 2016-01-29 NOTE — Patient Instructions (Signed)

## 2016-02-04 DIAGNOSIS — J209 Acute bronchitis, unspecified: Secondary | ICD-10-CM | POA: Insufficient documentation

## 2016-02-04 NOTE — Progress Notes (Signed)
Patient ID: Bonnie Lewis, female   DOB: Feb 06, 1986, 30 y.o.   MRN: [id number]   Subjective:    Patient ID: Bonnie Lewis, female    DOB: 1986/02/21, 30 y.o.   MRN: [id number]  Chief Complaint  Patient presents with  . Cough    x3 days, sore throat, productive, ear pain completed abts on april 7th    HPI Patient is in today for evaluation of cough. She noting several weeks of worsening cough productive of green phlegm. She has chest pain with coughing now. As well as malaise, myalgia, ear pain and runny nose. No obvious Fevers, chills or chest pain. No GI complaints has already taken a course of antibiotics, which provided mild temporary relief.   Past Medical History  Diagnosis Date  . ADD (attention deficit disorder)   . Bronchitis 08/11/2011  . LLQ pain 08/11/2011  . Anemia 09/12/2011  . Insomnia 09/12/2011  . Preventative health care 09/16/2011  . Seasonal affective disorder ([place]) 10/19/2012  . Cervical cancer ([place]) 10/19/2012  . Cervical cancer screening 10/19/2012  . Vaginitis 10/19/2012  . Dysmenorrhea 10/20/2012  . Dyslipidemia 10/20/2012  . Migraine 05/14/2013  . Acne 04/14/2015  . Viral pharyngitis 01/10/2016    No past surgical history on file.  Family History  Problem Relation Age of Onset  . Asthma Father   . Depression Father     and anxiety  . Allergies Father   . Irritable bowel syndrome Father   . Dementia Father   . Depression Sister   . Irritable bowel syndrome Sister   . Migraines Sister   . Depression Brother   . Allergies Brother   . Depression Maternal Grandmother     Anxiety  . Hypertension Maternal Grandmother   . ADD / ADHD Maternal Grandmother   . Menstrual problems Maternal Grandmother     heavy bleeding requiring hysterectomy  . Depression Paternal Grandmother   . Cardiomyopathy Paternal Grandmother   . Menstrual problems Paternal Grandmother   . Hyperlipidemia Mother   . Anemia Sister   . Coronary artery disease Paternal Grandfather   . Cancer  Paternal Aunt     Social History   Social History  . Marital Status: Single    Spouse Name: N/A  . Number of Children: N/A  . Years of Education: N/A   Occupational History  . Not on file.   Social History Main Topics  . Smoking status: Never Smoker   . Smokeless tobacco: Never Used  . Alcohol Use: No  . Drug Use: No  . Sexual Activity: No     Comment: lives at home to help her mother with her father who has early onset Alzheimer's   Other Topics Concern  . Not on file   Social History Narrative    Outpatient Prescriptions Prior to Visit  Medication Sig Dispense Refill  . citalopram (CELEXA) 20 MG tablet Take 1 tablet (20 mg total) by mouth daily. 30 tablet 5  . desogestrel-ethinyl estradiol (KARIVA) 0.15-0.02/0.01 MG (21/5) tablet Take 1 tablet by mouth daily. 1 Package 11  . lisdexamfetamine (VYVANSE) 60 MG capsule Take 1 capsule (60 mg total) by mouth every morning. June 2017 30 capsule 0  . lisdexamfetamine (VYVANSE) 60 MG capsule Take 1 capsule (60 mg total) by mouth every morning. May  2017 30 capsule 0  . lisdexamfetamine (VYVANSE) 60 MG capsule Take 1 capsule (60 mg total) by mouth every morning. April  2017 30 capsule 0  . methocarbamol (ROBAXIN) 500 MG tablet  Take 1 tablet (500 mg total) by mouth 2 (two) times daily. 60 tablet 2  . rizatriptan (MAXALT) 10 MG tablet TAKE 1 TABLET (10 MG TOTAL) BY MOUTH AS NEEDED FOR MIGRAINE. MAY REPEAT IN 2 HOURS IF NEEDED 10 tablet 1  . amoxicillin (AMOXIL) 500 MG capsule Take 1 capsule (500 mg total) by mouth 3 (three) times daily. X 10 days 30 capsule 0   No facility-administered medications prior to visit.    Allergies  Allergen Reactions  . Sulfonamide Derivatives     Review of Systems  Constitutional: Negative for fever and malaise/fatigue.  HENT: Positive for congestion, ear pain and sore throat.   Eyes: Negative for blurred vision.  Respiratory: Positive for cough, sputum production and shortness of breath.  Negative for wheezing.   Cardiovascular: Negative for palpitations and leg swelling.  Gastrointestinal: Negative for nausea, abdominal pain and blood in stool.  Genitourinary: Negative for dysuria and frequency.  Musculoskeletal: Negative for falls.  Skin: Negative for rash.  Neurological: Negative for dizziness, loss of consciousness and headaches.  Endo/Heme/Allergies: Negative for environmental allergies.  Psychiatric/Behavioral: Negative for depression. The patient is not nervous/anxious.        Objective:    Physical Exam  Constitutional: She is oriented to person, place, and time. She appears well-developed and well-nourished. No distress.  HENT:  Head: Normocephalic and atraumatic.  Nose: Nose normal.  Eyes: Right eye exhibits no discharge. Left eye exhibits no discharge.  Neck: Normal range of motion. Neck supple.  Cardiovascular: Normal rate and regular rhythm.   No murmur heard. Pulmonary/Chest: Effort normal and breath sounds normal.  Abdominal: Soft. Bowel sounds are normal. There is no tenderness.  Musculoskeletal: She exhibits no edema.  Neurological: She is alert and oriented to person, place, and time.  Skin: Skin is warm and dry.  Psychiatric: She has a normal mood and affect.  Nursing note and vitals reviewed.   BP 124/86 mmHg  Pulse 107  Temp(Src) 99 F (37.2 C) (Oral)  Ht 5' 4.75" (1.645 m)  Wt 211 lb (95.709 kg)  BMI 35.37 kg/m2  SpO2 98% Wt Readings from Last 3 Encounters:  01/29/16 211 lb (95.709 kg)  01/09/16 207 lb 6 oz (94.065 kg)  06/20/15 199 lb 4 oz (90.379 kg)     Lab Results  Component Value Date   WBC 7.7 05/05/2013   HGB 11.5* 05/05/2013   HCT 33.9* 05/05/2013   PLT 288.0 05/05/2013   GLUCOSE 78 05/05/2013   CHOL 177 05/05/2013   TRIG 38.0 05/05/2013   HDL 73.90 05/05/2013   LDLDIRECT 110.1 10/19/2012   LDLCALC 96 05/05/2013   ALT 19 05/05/2013   AST 21 05/05/2013   NA 137 05/05/2013   K 3.8 05/05/2013   CL 104 05/05/2013    CREATININE 0.8 05/05/2013   BUN 16 05/05/2013   CO2 26 05/05/2013   TSH 1.79 05/05/2013    Lab Results  Component Value Date   TSH 1.79 05/05/2013   Lab Results  Component Value Date   WBC 7.7 05/05/2013   HGB 11.5* 05/05/2013   HCT 33.9* 05/05/2013   MCV 87.7 05/05/2013   PLT 288.0 05/05/2013   Lab Results  Component Value Date   NA 137 05/05/2013   K 3.8 05/05/2013   CO2 26 05/05/2013   GLUCOSE 78 05/05/2013   BUN 16 05/05/2013   CREATININE 0.8 05/05/2013   BILITOT 0.5 05/05/2013   ALKPHOS 45 05/05/2013   AST 21 05/05/2013   ALT 19  05/05/2013   PROT 6.3 05/05/2013   ALBUMIN 3.9 05/05/2013   ALBUMIN 3.9 05/05/2013   CALCIUM 9.1 05/05/2013   GFR 92.20 05/05/2013   Lab Results  Component Value Date   CHOL 177 05/05/2013   Lab Results  Component Value Date   HDL 73.90 05/05/2013   Lab Results  Component Value Date   LDLCALC 96 05/05/2013   Lab Results  Component Value Date   TRIG 38.0 05/05/2013   Lab Results  Component Value Date   CHOLHDL 2 05/05/2013   No results found for: HGBA1C     Assessment & Plan:   Problem List Items Addressed This Visit    Acute bronchitis - Primary    Encourage and steroids.      Relevant Medications   methylPREDNISolone acetate (DEPO-MEDROL) injection 40 mg   ADD (attention deficit disorder)    Doing well on current meds, continue the same         I have discontinued Ms. Grigg's amoxicillin. I am also having her start on HYDROcodone-homatropine and azithromycin. Additionally, I am having her maintain her desogestrel-ethinyl estradiol, citalopram, lisdexamfetamine, lisdexamfetamine, lisdexamfetamine, rizatriptan, and methocarbamol. We will continue to administer methylPREDNISolone acetate.  Meds ordered this encounter  Medications  . HYDROcodone-homatropine (HYCODAN) 5-1.5 MG/5ML syrup    Sig: Take 5 mLs by mouth every 8 (eight) hours as needed for cough.    Dispense:  180 mL    Refill:  0  .  azithromycin (ZITHROMAX) 250 MG tablet    Sig: 2 tabs po daily x once then 1 tab po daily x 4 days    Dispense:  6 tablet    Refill:  0  . methylPREDNISolone acetate (DEPO-MEDROL) injection 40 mg    Sig:      Penni Homans, MD

## 2016-02-04 NOTE — Assessment & Plan Note (Signed)
Doing well on current meds, continue the same

## 2016-02-04 NOTE — Assessment & Plan Note (Signed)
Encourage and steroids.

## 2016-03-18 ENCOUNTER — Other Ambulatory Visit: Payer: Self-pay | Admitting: Family Medicine

## 2016-03-18 DIAGNOSIS — F329 Major depressive disorder, single episode, unspecified: Secondary | ICD-10-CM

## 2016-03-18 DIAGNOSIS — F32A Depression, unspecified: Secondary | ICD-10-CM

## 2016-03-18 MED ORDER — CITALOPRAM HYDROBROMIDE 20 MG PO TABS: 20.0000 mg | ORAL_TABLET | Freq: Every day | ORAL | Status: DC

## 2016-04-01 ENCOUNTER — Other Ambulatory Visit: Payer: Self-pay | Admitting: Family Medicine

## 2016-04-01 MED ORDER — LISDEXAMFETAMINE DIMESYLATE 60 MG PO CAPS: 60.0000 mg | ORAL_CAPSULE | ORAL | Status: DC

## 2016-04-01 MED ORDER — ACYCLOVIR 400 MG PO TABS: 800.0000 mg | ORAL_TABLET | Freq: Three times a day (TID) | ORAL | Status: DC

## 2016-04-01 NOTE — Progress Notes (Signed)
Patient with appt in July will run out of Vyvanse prior to appt refill given today. Also reporting reppeated oral cold sores, will allow a trial of Acyclovir and reevaluate at visit

## 2016-04-05 ENCOUNTER — Telehealth: Payer: Self-pay | Admitting: Family Medicine

## 2016-04-05 NOTE — Telephone Encounter (Signed)
Taken care of

## 2016-04-05 NOTE — Telephone Encounter (Signed)
Pt says that she was speaking with someone in regards to a document being filled out. Pt says that the person wanted to know how did she want to go about receiving it. Pt would like to have it mailed to her home if possible.

## 2016-04-18 ENCOUNTER — Other Ambulatory Visit: Payer: Self-pay | Admitting: Family Medicine

## 2016-04-18 MED ORDER — DESOGESTREL-ETHINYL ESTRADIOL 0.15-0.02/0.01 MG (21/5) PO TABS: 1.0000 | ORAL_TABLET | Freq: Every day | ORAL | Status: DC

## 2016-04-19 ENCOUNTER — Telehealth: Payer: Self-pay | Admitting: *Deleted

## 2016-04-19 NOTE — Telephone Encounter (Signed)
Received fax from [facility] that pt has been approved for prescription drug assistance for Vyvanse capsules (30 caps per month) effective through 04/18/2017  [employer] case #: [id number]  Approval letter sent for scanning

## 2016-04-22 ENCOUNTER — Encounter: Payer: Self-pay | Admitting: Family Medicine

## 2016-04-22 ENCOUNTER — Ambulatory Visit (INDEPENDENT_AMBULATORY_CARE_PROVIDER_SITE_OTHER): Payer: BLUE CROSS/BLUE SHIELD | Admitting: Family Medicine

## 2016-04-22 ENCOUNTER — Encounter: Payer: BLUE CROSS/BLUE SHIELD | Admitting: Family Medicine

## 2016-04-22 VITALS — BP 108/68 | HR 73 | Temp 98.4°F | Ht 65.0 in | Wt 204.5 lb

## 2016-04-22 DIAGNOSIS — N760 Acute vaginitis: Secondary | ICD-10-CM

## 2016-04-22 DIAGNOSIS — E782 Mixed hyperlipidemia: Secondary | ICD-10-CM

## 2016-04-22 DIAGNOSIS — Z0000 Encounter for general adult medical examination without abnormal findings: Secondary | ICD-10-CM | POA: Diagnosis not present

## 2016-04-22 DIAGNOSIS — E663 Overweight: Secondary | ICD-10-CM

## 2016-04-22 DIAGNOSIS — Z124 Encounter for screening for malignant neoplasm of cervix: Secondary | ICD-10-CM

## 2016-04-22 DIAGNOSIS — L708 Other acne: Secondary | ICD-10-CM

## 2016-04-22 MED ORDER — FLUCONAZOLE 150 MG PO TABS: 150.0000 mg | ORAL_TABLET | ORAL | Status: DC

## 2016-04-22 MED ORDER — LISDEXAMFETAMINE DIMESYLATE 60 MG PO CAPS: 60.0000 mg | ORAL_CAPSULE | ORAL | Status: DC

## 2016-04-22 NOTE — Assessment & Plan Note (Signed)
Refill Diflucan today

## 2016-04-22 NOTE — Patient Instructions (Signed)
Preventive Care for Adults, Female A healthy lifestyle and preventive care can promote health and wellness. Preventive health guidelines for women include the following key practices.  A routine yearly physical is a good way to check with your health care provider about your health and preventive screening. It is a chance to share any concerns and updates on your health and to receive a thorough exam.  Visit your dentist for a routine exam and preventive care every 6 months. Brush your teeth twice a day and floss once a day. Good oral hygiene prevents tooth decay and gum disease.  The frequency of eye exams is based on your age, health, family medical history, use of contact lenses, and other factors. Follow your health care provider's recommendations for frequency of eye exams.  Eat a healthy diet. Foods like vegetables, fruits, whole grains, low-fat dairy products, and lean protein foods contain the nutrients you need without too many calories. Decrease your intake of foods high in solid fats, added sugars, and salt. Eat the right amount of calories for you.Get information about a proper diet from your health care provider, if necessary.  Regular physical exercise is one of the most important things you can do for your health. Most adults should get at least 150 minutes of moderate-intensity exercise (any activity that increases your heart rate and causes you to sweat) each week. In addition, most adults need muscle-strengthening exercises on 2 or more days a week.  Maintain a healthy weight. The body mass index (BMI) is a screening tool to identify possible weight problems. It provides an estimate of body fat based on height and weight. Your health care provider can find your BMI and can help you achieve or maintain a healthy weight.For adults 20 years and older:  A BMI below 18.5 is considered underweight.  A BMI of 18.5 to 24.9 is normal.  A BMI of 25 to 29.9 is considered overweight.  A  BMI of 30 and above is considered obese.  Maintain normal blood lipids and cholesterol levels by exercising and minimizing your intake of saturated fat. Eat a balanced diet with plenty of fruit and vegetables. Blood tests for lipids and cholesterol should begin at age 45 and be repeated every 5 years. If your lipid or cholesterol levels are high, you are over 50, or you are at high risk for heart disease, you may need your cholesterol levels checked more frequently.Ongoing high lipid and cholesterol levels should be treated with medicines if diet and exercise are not working.  If you smoke, find out from your health care provider how to quit. If you do not use tobacco, do not start.  Lung cancer screening is recommended for adults aged 45-80 years who are at high risk for developing lung cancer because of a history of smoking. A yearly low-dose CT scan of the lungs is recommended for people who have at least a 30-pack-year history of smoking and are a current smoker or have quit within the past 15 years. A pack year of smoking is smoking an average of 1 pack of cigarettes a day for 1 year (for example: 1 pack a day for 30 years or 2 packs a day for 15 years). Yearly screening should continue until the smoker has stopped smoking for at least 15 years. Yearly screening should be stopped for people who develop a health problem that would prevent them from having lung cancer treatment.  If you are pregnant, do not drink alcohol. If you are  breastfeeding, be very cautious about drinking alcohol. If you are not pregnant and choose to drink alcohol, do not have more than 1 drink per day. One drink is considered to be 12 ounces (355 mL) of beer, 5 ounces (148 mL) of wine, or 1.5 ounces (44 mL) of liquor.  Avoid use of street drugs. Do not share needles with anyone. Ask for help if you need support or instructions about stopping the use of drugs.  High blood pressure causes heart disease and increases the risk  of stroke. Your blood pressure should be checked at least every 1 to 2 years. Ongoing high blood pressure should be treated with medicines if weight loss and exercise do not work.  If you are 55-79 years old, ask your health care provider if you should take aspirin to prevent strokes.  Diabetes screening is done by taking a blood sample to check your blood glucose level after you have not eaten for a certain period of time (fasting). If you are not overweight and you do not have risk factors for diabetes, you should be screened once every 3 years starting at age 45. If you are overweight or obese and you are 40-70 years of age, you should be screened for diabetes every year as part of your cardiovascular risk assessment.  Breast cancer screening is essential preventive care for women. You should practice "breast self-awareness." This means understanding the normal appearance and feel of your breasts and may include breast self-examination. Any changes detected, no matter how small, should be reported to a health care provider. Women in their 20s and 30s should have a clinical breast exam (CBE) by a health care provider as part of a regular health exam every 1 to 3 years. After age 40, women should have a CBE every year. Starting at age 40, women should consider having a mammogram (breast X-ray test) every year. Women who have a family history of breast cancer should talk to their health care provider about genetic screening. Women at a high risk of breast cancer should talk to their health care providers about having an MRI and a mammogram every year.  Breast cancer gene (BRCA)-related cancer risk assessment is recommended for women who have family members with BRCA-related cancers. BRCA-related cancers include breast, ovarian, tubal, and peritoneal cancers. Having family members with these cancers may be associated with an increased risk for harmful changes (mutations) in the breast cancer genes BRCA1 and  BRCA2. Results of the assessment will determine the need for genetic counseling and BRCA1 and BRCA2 testing.  Your health care provider may recommend that you be screened regularly for cancer of the pelvic organs (ovaries, uterus, and vagina). This screening involves a pelvic examination, including checking for microscopic changes to the surface of your cervix (Pap test). You may be encouraged to have this screening done every 3 years, beginning at age 21.  For women ages 30-65, health care providers may recommend pelvic exams and Pap testing every 3 years, or they may recommend the Pap and pelvic exam, combined with testing for human papilloma virus (HPV), every 5 years. Some types of HPV increase your risk of cervical cancer. Testing for HPV may also be done on women of any age with unclear Pap test results.  Other health care providers may not recommend any screening for nonpregnant women who are considered low risk for pelvic cancer and who do not have symptoms. Ask your health care provider if a screening pelvic exam is right for   you.  If you have had past treatment for cervical cancer or a condition that could lead to cancer, you need Pap tests and screening for cancer for at least 20 years after your treatment. If Pap tests have been discontinued, your risk factors (such as having a new sexual partner) need to be reassessed to determine if screening should resume. Some women have medical problems that increase the chance of getting cervical cancer. In these cases, your health care provider may recommend more frequent screening and Pap tests.  Colorectal cancer can be detected and often prevented. Most routine colorectal cancer screening begins at the age of 50 years and continues through age 75 years. However, your health care provider may recommend screening at an earlier age if you have risk factors for colon cancer. On a yearly basis, your health care provider may provide home test kits to check  for hidden blood in the stool. Use of a small camera at the end of a tube, to directly examine the colon (sigmoidoscopy or colonoscopy), can detect the earliest forms of colorectal cancer. Talk to your health care provider about this at age 50, when routine screening begins. Direct exam of the colon should be repeated every 5-10 years through age 75 years, unless early forms of precancerous polyps or small growths are found.  People who are at an increased risk for hepatitis B should be screened for this virus. You are considered at high risk for hepatitis B if:  You were born in a country where hepatitis B occurs often. Talk with your health care provider about which countries are considered high risk.  Your parents were born in a high-risk country and you have not received a shot to protect against hepatitis B (hepatitis B vaccine).  You have HIV or AIDS.  You use needles to inject street drugs.  You live with, or have sex with, someone who has hepatitis B.  You get hemodialysis treatment.  You take certain medicines for conditions like cancer, organ transplantation, and autoimmune conditions.  Hepatitis C blood testing is recommended for all people born from 1945 through 1965 and any individual with known risks for hepatitis C.  Practice safe sex. Use condoms and avoid high-risk sexual practices to reduce the spread of sexually transmitted infections (STIs). STIs include gonorrhea, chlamydia, syphilis, trichomonas, herpes, HPV, and human immunodeficiency virus (HIV). Herpes, HIV, and HPV are viral illnesses that have no cure. They can result in disability, cancer, and death.  You should be screened for sexually transmitted illnesses (STIs) including gonorrhea and chlamydia if:  You are sexually active and are younger than 24 years.  You are older than 24 years and your health care provider tells you that you are at risk for this type of infection.  Your sexual activity has changed  since you were last screened and you are at an increased risk for chlamydia or gonorrhea. Ask your health care provider if you are at risk.  If you are at risk of being infected with HIV, it is recommended that you take a prescription medicine daily to prevent HIV infection. This is called preexposure prophylaxis (PrEP). You are considered at risk if:  You are sexually active and do not regularly use condoms or know the HIV status of your partner(s).  You take drugs by injection.  You are sexually active with a partner who has HIV.  Talk with your health care provider about whether you are at high risk of being infected with HIV. If   you choose to begin PrEP, you should first be tested for HIV. You should then be tested every 3 months for as long as you are taking PrEP.  Osteoporosis is a disease in which the bones lose minerals and strength with aging. This can result in serious bone fractures or breaks. The risk of osteoporosis can be identified using a bone density scan. Women ages 67 years and over and women at risk for fractures or osteoporosis should discuss screening with their health care providers. Ask your health care provider whether you should take a calcium supplement or vitamin D to reduce the rate of osteoporosis.  Menopause can be associated with physical symptoms and risks. Hormone replacement therapy is available to decrease symptoms and risks. You should talk to your health care provider about whether hormone replacement therapy is right for you.  Use sunscreen. Apply sunscreen liberally and repeatedly throughout the day. You should seek shade when your shadow is shorter than you. Protect yourself by wearing long sleeves, pants, a wide-brimmed hat, and sunglasses year round, whenever you are outdoors.  Once a month, do a whole body skin exam, using a mirror to look at the skin on your back. Tell your health care provider of new moles, moles that have irregular borders, moles that  are larger than a pencil eraser, or moles that have changed in shape or color.  Stay current with required vaccines (immunizations).  Influenza vaccine. All adults should be immunized every year.  Tetanus, diphtheria, and acellular pertussis (Td, Tdap) vaccine. Pregnant women should receive 1 dose of Tdap vaccine during each pregnancy. The dose should be obtained regardless of the length of time since the last dose. Immunization is preferred during the 27th-36th week of gestation. An adult who has not previously received Tdap or who does not know her vaccine status should receive 1 dose of Tdap. This initial dose should be followed by tetanus and diphtheria toxoids (Td) booster doses every 10 years. Adults with an unknown or incomplete history of completing a 3-dose immunization series with Td-containing vaccines should begin or complete a primary immunization series including a Tdap dose. Adults should receive a Td booster every 10 years.  Varicella vaccine. An adult without evidence of immunity to varicella should receive 2 doses or a second dose if she has previously received 1 dose. Pregnant females who do not have evidence of immunity should receive the first dose after pregnancy. This first dose should be obtained before leaving the health care facility. The second dose should be obtained 4-8 weeks after the first dose.  Human papillomavirus (HPV) vaccine. Females aged 13-26 years who have not received the vaccine previously should obtain the 3-dose series. The vaccine is not recommended for use in pregnant females. However, pregnancy testing is not needed before receiving a dose. If a female is found to be pregnant after receiving a dose, no treatment is needed. In that case, the remaining doses should be delayed until after the pregnancy. Immunization is recommended for any person with an immunocompromised condition through the age of 61 years if she did not get any or all doses earlier. During the  3-dose series, the second dose should be obtained 4-8 weeks after the first dose. The third dose should be obtained 24 weeks after the first dose and 16 weeks after the second dose.  Zoster vaccine. One dose is recommended for adults aged 30 years or older unless certain conditions are present.  Measles, mumps, and rubella (MMR) vaccine. Adults born  before 1957 generally are considered immune to measles and mumps. Adults born in 1957 or later should have 1 or more doses of MMR vaccine unless there is a contraindication to the vaccine or there is laboratory evidence of immunity to each of the three diseases. A routine second dose of MMR vaccine should be obtained at least 28 days after the first dose for students attending postsecondary schools, health care workers, or international travelers. People who received inactivated measles vaccine or an unknown type of measles vaccine during 1963-1967 should receive 2 doses of MMR vaccine. People who received inactivated mumps vaccine or an unknown type of mumps vaccine before 1979 and are at high risk for mumps infection should consider immunization with 2 doses of MMR vaccine. For females of childbearing age, rubella immunity should be determined. If there is no evidence of immunity, females who are not pregnant should be vaccinated. If there is no evidence of immunity, females who are pregnant should delay immunization until after pregnancy. Unvaccinated health care workers born before 1957 who lack laboratory evidence of measles, mumps, or rubella immunity or laboratory confirmation of disease should consider measles and mumps immunization with 2 doses of MMR vaccine or rubella immunization with 1 dose of MMR vaccine.  Pneumococcal 13-valent conjugate (PCV13) vaccine. When indicated, a person who is uncertain of his immunization history and has no record of immunization should receive the PCV13 vaccine. All adults 65 years of age and older should receive this  vaccine. An adult aged 19 years or older who has certain medical conditions and has not been previously immunized should receive 1 dose of PCV13 vaccine. This PCV13 should be followed with a dose of pneumococcal polysaccharide (PPSV23) vaccine. Adults who are at high risk for pneumococcal disease should obtain the PPSV23 vaccine at least 8 weeks after the dose of PCV13 vaccine. Adults older than 29 years of age who have normal immune system function should obtain the PPSV23 vaccine dose at least 1 year after the dose of PCV13 vaccine.  Pneumococcal polysaccharide (PPSV23) vaccine. When PCV13 is also indicated, PCV13 should be obtained first. All adults aged 65 years and older should be immunized. An adult younger than age 65 years who has certain medical conditions should be immunized. Any person who resides in a nursing home or long-term care facility should be immunized. An adult smoker should be immunized. People with an immunocompromised condition and certain other conditions should receive both PCV13 and PPSV23 vaccines. People with human immunodeficiency virus (HIV) infection should be immunized as soon as possible after diagnosis. Immunization during chemotherapy or radiation therapy should be avoided. Routine use of PPSV23 vaccine is not recommended for American Indians, [place] Natives, or people younger than 65 years unless there are medical conditions that require PPSV23 vaccine. When indicated, people who have unknown immunization and have no record of immunization should receive PPSV23 vaccine. One-time revaccination 5 years after the first dose of PPSV23 is recommended for people aged 19-64 years who have chronic kidney failure, nephrotic syndrome, asplenia, or immunocompromised conditions. People who received 1-2 doses of PPSV23 before age 65 years should receive another dose of PPSV23 vaccine at age 65 years or later if at least 5 years have passed since the previous dose. Doses of PPSV23 are not  needed for people immunized with PPSV23 at or after age 65 years.  Meningococcal vaccine. Adults with asplenia or persistent complement component deficiencies should receive 2 doses of quadrivalent meningococcal conjugate (MenACWY-D) vaccine. The doses should be obtained   at least 2 months apart. Microbiologists working with certain meningococcal bacteria, [place] recruits, people at risk during an outbreak, and people who travel to or live in countries with a high rate of meningitis should be immunized. A first-year college student up through age 34 years who is living in a residence hall should receive a dose if she did not receive a dose on or after her 16th birthday. Adults who have certain high-risk conditions should receive one or more doses of vaccine.  Hepatitis A vaccine. Adults who wish to be protected from this disease, have certain high-risk conditions, work with hepatitis A-infected animals, work in hepatitis A research labs, or travel to or work in countries with a high rate of hepatitis A should be immunized. Adults who were previously unvaccinated and who anticipate close contact with an international adoptee during the first 60 days after arrival in the [place] from a country with a high rate of hepatitis A should be immunized.  Hepatitis B vaccine. Adults who wish to be protected from this disease, have certain high-risk conditions, may be exposed to blood or other infectious body fluids, are household contacts or sex partners of hepatitis B positive people, are clients or workers in certain care facilities, or travel to or work in countries with a high rate of hepatitis B should be immunized.  Haemophilus influenzae type b (Hib) vaccine. A previously unvaccinated person with asplenia or sickle cell disease or having a scheduled splenectomy should receive 1 dose of Hib vaccine. Regardless of previous immunization, a recipient of a hematopoietic stem cell transplant should receive a  3-dose series 6-12 months after her successful transplant. Hib vaccine is not recommended for adults with HIV infection. Preventive Services / Frequency Ages 35 to 4 years  Blood pressure check.** / Every 3-5 years.  Lipid and cholesterol check.** / Every 5 years beginning at age 60.  Clinical breast exam.** / Every 3 years for women in their 71s and 10s.  BRCA-related cancer risk assessment.** / For women who have family members with a BRCA-related cancer (breast, ovarian, tubal, or peritoneal cancers).  Pap test.** / Every 2 years from ages 76 through 26. Every 3 years starting at age 61 through age 76 or 93 with a history of 3 consecutive normal Pap tests.  HPV screening.** / Every 3 years from ages 37 through ages 60 to 51 with a history of 3 consecutive normal Pap tests.  Hepatitis C blood test.** / For any individual with known risks for hepatitis C.  Skin self-exam. / Monthly.  Influenza vaccine. / Every year.  Tetanus, diphtheria, and acellular pertussis (Tdap, Td) vaccine.** / Consult your health care provider. Pregnant women should receive 1 dose of Tdap vaccine during each pregnancy. 1 dose of Td every 10 years.  Varicella vaccine.** / Consult your health care provider. Pregnant females who do not have evidence of immunity should receive the first dose after pregnancy.  HPV vaccine. / 3 doses over 6 months, if 93 and younger. The vaccine is not recommended for use in pregnant females. However, pregnancy testing is not needed before receiving a dose.  Measles, mumps, rubella (MMR) vaccine.** / You need at least 1 dose of MMR if you were born in 1957 or later. You may also need a 2nd dose. For females of childbearing age, rubella immunity should be determined. If there is no evidence of immunity, females who are not pregnant should be vaccinated. If there is no evidence of immunity, females who are  pregnant should delay immunization until after pregnancy.  Pneumococcal  13-valent conjugate (PCV13) vaccine.** / Consult your health care provider.  Pneumococcal polysaccharide (PPSV23) vaccine.** / 1 to 2 doses if you smoke cigarettes or if you have certain conditions.  Meningococcal vaccine.** / 1 dose if you are age 68 to 8 years and a Market researcher living in a residence hall, or have one of several medical conditions, you need to get vaccinated against meningococcal disease. You may also need additional booster doses.  Hepatitis A vaccine.** / Consult your health care provider.  Hepatitis B vaccine.** / Consult your health care provider.  Haemophilus influenzae type b (Hib) vaccine.** / Consult your health care provider. Ages 7 to 53 years  Blood pressure check.** / Every year.  Lipid and cholesterol check.** / Every 5 years beginning at age 25 years.  Lung cancer screening. / Every year if you are aged 11-80 years and have a 30-pack-year history of smoking and currently smoke or have quit within the past 15 years. Yearly screening is stopped once you have quit smoking for at least 15 years or develop a health problem that would prevent you from having lung cancer treatment.  Clinical breast exam.** / Every year after age 48 years.  BRCA-related cancer risk assessment.** / For women who have family members with a BRCA-related cancer (breast, ovarian, tubal, or peritoneal cancers).  Mammogram.** / Every year beginning at age 41 years and continuing for as long as you are in good health. Consult with your health care provider.  Pap test.** / Every 3 years starting at age 65 years through age 37 or 70 years with a history of 3 consecutive normal Pap tests.  HPV screening.** / Every 3 years from ages 72 years through ages 60 to 40 years with a history of 3 consecutive normal Pap tests.  Fecal occult blood test (FOBT) of stool. / Every year beginning at age 21 years and continuing until age 5 years. You may not need to do this test if you get  a colonoscopy every 10 years.  Flexible sigmoidoscopy or colonoscopy.** / Every 5 years for a flexible sigmoidoscopy or every 10 years for a colonoscopy beginning at age 35 years and continuing until age 48 years.  Hepatitis C blood test.** / For all people born from [birth year] through 1965 and any individual with known risks for hepatitis C.  Skin self-exam. / Monthly.  Influenza vaccine. / Every year.  Tetanus, diphtheria, and acellular pertussis (Tdap/Td) vaccine.** / Consult your health care provider. Pregnant women should receive 1 dose of Tdap vaccine during each pregnancy. 1 dose of Td every 10 years.  Varicella vaccine.** / Consult your health care provider. Pregnant females who do not have evidence of immunity should receive the first dose after pregnancy.  Zoster vaccine.** / 1 dose for adults aged 30 years or older.  Measles, mumps, rubella (MMR) vaccine.** / You need at least 1 dose of MMR if you were born in 1957 or later. You may also need a second dose. For females of childbearing age, rubella immunity should be determined. If there is no evidence of immunity, females who are not pregnant should be vaccinated. If there is no evidence of immunity, females who are pregnant should delay immunization until after pregnancy.  Pneumococcal 13-valent conjugate (PCV13) vaccine.** / Consult your health care provider.  Pneumococcal polysaccharide (PPSV23) vaccine.** / 1 to 2 doses if you smoke cigarettes or if you have certain conditions.  Meningococcal vaccine.** /  Consult your health care provider.  Hepatitis A vaccine.** / Consult your health care provider.  Hepatitis B vaccine.** / Consult your health care provider.  Haemophilus influenzae type b (Hib) vaccine.** / Consult your health care provider. Ages 64 years and over  Blood pressure check.** / Every year.  Lipid and cholesterol check.** / Every 5 years beginning at age 23 years.  Lung cancer screening. / Every year if you  are aged 16-80 years and have a 30-pack-year history of smoking and currently smoke or have quit within the past 15 years. Yearly screening is stopped once you have quit smoking for at least 15 years or develop a health problem that would prevent you from having lung cancer treatment.  Clinical breast exam.** / Every year after age 74 years.  BRCA-related cancer risk assessment.** / For women who have family members with a BRCA-related cancer (breast, ovarian, tubal, or peritoneal cancers).  Mammogram.** / Every year beginning at age 44 years and continuing for as long as you are in good health. Consult with your health care provider.  Pap test.** / Every 3 years starting at age 58 years through age 22 or 39 years with 3 consecutive normal Pap tests. Testing can be stopped between 65 and 70 years with 3 consecutive normal Pap tests and no abnormal Pap or HPV tests in the past 10 years.  HPV screening.** / Every 3 years from ages 64 years through ages 70 or 61 years with a history of 3 consecutive normal Pap tests. Testing can be stopped between 65 and 70 years with 3 consecutive normal Pap tests and no abnormal Pap or HPV tests in the past 10 years.  Fecal occult blood test (FOBT) of stool. / Every year beginning at age 40 years and continuing until age 27 years. You may not need to do this test if you get a colonoscopy every 10 years.  Flexible sigmoidoscopy or colonoscopy.** / Every 5 years for a flexible sigmoidoscopy or every 10 years for a colonoscopy beginning at age 7 years and continuing until age 32 years.  Hepatitis C blood test.** / For all people born from [birth year] through 1964 and any individual with known risks for hepatitis C.  Osteoporosis screening.** / A one-time screening for women ages 30 years and over and women at risk for fractures or osteoporosis.  Skin self-exam. / Monthly.  Influenza vaccine. / Every year.  Tetanus, diphtheria, and acellular pertussis (Tdap/Td)  vaccine.** / 1 dose of Td every 10 years.  Varicella vaccine.** / Consult your health care provider.  Zoster vaccine.** / 1 dose for adults aged 35 years or older.  Pneumococcal 13-valent conjugate (PCV13) vaccine.** / Consult your health care provider.  Pneumococcal polysaccharide (PPSV23) vaccine.** / 1 dose for all adults aged 46 years and older.  Meningococcal vaccine.** / Consult your health care provider.  Hepatitis A vaccine.** / Consult your health care provider.  Hepatitis B vaccine.** / Consult your health care provider.  Haemophilus influenzae type b (Hib) vaccine.** / Consult your health care provider. ** Family history and personal history of risk and conditions may change your health care provider's recommendations.   This information is not intended to replace advice given to you by your health care provider. Make sure you discuss any questions you have with your health care provider.   Document Released: 11/26/2001 Document Revised: 10/21/2014 Document Reviewed: 02/25/2011 Elsevier Interactive Patient Education [employer].

## 2016-04-22 NOTE — Assessment & Plan Note (Signed)
Encouraged DASH diet, decrease po intake and increase exercise as tolerated. Needs 7-8 hours of sleep nightly. Avoid trans fats, eat small, frequent meals every 4-5 hours with lean proteins, complex carbs and healthy fats. Minimize simple carbs 

## 2016-04-22 NOTE — Assessment & Plan Note (Addendum)
Improved with Minerva Fester

## 2016-04-22 NOTE — Progress Notes (Signed)
Patient ID: Bonnie Lewis, female   DOB: 1986-09-09, 30 y.o.   MRN: [id number]   Subjective:    Patient ID: Bonnie Lewis, female    DOB: 1986/05/14, 30 y.o.   MRN: [id number]  Chief Complaint  Patient presents with  . Annual Exam    HPI Patient is in today for annual preventative exam. No recent illness or acute concerns. She does continue to have intermittent vaginal discharge. No significant trouble today. She is helping to care for her father who has early onset dementia so she is under a great deal of stress. Most days she feels she is handling the stress adequately. Denies CP/palp/SOB/HA/congestion/fevers/GI or GU c/o. Taking meds as prescribed  Past Medical History:  Diagnosis Date  . Acne 04/14/2015  . ADD (attention deficit disorder)   . Anemia 09/12/2011  . Bronchitis 08/11/2011  . Cervical cancer ([place]) 10/19/2012  . Cervical cancer screening 10/19/2012  . Dyslipidemia 10/20/2012  . Dysmenorrhea 10/20/2012  . Insomnia 09/12/2011  . LLQ pain 08/11/2011  . Migraine 05/14/2013  . Preventative health care 09/16/2011  . Seasonal affective disorder ([place]) 10/19/2012  . Vaginitis 10/19/2012  . Viral pharyngitis 01/10/2016    No past surgical history on file.  Family History  Problem Relation Age of Onset  . Asthma Father   . Depression Father     and anxiety  . Allergies Father   . Irritable bowel syndrome Father   . Dementia Father   . Depression Sister   . Irritable bowel syndrome Sister   . Migraines Sister   . Depression Brother   . Allergies Brother   . Depression Maternal Grandmother     Anxiety  . Hypertension Maternal Grandmother   . ADD / ADHD Maternal Grandmother   . Menstrual problems Maternal Grandmother     heavy bleeding requiring hysterectomy  . Depression Paternal Grandmother   . Cardiomyopathy Paternal Grandmother   . Menstrual problems Paternal Grandmother   . Hyperlipidemia Mother   . Anemia Sister   . Coronary artery disease Paternal Grandfather   . Cancer  Paternal Aunt     Social History   Social History  . Marital status: Single    Spouse name: N/A  . Number of children: N/A  . Years of education: N/A   Occupational History  . Not on file.   Social History Main Topics  . Smoking status: Never Smoker  . Smokeless tobacco: Never Used  . Alcohol use No  . Drug use: No  . Sexual activity: No     Comment: lives at home to help her mother with her father who has early onset Alzheimer's   Other Topics Concern  . Not on file   Social History Narrative  . No narrative on file    Outpatient Medications Prior to Visit  Medication Sig Dispense Refill  . acyclovir (ZOVIRAX) 400 MG tablet Take 2 tablets (800 mg total) by mouth 3 (three) times daily. 12 tablet 3  . citalopram (CELEXA) 20 MG tablet Take 1 tablet (20 mg total) by mouth daily. 90 tablet 1  . desogestrel-ethinyl estradiol (KARIVA) 0.15-0.02/0.01 MG (21/5) tablet Take 1 tablet by mouth daily. 1 Package 11  . methocarbamol (ROBAXIN) 500 MG tablet Take 1 tablet (500 mg total) by mouth 2 (two) times daily. 60 tablet 2  . rizatriptan (MAXALT) 10 MG tablet TAKE 1 TABLET (10 MG TOTAL) BY MOUTH AS NEEDED FOR MIGRAINE. MAY REPEAT IN 2 HOURS IF NEEDED 10 tablet 1  . HYDROcodone-homatropine (  HYCODAN) 5-1.5 MG/5ML syrup Take 5 mLs by mouth every 8 (eight) hours as needed for cough. 180 mL 0  . lisdexamfetamine (VYVANSE) 60 MG capsule Take 1 capsule (60 mg total) by mouth every morning. June 2017 30 capsule 0  . lisdexamfetamine (VYVANSE) 60 MG capsule Take 1 capsule (60 mg total) by mouth every morning. May  2017 30 capsule 0  . lisdexamfetamine (VYVANSE) 60 MG capsule Take 1 capsule (60 mg total) by mouth every morning. July 2017 30 capsule 0   Facility-Administered Medications Prior to Visit  Medication Dose Route Frequency Provider Last Rate Last Dose  . methylPREDNISolone acetate (DEPO-MEDROL) injection 40 mg  40 mg Intramuscular Once Mosie Lukes, MD        Allergies  Allergen  Reactions  . Sulfonamide Derivatives     Review of Systems  Constitutional: Negative for fever, chills and malaise/fatigue.  HENT: Negative for congestion and hearing loss.   Eyes: Negative for discharge.  Respiratory: Negative for cough, sputum production and shortness of breath.   Cardiovascular: Negative for chest pain, palpitations and leg swelling.  Gastrointestinal: Negative for heartburn, nausea, vomiting, abdominal pain, diarrhea, constipation and blood in stool.  Genitourinary: Negative for dysuria, urgency, frequency and hematuria.  Musculoskeletal: Positive for back pain. Negative for falls and myalgias.  Skin: Negative for rash.  Neurological: Negative for dizziness, sensory change, loss of consciousness, weakness and headaches.  Endo/Heme/Allergies: Negative for environmental allergies. Does not bruise/bleed easily.  Psychiatric/Behavioral: Negative for depression and suicidal ideas. The patient is not nervous/anxious and does not have insomnia.        Objective:    Physical Exam  Constitutional: She is oriented to person, place, and time. She appears well-developed and well-nourished. No distress.  HENT:  Head: Normocephalic and atraumatic.  Eyes: Conjunctivae are normal.  Neck: Neck supple. No thyromegaly present.  Cardiovascular: Normal rate, regular rhythm and normal heart sounds.   No murmur heard. Pulmonary/Chest: Effort normal and breath sounds normal. No respiratory distress.  Abdominal: Soft. Bowel sounds are normal. She exhibits no distension and no mass. There is no tenderness.  Musculoskeletal: She exhibits no edema.  Lymphadenopathy:    She has no cervical adenopathy.  Neurological: She is alert and oriented to person, place, and time.  Skin: Skin is warm and dry.  Psychiatric: She has a normal mood and affect. Her behavior is normal.    BP 108/68   Pulse 73   Temp 98.4 F (36.9 C) (Oral)   Ht 5\' 5"  (1.651 m)   Wt 204 lb 8 oz (92.8 kg)   SpO2  97%   BMI 34.03 kg/m  Wt Readings from Last 3 Encounters:  04/22/16 204 lb 8 oz (92.8 kg)  01/29/16 211 lb (95.7 kg)  01/09/16 207 lb 6 oz (94.1 kg)     Lab Results  Component Value Date   WBC 7.7 05/05/2013   HGB 11.5 (L) 05/05/2013   HCT 33.9 (L) 05/05/2013   PLT 288.0 05/05/2013   GLUCOSE 78 05/05/2013   CHOL 177 05/05/2013   TRIG 38.0 05/05/2013   HDL 73.90 05/05/2013   LDLDIRECT 110.1 10/19/2012   LDLCALC 96 05/05/2013   ALT 19 05/05/2013   AST 21 05/05/2013   NA 137 05/05/2013   K 3.8 05/05/2013   CL 104 05/05/2013   CREATININE 0.8 05/05/2013   BUN 16 05/05/2013   CO2 26 05/05/2013   TSH 1.79 05/05/2013    Lab Results  Component Value Date   TSH  1.79 05/05/2013   Lab Results  Component Value Date   WBC 7.7 05/05/2013   HGB 11.5 (L) 05/05/2013   HCT 33.9 (L) 05/05/2013   MCV 87.7 05/05/2013   PLT 288.0 05/05/2013   Lab Results  Component Value Date   NA 137 05/05/2013   K 3.8 05/05/2013   CO2 26 05/05/2013   GLUCOSE 78 05/05/2013   BUN 16 05/05/2013   CREATININE 0.8 05/05/2013   BILITOT 0.5 05/05/2013   ALKPHOS 45 05/05/2013   AST 21 05/05/2013   ALT 19 05/05/2013   PROT 6.3 05/05/2013   ALBUMIN 3.9 05/05/2013   ALBUMIN 3.9 05/05/2013   CALCIUM 9.1 05/05/2013   GFR 92.20 05/05/2013   Lab Results  Component Value Date   CHOL 177 05/05/2013   Lab Results  Component Value Date   HDL 73.90 05/05/2013   Lab Results  Component Value Date   LDLCALC 96 05/05/2013   Lab Results  Component Value Date   TRIG 38.0 05/05/2013   Lab Results  Component Value Date   CHOLHDL 2 05/05/2013   No results found for: HGBA1C     Assessment & Plan:   Problem List Items Addressed This Visit    Mixed hyperlipidemia    Encouraged heart healthy diet, increase exercise, avoid trans fats, consider a krill oil cap daily      Overweight    Encouraged DASH diet, decrease po intake and increase exercise as tolerated. Needs 7-8 hours of sleep nightly.  Avoid trans fats, eat small, frequent meals every 4-5 hours with lean proteins, complex carbs and healthy fats. Minimize simple carbs      ACNE VULGARIS    Improved with Minerva Fester      Preventative health care - Primary    Patient encouraged to maintain heart healthy diet, regular exercise, adequate sleep. Consider daily probiotics. Take medications as prescribed. Given and reviewed copy of ACP documents from [place] of State and encouraged to complete and return      Cervical cancer screening    doing well, patient has never been sexually active in past will wait on pap for now.       Vaginitis    Refill Diflucan today       Other Visit Diagnoses   None.     I have discontinued Ms. Fregeau's HYDROcodone-homatropine. I have also changed her lisdexamfetamine, lisdexamfetamine, and lisdexamfetamine. Additionally, I am having her start on fluconazole. Lastly, I am having her maintain her rizatriptan, methocarbamol, citalopram, acyclovir, and desogestrel-ethinyl estradiol. We will continue to administer methylPREDNISolone acetate.  Meds ordered this encounter  Medications  . lisdexamfetamine (VYVANSE) 60 MG capsule    Sig: Take 1 capsule (60 mg total) by mouth every morning. October  2017    Dispense:  30 capsule    Refill:  0  . lisdexamfetamine (VYVANSE) 60 MG capsule    Sig: Take 1 capsule (60 mg total) by mouth every morning. September  2017    Dispense:  30 capsule    Refill:  0  . lisdexamfetamine (VYVANSE) 60 MG capsule    Sig: Take 1 capsule (60 mg total) by mouth every morning. August 2017    Dispense:  30 capsule    Refill:  0  . fluconazole (DIFLUCAN) 150 MG tablet    Sig: Take 1 tablet (150 mg total) by mouth once a week.    Dispense:  2 tablet    Refill:  1     Penni Homans, MD

## 2016-04-22 NOTE — Progress Notes (Signed)
Pre visit review using our clinic review tool, if applicable. No additional management support is needed unless otherwise documented below in the visit note. 

## 2016-04-22 NOTE — Assessment & Plan Note (Signed)
Patient encouraged to maintain heart healthy diet, regular exercise, adequate sleep. Consider daily probiotics. Take medications as prescribed. Given and reviewed copy of ACP documents from [place] Secretary of State and encouraged to complete and return 

## 2016-04-22 NOTE — Assessment & Plan Note (Signed)
doing well, patient has never been sexually active in past will wait on pap for now.

## 2016-05-05 NOTE — Assessment & Plan Note (Signed)
Encouraged heart healthy diet, increase exercise, avoid trans fats, consider a krill oil cap daily 

## 2016-08-18 ENCOUNTER — Encounter: Payer: Self-pay | Admitting: Family Medicine

## 2016-08-18 ENCOUNTER — Other Ambulatory Visit: Payer: Self-pay | Admitting: Family Medicine

## 2016-08-19 MED ORDER — LISDEXAMFETAMINE DIMESYLATE 60 MG PO CAPS
60.0000 mg | ORAL_CAPSULE | ORAL | 0 refills | Status: DC
Start: 2016-08-19 — End: 2016-10-24

## 2016-08-19 MED ORDER — LISDEXAMFETAMINE DIMESYLATE 60 MG PO CAPS: 60.0000 mg | ORAL_CAPSULE | ORAL | 0 refills | Status: DC

## 2016-08-19 NOTE — Telephone Encounter (Signed)
Printed 3 months of vyvanse. Patient aware to pickup at her convenience,.

## 2016-10-16 ENCOUNTER — Other Ambulatory Visit: Payer: Self-pay | Admitting: Family Medicine

## 2016-10-16 DIAGNOSIS — F329 Major depressive disorder, single episode, unspecified: Secondary | ICD-10-CM

## 2016-10-16 DIAGNOSIS — F32A Depression, unspecified: Secondary | ICD-10-CM

## 2016-10-24 ENCOUNTER — Ambulatory Visit (INDEPENDENT_AMBULATORY_CARE_PROVIDER_SITE_OTHER): Payer: BLUE CROSS/BLUE SHIELD | Admitting: Family Medicine

## 2016-10-24 ENCOUNTER — Encounter: Payer: Self-pay | Admitting: Family Medicine

## 2016-10-24 DIAGNOSIS — E663 Overweight: Secondary | ICD-10-CM

## 2016-10-24 DIAGNOSIS — E782 Mixed hyperlipidemia: Secondary | ICD-10-CM

## 2016-10-24 DIAGNOSIS — J209 Acute bronchitis, unspecified: Secondary | ICD-10-CM | POA: Diagnosis not present

## 2016-10-24 DIAGNOSIS — F988 Other specified behavioral and emotional disorders with onset usually occurring in childhood and adolescence: Secondary | ICD-10-CM | POA: Diagnosis not present

## 2016-10-24 MED ORDER — LISDEXAMFETAMINE DIMESYLATE 60 MG PO CAPS: 60.0000 mg | ORAL_CAPSULE | ORAL | 0 refills | Status: DC

## 2016-10-24 MED ORDER — FLUCONAZOLE 150 MG PO TABS: 150.0000 mg | ORAL_TABLET | ORAL | 0 refills | Status: DC

## 2016-10-24 MED ORDER — VALACYCLOVIR HCL 1 G PO TABS: 2000.0000 mg | ORAL_TABLET | Freq: Two times a day (BID) | ORAL | 1 refills | Status: DC

## 2016-10-24 MED ORDER — AMOXICILLIN 500 MG PO CAPS: 500.0000 mg | ORAL_CAPSULE | Freq: Three times a day (TID) | ORAL | 0 refills | Status: DC

## 2016-10-24 MED FILL — valACYclovir HCL 1 GM TABS: 1 | 1 days supply | Qty: 4 | Fill #0

## 2016-10-24 MED FILL — FLUCONAZOLE 150 MG TABLET: 150 | 14 days supply | Qty: 2 | Fill #0

## 2016-10-24 MED FILL — AMOXICILLIN 500 MG CAPSULE: 500 | 10 days supply | Qty: 30 | Fill #0

## 2016-10-24 NOTE — Assessment & Plan Note (Signed)
Encouraged heart healthy diet, increase exercise, avoid trans fats, consider a krill oil cap daily 

## 2016-10-24 NOTE — Assessment & Plan Note (Signed)
She is doing well on Vyvanse, given refills today

## 2016-10-24 NOTE — Progress Notes (Signed)
Subjective:    Patient ID: Bonnie Lewis, female    DOB: 06-14-1986, 31 y.o.   MRN: [id number]  Chief Complaint  Patient presents with  . Follow-up    HPI Patient is in today for follow up. She is noting over a week of worsening respiratory symptoms. She has a cough and chest congestion. Her cough is productive of Dark green phlegm. No obvious fevers or chills but she does endorse nasal congestion and malaise. Prior to this she had been feeling well. She is in a new relationship and this has helped her anxiety and depression Denies CP/palp/SOB/HA/fevers/GI or GU c/o. Taking meds as prescribed  Past Medical History:  Diagnosis Date  . Acne 04/14/2015  . ADD (attention deficit disorder)   . Anemia 09/12/2011  . Bronchitis 08/11/2011  . Cervical cancer ([place]) 10/19/2012  . Cervical cancer screening 10/19/2012  . Dyslipidemia 10/20/2012  . Dysmenorrhea 10/20/2012  . Insomnia 09/12/2011  . LLQ pain 08/11/2011  . Migraine 05/14/2013  . Preventative health care 09/16/2011  . Seasonal affective disorder ([place]) 10/19/2012  . Vaginitis 10/19/2012  . Viral pharyngitis 01/10/2016    No past surgical history on file.  Family History  Problem Relation Age of Onset  . Asthma Father   . Depression Father     and anxiety  . Allergies Father   . Irritable bowel syndrome Father   . Dementia Father   . Depression Sister   . Irritable bowel syndrome Sister   . Migraines Sister   . Depression Brother   . Allergies Brother   . Depression Maternal Grandmother     Anxiety  . Hypertension Maternal Grandmother   . ADD / ADHD Maternal Grandmother   . Menstrual problems Maternal Grandmother     heavy bleeding requiring hysterectomy  . Depression Paternal Grandmother   . Cardiomyopathy Paternal Grandmother   . Menstrual problems Paternal Grandmother   . Hyperlipidemia Mother   . Anemia Sister   . Coronary artery disease Paternal Grandfather   . Cancer Paternal Aunt     Social History   Social History    . Marital status: Single    Spouse name: N/A  . Number of children: N/A  . Years of education: N/A   Occupational History  . Not on file.   Social History Main Topics  . Smoking status: Never Smoker  . Smokeless tobacco: Never Used  . Alcohol use No  . Drug use: No  . Sexual activity: No     Comment: lives at home to help her mother with her father who has early onset Alzheimer's   Other Topics Concern  . Not on file   Social History Narrative  . No narrative on file    Outpatient Medications Prior to Visit  Medication Sig Dispense Refill  . citalopram (CELEXA) 20 MG tablet TAKE 1 TABLET (20 MG TOTAL) BY MOUTH DAILY. 90 tablet 1  . desogestrel-ethinyl estradiol (KARIVA) 0.15-0.02/0.01 MG (21/5) tablet Take 1 tablet by mouth daily. 1 Package 11  . methocarbamol (ROBAXIN) 500 MG tablet Take 1 tablet (500 mg total) by mouth 2 (two) times daily. 60 tablet 2  . rizatriptan (MAXALT) 10 MG tablet TAKE 1 TABLET (10 MG TOTAL) BY MOUTH AS NEEDED FOR MIGRAINE. MAY REPEAT IN 2 HOURS IF NEEDED 10 tablet 1  . acyclovir (ZOVIRAX) 400 MG tablet Take 2 tablets (800 mg total) by mouth 3 (three) times daily. 12 tablet 3  . fluconazole (DIFLUCAN) 150 MG tablet Take 1 tablet (  150 mg total) by mouth once a week. 2 tablet 1  . lisdexamfetamine (VYVANSE) 60 MG capsule Take 1 capsule (60 mg total) by mouth every morning. November   2017 30 capsule 0  . lisdexamfetamine (VYVANSE) 60 MG capsule Take 1 capsule (60 mg total) by mouth every morning. December  2017 30 capsule 0  . lisdexamfetamine (VYVANSE) 60 MG capsule Take 1 capsule (60 mg total) by mouth every morning. January 2018 30 capsule 0   Facility-Administered Medications Prior to Visit  Medication Dose Route Frequency Provider Last Rate Last Dose  . methylPREDNISolone acetate (DEPO-MEDROL) injection 40 mg  40 mg Intramuscular Once Mosie Lukes, MD        Allergies  Allergen Reactions  . Sulfonamide Derivatives     Review of Systems   Constitutional: Positive for malaise/fatigue. Negative for fever.  HENT: Positive for congestion.   Eyes: Negative for blurred vision.  Respiratory: Positive for cough and sputum production. Negative for shortness of breath.   Cardiovascular: Negative for chest pain, palpitations and leg swelling.  Gastrointestinal: Negative for vomiting.  Musculoskeletal: Negative for back pain.  Skin: Negative for rash.  Neurological: Negative for loss of consciousness and headaches.       Objective:    Physical Exam  Constitutional: She is oriented to person, place, and time. She appears well-developed and well-nourished. No distress.  HENT:  Head: Normocephalic and atraumatic.  Nasal mucosa boggy and erythematous.   Eyes: Conjunctivae are normal.  Neck: Normal range of motion. No thyromegaly present.  Cardiovascular: Normal rate and regular rhythm.   Pulmonary/Chest: Effort normal and breath sounds normal. She has no wheezes.  Abdominal: Soft. Bowel sounds are normal. There is no tenderness.  Musculoskeletal: Normal range of motion. She exhibits no edema or deformity.  Lymphadenopathy:    She has cervical adenopathy.  Neurological: She is alert and oriented to person, place, and time.  Skin: Skin is warm and dry. She is not diaphoretic.  Psychiatric: She has a normal mood and affect.    BP 118/68 (BP Location: Left Arm, Patient Position: Sitting, Cuff Size: Normal)   Pulse 79   Temp 98.4 F (36.9 C) (Oral)   Wt 211 lb (95.7 kg)   LMP 10/24/2016 (Approximate)   SpO2 98%   BMI 35.11 kg/m  Wt Readings from Last 3 Encounters:  10/24/16 211 lb (95.7 kg)  04/22/16 204 lb 8 oz (92.8 kg)  01/29/16 211 lb (95.7 kg)     Lab Results  Component Value Date   WBC 7.7 05/05/2013   HGB 11.5 (L) 05/05/2013   HCT 33.9 (L) 05/05/2013   PLT 288.0 05/05/2013   GLUCOSE 78 05/05/2013   CHOL 177 05/05/2013   TRIG 38.0 05/05/2013   HDL 73.90 05/05/2013   LDLDIRECT 110.1 10/19/2012   LDLCALC 96  05/05/2013   ALT 19 05/05/2013   AST 21 05/05/2013   NA 137 05/05/2013   K 3.8 05/05/2013   CL 104 05/05/2013   CREATININE 0.8 05/05/2013   BUN 16 05/05/2013   CO2 26 05/05/2013   TSH 1.79 05/05/2013    Lab Results  Component Value Date   TSH 1.79 05/05/2013   Lab Results  Component Value Date   WBC 7.7 05/05/2013   HGB 11.5 (L) 05/05/2013   HCT 33.9 (L) 05/05/2013   MCV 87.7 05/05/2013   PLT 288.0 05/05/2013   Lab Results  Component Value Date   NA 137 05/05/2013   K 3.8 05/05/2013   CO2  26 05/05/2013   GLUCOSE 78 05/05/2013   BUN 16 05/05/2013   CREATININE 0.8 05/05/2013   BILITOT 0.5 05/05/2013   ALKPHOS 45 05/05/2013   AST 21 05/05/2013   ALT 19 05/05/2013   PROT 6.3 05/05/2013   ALBUMIN 3.9 05/05/2013   ALBUMIN 3.9 05/05/2013   CALCIUM 9.1 05/05/2013   GFR 92.20 05/05/2013   Lab Results  Component Value Date   CHOL 177 05/05/2013   Lab Results  Component Value Date   HDL 73.90 05/05/2013   Lab Results  Component Value Date   LDLCALC 96 05/05/2013   Lab Results  Component Value Date   TRIG 38.0 05/05/2013   Lab Results  Component Value Date   CHOLHDL 2 05/05/2013   No results found for: HGBA1C     Assessment & Plan:   Problem List Items Addressed This Visit    Mixed hyperlipidemia    Encouraged heart healthy diet, increase exercise, avoid trans fats, consider a krill oil cap daily      Overweight    Encouraged DASH diet, decrease po intake and increase exercise as tolerated. Needs 7-8 hours of sleep nightly. Avoid trans fats, eat small, frequent meals every 4-5 hours with lean proteins, complex carbs and healthy fats. Minimize simple carbs      ADD (attention deficit disorder)    She is doing well on Vyvanse, given refills today      Acute bronchitis    Encouraged increased rest and hydration, add probiotics, zinc such as Coldeze or Xicam. Treat fevers as needed. Amoxicillin given if symptoms do not resolve         I have  discontinued Ms. Demonte's acyclovir and fluconazole. I have also changed her lisdexamfetamine, lisdexamfetamine, and lisdexamfetamine. Additionally, I am having her start on amoxicillin, fluconazole, and valACYclovir. Lastly, I am having her maintain her rizatriptan, methocarbamol, desogestrel-ethinyl estradiol, and citalopram. We will continue to administer methylPREDNISolone acetate.  Meds ordered this encounter  Medications  . lisdexamfetamine (VYVANSE) 60 MG capsule    Sig: Take 1 capsule (60 mg total) by mouth every morning. February 2018    Dispense:  30 capsule    Refill:  0  . lisdexamfetamine (VYVANSE) 60 MG capsule    Sig: Take 1 capsule (60 mg total) by mouth every morning. March 2018    Dispense:  30 capsule    Refill:  0  . lisdexamfetamine (VYVANSE) 60 MG capsule    Sig: Take 1 capsule (60 mg total) by mouth every morning. April 2018    Dispense:  30 capsule    Refill:  0  . amoxicillin (AMOXIL) 500 MG capsule    Sig: Take 1 capsule (500 mg total) by mouth 3 (three) times daily.    Dispense:  30 capsule    Refill:  0  . fluconazole (DIFLUCAN) 150 MG tablet    Sig: Take 1 tablet (150 mg total) by mouth once a week.    Dispense:  2 tablet    Refill:  0  . valACYclovir (VALTREX) 1000 MG tablet    Sig: Take 2 tablets (2,000 mg total) by mouth 2 (two) times daily.    Dispense:  4 tablet    Refill:  1    CMA served as scribe during this visit. History, Physical and Plan performed by medical provider. Documentation and orders reviewed and attested to.  Penni Homans, MD

## 2016-10-24 NOTE — Progress Notes (Signed)
Pre visit review using our clinic review tool, if applicable. No additional management support is needed unless otherwise documented below in the visit note. 

## 2016-10-24 NOTE — Assessment & Plan Note (Signed)
Encouraged increased rest and hydration, add probiotics, zinc such as Coldeze or Xicam. Treat fevers as needed. Amoxicillin given if symptoms do not resolve

## 2016-10-24 NOTE — Patient Instructions (Signed)
Encouraged increased rest and hydration, add probiotics, zinc such as Coldeze or Xicam. Treat fevers as needed. Increase fluids to 64 OZ +, plain Mucinex, vitamin C 500 to 1000 mg daily, Elderberry, aged or black garlic, hydrate, humidifier Acute Bronchitis, Adult Acute bronchitis is when air tubes (bronchi) in the lungs suddenly get swollen. The condition can make it hard to breathe. It can also cause these symptoms:  A cough.  Coughing up clear, yellow, or green mucus.  Wheezing.  Chest congestion.  Shortness of breath.  A fever.  Body aches.  Chills.  A sore throat. Follow these instructions at home: Medicines  Take over-the-counter and prescription medicines only as told by your doctor.  If you were prescribed an antibiotic medicine, take it as told by your doctor. Do not stop taking the antibiotic even if you start to feel better. General instructions  Rest.  Drink enough fluids to keep your pee (urine) clear or pale yellow.  Avoid smoking and secondhand smoke. If you smoke and you need help quitting, ask your doctor. Quitting will help your lungs heal faster.  Use an inhaler, cool mist vaporizer, or humidifier as told by your doctor.  Keep all follow-up visits as told by your doctor. This is important. How is this prevented? To lower your risk of getting this condition again:  Wash your hands often with soap and water. If you cannot use soap and water, use hand sanitizer.  Avoid contact with people who have cold symptoms.  Try not to touch your hands to your mouth, nose, or eyes.  Make sure to get the flu shot every year. Contact a doctor if:  Your symptoms do not get better in 2 weeks. Get help right away if:  You cough up blood.  You have chest pain.  You have very bad shortness of breath.  You become dehydrated.  You faint (pass out) or keep feeling like you are going to pass out.  You keep throwing up (vomiting).  You have a very bad  headache.  Your fever or chills gets worse. This information is not intended to replace advice given to you by your health care provider. Make sure you discuss any questions you have with your health care provider. Document Released: 03/18/2008 Document Revised: 05/08/2016 Document Reviewed: 03/20/2016 Elsevier Interactive Patient Education  2017 [employer].

## 2016-10-28 NOTE — Assessment & Plan Note (Signed)
Encouraged DASH diet, decrease po intake and increase exercise as tolerated. Needs 7-8 hours of sleep nightly. Avoid trans fats, eat small, frequent meals every 4-5 hours with lean proteins, complex carbs and healthy fats. Minimize simple carbs 

## 2016-12-19 ENCOUNTER — Telehealth: Payer: Self-pay

## 2016-12-19 NOTE — Telephone Encounter (Signed)
PA initiated via Covermymeds; KEY: [id number]. Awaiting determination.

## 2016-12-20 NOTE — Telephone Encounter (Signed)
Received PA denial. Pt must try at least one formulary alternative before Vyvanse can be covered (did not see where Pt has tried any other medications other than Vyvanse). Formulary alternatives are: Adderall XR, Concerta, Dexmethylphenidate HCL ER capsules.

## 2016-12-22 NOTE — Telephone Encounter (Signed)
I thought she had tried something years ago before I saw, please check with her, if not warn her we will have to try a new med to get them to approve the Vyvanse.

## 2016-12-23 NOTE — Telephone Encounter (Signed)
Called the patient left message on her cell/home number to call back.

## 2016-12-24 NOTE — Telephone Encounter (Signed)
Called the patients cell/home number left message to call back.

## 2016-12-25 NOTE — Telephone Encounter (Signed)
Called  Cell number left message to call back. Spoke to the mom and the pharmacy made a mistake vyvanse did not have to have a PA Patient has her medication.

## 2016-12-26 ENCOUNTER — Ambulatory Visit: Payer: BLUE CROSS/BLUE SHIELD | Admitting: Family Medicine

## 2017-02-14 ENCOUNTER — Other Ambulatory Visit: Payer: Self-pay | Admitting: Family Medicine

## 2017-02-14 MED ORDER — AMOXICILLIN 500 MG PO CAPS: 500.0000 mg | ORAL_CAPSULE | Freq: Three times a day (TID) | ORAL | 0 refills | Status: DC

## 2017-02-14 MED ORDER — LISDEXAMFETAMINE DIMESYLATE 60 MG PO CAPS: 60.0000 mg | ORAL_CAPSULE | ORAL | 0 refills | Status: DC

## 2017-02-14 MED ORDER — FLUCONAZOLE 150 MG PO TABS: 150.0000 mg | ORAL_TABLET | ORAL | 0 refills | Status: DC

## 2017-02-14 MED ORDER — LISDEXAMFETAMINE DIMESYLATE 60 MG PO CAPS
60.0000 mg | ORAL_CAPSULE | ORAL | 0 refills | Status: DC
Start: 2017-02-14 — End: 2017-04-10

## 2017-02-14 NOTE — Telephone Encounter (Signed)
Medications sent  In to [facility] on [address] She will pickup hardcopy's at the front for vyvanse.

## 2017-03-14 ENCOUNTER — Telehealth: Payer: Self-pay

## 2017-03-14 NOTE — Telephone Encounter (Signed)
Received application from [facility] for patient to receive assistance with Vyvanse medication via fax.  Provider portion is completed and ready for pick up.  Pt portion still needs to be filled out.  Called patient to discuss.  No answer.  Left a message for call back.

## 2017-03-21 ENCOUNTER — Other Ambulatory Visit: Payer: Self-pay | Admitting: Family Medicine

## 2017-03-26 ENCOUNTER — Encounter: Payer: Self-pay | Admitting: Family Medicine

## 2017-04-07 ENCOUNTER — Other Ambulatory Visit: Payer: Self-pay | Admitting: Family Medicine

## 2017-04-07 DIAGNOSIS — F32A Depression, unspecified: Secondary | ICD-10-CM

## 2017-04-07 DIAGNOSIS — F329 Major depressive disorder, single episode, unspecified: Secondary | ICD-10-CM

## 2017-04-10 ENCOUNTER — Other Ambulatory Visit (HOSPITAL_COMMUNITY)
Admission: RE | Admit: 2017-04-10 | Discharge: 2017-04-10 | Disposition: A | Discharge status: Home / Self Care | Payer: BLUE CROSS/BLUE SHIELD | Source: Ambulatory Visit | Attending: Family Medicine | Admitting: Family Medicine

## 2017-04-10 ENCOUNTER — Encounter: Payer: Self-pay | Admitting: Family Medicine

## 2017-04-10 ENCOUNTER — Ambulatory Visit (INDEPENDENT_AMBULATORY_CARE_PROVIDER_SITE_OTHER): Payer: BLUE CROSS/BLUE SHIELD | Admitting: Family Medicine

## 2017-04-10 VITALS — BP 118/72 | HR 71 | Temp 98.4°F | Resp 14 | Ht 65.0 in | Wt 219.5 lb

## 2017-04-10 DIAGNOSIS — F339 Major depressive disorder, recurrent, unspecified: Secondary | ICD-10-CM

## 2017-04-10 DIAGNOSIS — F988 Other specified behavioral and emotional disorders with onset usually occurring in childhood and adolescence: Secondary | ICD-10-CM | POA: Diagnosis not present

## 2017-04-10 DIAGNOSIS — E669 Obesity, unspecified: Secondary | ICD-10-CM

## 2017-04-10 DIAGNOSIS — Z0000 Encounter for general adult medical examination without abnormal findings: Secondary | ICD-10-CM

## 2017-04-10 DIAGNOSIS — E782 Mixed hyperlipidemia: Secondary | ICD-10-CM

## 2017-04-10 DIAGNOSIS — Z124 Encounter for screening for malignant neoplasm of cervix: Secondary | ICD-10-CM | POA: Diagnosis not present

## 2017-04-10 DIAGNOSIS — Z01419 Encounter for gynecological examination (general) (routine) without abnormal findings: Secondary | ICD-10-CM | POA: Diagnosis not present

## 2017-04-10 DIAGNOSIS — F338 Other recurrent depressive disorders: Secondary | ICD-10-CM

## 2017-04-10 LAB — LIPID PANEL
CHOL/HDL RATIO: 3
CHOLESTEROL: 171 mg/dL (ref 0–200)
HDL: 58.6 mg/dL (ref >39.00)
LDL CALC: 94 mg/dL (ref 0–99)
NonHDL: 112.26
TRIGLYCERIDES: 90 mg/dL (ref 0.0–149.0)
VLDL: 18 mg/dL (ref 0.0–40.0)

## 2017-04-10 LAB — COMPREHENSIVE METABOLIC PANEL
ALBUMIN: 4.3 g/dL (ref 3.5–5.2)
ALT: 36 U/L — ABNORMAL HIGH (ref 0–35)
AST: 28 U/L (ref 0–37)
Alkaline Phosphatase: 53 U/L (ref 39–117)
BUN: 15 mg/dL (ref 6–23)
CHLORIDE: 105 meq/L (ref 96–112)
CO2: 23 meq/L (ref 19–32)
CREATININE: 0.86 mg/dL (ref 0.40–1.20)
Calcium: 9.2 mg/dL (ref 8.4–10.5)
GFR: 82.42 mL/min (ref >60.00)
Glucose, Bld: 99 mg/dL (ref 70–99)
Potassium: 4.4 mEq/L (ref 3.5–5.1)
Sodium: 137 mEq/L (ref 135–145)
Total Bilirubin: 0.3 mg/dL (ref 0.2–1.2)
Total Protein: 6.9 g/dL (ref 6.0–8.3)

## 2017-04-10 LAB — CBC
HCT: 37.2 % (ref 36.0–46.0)
Hemoglobin: 12.5 g/dL (ref 12.0–15.0)
MCHC: 33.5 g/dL (ref 30.0–36.0)
MCV: 88.5 fl (ref 78.0–100.0)
PLATELETS: 327 10*3/uL (ref 150.0–400.0)
RBC: 4.2 Mil/uL (ref 3.87–5.11)
RDW: 14 % (ref 11.5–15.5)
WBC: 6.9 10*3/uL (ref 4.0–10.5)

## 2017-04-10 LAB — TSH: TSH: 2.61 u[IU]/mL (ref 0.35–4.50)

## 2017-04-10 MED ORDER — LISDEXAMFETAMINE DIMESYLATE 60 MG PO CAPS: 60.0000 mg | ORAL_CAPSULE | ORAL | 0 refills | Status: DC

## 2017-04-10 NOTE — Assessment & Plan Note (Signed)
Pap today, no concerns on exam.  

## 2017-04-10 NOTE — Assessment & Plan Note (Signed)
Encouraged DASH diet, decrease po intake and increase exercise as tolerated. Needs 7-8 hours of sleep nightly. Avoid trans fats, eat small, frequent meals every 4-5 hours with lean proteins, complex carbs and healthy fats. Minimize simple carbs, given bariatric referral

## 2017-04-10 NOTE — Patient Instructions (Signed)
Preventive Care 18-39 Years, Female Preventive care refers to lifestyle choices and visits with your health care provider that can promote health and wellness. What does preventive care include?  A yearly physical exam. This is also called an annual well check.  Dental exams once or twice a year.  Routine eye exams. Ask your health care provider how often you should have your eyes checked.  Personal lifestyle choices, including: ? Daily care of your teeth and gums. ? Regular physical activity. ? Eating a healthy diet. ? Avoiding tobacco and drug use. ? Limiting alcohol use. ? Practicing safe sex. ? Taking vitamin and mineral supplements as recommended by your health care provider. What happens during an annual well check? The services and screenings done by your health care provider during your annual well check will depend on your age, overall health, lifestyle risk factors, and family history of disease. Counseling Your health care provider may ask you questions about your:  Alcohol use.  Tobacco use.  Drug use.  Emotional well-being.  Home and relationship well-being.  Sexual activity.  Eating habits.  Work and work Statistician.  Method of birth control.  Menstrual cycle.  Pregnancy history.  Screening You may have the following tests or measurements:  Height, weight, and BMI.  Diabetes screening. This is done by checking your blood sugar (glucose) after you have not eaten for a while (fasting).  Blood pressure.  Lipid and cholesterol levels. These may be checked every 5 years starting at age 66.  Skin check.  Hepatitis C blood test.  Hepatitis B blood test.  Sexually transmitted disease (STD) testing.  BRCA-related cancer screening. This may be done if you have a family history of breast, ovarian, tubal, or peritoneal cancers.  Pelvic exam and Pap test. This may be done every 3 years starting at age 40. Starting at age 59, this may be done every 5  years if you have a Pap test in combination with an HPV test.  Discuss your test results, treatment options, and if necessary, the need for more tests with your health care provider. Vaccines Your health care provider may recommend certain vaccines, such as:  Influenza vaccine. This is recommended every year.  Tetanus, diphtheria, and acellular pertussis (Tdap, Td) vaccine. You may need a Td booster every 10 years.  Varicella vaccine. You may need this if you have not been vaccinated.  HPV vaccine. If you are 69 or younger, you may need three doses over 6 months.  Measles, mumps, and rubella (MMR) vaccine. You may need at least one dose of MMR. You may also need a second dose.  Pneumococcal 13-valent conjugate (PCV13) vaccine. You may need this if you have certain conditions and were not previously vaccinated.  Pneumococcal polysaccharide (PPSV23) vaccine. You may need one or two doses if you smoke cigarettes or if you have certain conditions.  Meningococcal vaccine. One dose is recommended if you are age 27-21 years and a first-year college student living in a residence hall, or if you have one of several medical conditions. You may also need additional booster doses.  Hepatitis A vaccine. You may need this if you have certain conditions or if you travel or work in places where you may be exposed to hepatitis A.  Hepatitis B vaccine. You may need this if you have certain conditions or if you travel or work in places where you may be exposed to hepatitis B.  Haemophilus influenzae type b (Hib) vaccine. You may need this if  you have certain risk factors.  Talk to your health care provider about which screenings and vaccines you need and how often you need them. This information is not intended to replace advice given to you by your health care provider. Make sure you discuss any questions you have with your health care provider. Document Released: 11/26/2001 Document Revised: 06/19/2016  Document Reviewed: 08/01/2015 Elsevier Interactive Patient Education  2017 [employer].

## 2017-04-10 NOTE — Assessment & Plan Note (Signed)
Patient encouraged to maintain heart healthy diet, regular exercise, adequate sleep. Consider daily probiotics. Take medications as prescribed 

## 2017-04-10 NOTE — Progress Notes (Signed)
Subjective:  I acted as a Education administrator for Dr. Charlett Blake. Bonnie Lewis, [place]  Patient ID: Bonnie Lewis, female    DOB: 1986/06/01, 31 y.o.   MRN: [id number]  Chief Complaint  Patient presents with  . Annual Exam    paperwork for shirecares    HPI  Patient is in today for an annual exam. She is struggling with the recent loss of her father to early dementia. The end was very stressful but she and her family are getting through together. No suicidal ideation but she has had some anhedonia. Denies CP/palp/SOB/HA/congestion/fevers/GI or GU c/o. Taking meds as prescribed. No GYN concerns.   Patient Care Team: Mosie Lukes, MD as PCP - General   Past Medical History:  Diagnosis Date  . Acne 04/14/2015  . ADD (attention deficit disorder)   . Anemia 09/12/2011  . Bronchitis 08/11/2011  . Cervical cancer ([place]) 10/19/2012  . Cervical cancer screening 10/19/2012  . Dyslipidemia 10/20/2012  . Dysmenorrhea 10/20/2012  . Insomnia 09/12/2011  . LLQ pain 08/11/2011  . Migraine 05/14/2013  . Obesity 06/27/2010   Qualifier: Diagnosis of  By: Domingo Cocking RMA, Christy    . Preventative health care 09/16/2011  . Seasonal affective disorder ([place]) 10/19/2012  . Vaginitis 10/19/2012  . Viral pharyngitis 01/10/2016    No past surgical history on file.  Family History  Problem Relation Age of Onset  . Asthma Father   . Depression Father        and anxiety  . Allergies Father   . Irritable bowel syndrome Father   . Dementia Father   . Depression Sister   . Irritable bowel syndrome Sister   . Migraines Sister   . Depression Brother   . Allergies Brother   . Depression Maternal Grandmother        Anxiety  . Hypertension Maternal Grandmother   . ADD / ADHD Maternal Grandmother   . Menstrual problems Maternal Grandmother        heavy bleeding requiring hysterectomy  . Depression Paternal Grandmother   . Cardiomyopathy Paternal Grandmother   . Menstrual problems Paternal Grandmother   . Hyperlipidemia Mother   . Anemia  Sister   . Coronary artery disease Paternal Grandfather   . Cancer Paternal Aunt     Social History   Social History  . Marital status: Single    Spouse name: N/A  . Number of children: N/A  . Years of education: N/A   Occupational History  . Not on file.   Social History Main Topics  . Smoking status: Never Smoker  . Smokeless tobacco: Never Used  . Alcohol use No  . Drug use: No  . Sexual activity: No     Comment: lives at home to help her mother with her father who has early onset Alzheimer's   Other Topics Concern  . Not on file   Social History Narrative  . No narrative on file    Outpatient Medications Prior to Visit  Medication Sig Dispense Refill  . citalopram (CELEXA) 20 MG tablet TAKE 1 TABLET (20 MG TOTAL) BY MOUTH DAILY. 90 tablet 1  . rizatriptan (MAXALT) 10 MG tablet TAKE 1 TABLET (10 MG TOTAL) BY MOUTH AS NEEDED FOR MIGRAINE. MAY REPEAT IN 2 HOURS IF NEEDED 10 tablet 1  . valACYclovir (VALTREX) 1000 MG tablet Take 2 tablets (2,000 mg total) by mouth 2 (two) times daily. 4 tablet 1  . VIORELE 0.15-0.02/0.01 MG (21/5) tablet TAKE 1 TABLET BY MOUTH ONCE DAILY 28  tablet 11  . lisdexamfetamine (VYVANSE) 60 MG capsule Take 1 capsule (60 mg total) by mouth every morning. July  2018 30 capsule 0  . lisdexamfetamine (VYVANSE) 60 MG capsule Take 1 capsule (60 mg total) by mouth every morning. May 2018 30 capsule 0  . lisdexamfetamine (VYVANSE) 60 MG capsule Take 1 capsule (60 mg total) by mouth every morning. June 2018 30 capsule 0  . amoxicillin (AMOXIL) 500 MG capsule Take 1 capsule (500 mg total) by mouth 3 (three) times daily. (Patient not taking: Reported on 04/10/2017) 30 capsule 0  . fluconazole (DIFLUCAN) 150 MG tablet Take 1 tablet (150 mg total) by mouth once a week. (Patient not taking: Reported on 04/10/2017) 2 tablet 0  . methocarbamol (ROBAXIN) 500 MG tablet Take 1 tablet (500 mg total) by mouth 2 (two) times daily. (Patient not taking: Reported on  04/10/2017) 60 tablet 2  . methylPREDNISolone acetate (DEPO-MEDROL) injection 40 mg      No facility-administered medications prior to visit.     Allergies  Allergen Reactions  . Sulfonamide Derivatives     Review of Systems  Constitutional: Negative for fever and malaise/fatigue.  HENT: Negative for congestion.   Eyes: Negative for blurred vision.  Respiratory: Negative for cough and shortness of breath.   Cardiovascular: Negative for chest pain, palpitations and leg swelling.  Gastrointestinal: Negative for vomiting.  Musculoskeletal: Negative for back pain.  Skin: Negative for rash.  Neurological: Negative for loss of consciousness and headaches.  Psychiatric/Behavioral: Positive for depression. Negative for hallucinations, memory loss, substance abuse and suicidal ideas. The patient is nervous/anxious. The patient does not have insomnia.        Objective:    Physical Exam  Constitutional: She is oriented to person, place, and time. She appears well-developed and well-nourished. No distress.  HENT:  Head: Normocephalic and atraumatic.  Eyes: Conjunctivae are normal.  Neck: Normal range of motion. No thyromegaly present.  Cardiovascular: Normal rate and regular rhythm.   Pulmonary/Chest: Effort normal and breath sounds normal. She has no wheezes.  Abdominal: Soft. Bowel sounds are normal. There is no tenderness.  Genitourinary: Vagina normal and uterus normal. No vaginal discharge found.  Musculoskeletal: Normal range of motion. She exhibits no edema or deformity.  Neurological: She is alert and oriented to person, place, and time.  Skin: Skin is warm and dry. She is not diaphoretic.  Psychiatric: She has a normal mood and affect.    BP 118/72 (BP Location: Left Arm, Patient Position: Sitting, Cuff Size: Small)   Pulse 71   Temp 98.4 F (36.9 C) (Oral)   Resp 14   Ht 5\' 5"  (1.651 m)   Wt 219 lb 8 oz (99.6 kg)   SpO2 98%   BMI 36.53 kg/m  Wt Readings from Last 3  Encounters:  04/10/17 219 lb 8 oz (99.6 kg)  10/24/16 211 lb (95.7 kg)  04/22/16 204 lb 8 oz (92.8 kg)   BP Readings from Last 3 Encounters:  04/10/17 118/72  10/24/16 118/68  04/22/16 108/68     Immunization History  Administered Date(s) Administered  . HPV Quadrivalent 01/28/2011, 06/19/2011  . Hpv 12/19/2010  . Influenza Split 10/19/2012  . Influenza Whole 06/27/2010, 06/19/2011  . Influenza, Seasonal, Injecte, Preservative Fre 07/15/2015  . Influenza,inj,Quad PF,36+ Mos 06/09/2014  . Td 04/13/2010    Health Maintenance  Topic Date Due  . HIV Screening  05/27/2002  . INFLUENZA VACCINE  05/14/2017  . PAP SMEAR  04/10/2020  . TETANUS/TDAP  04/13/2020    Lab Results  Component Value Date   WBC 6.9 04/10/2017   HGB 12.5 04/10/2017   HCT 37.2 04/10/2017   PLT 327.0 04/10/2017   GLUCOSE 99 04/10/2017   CHOL 171 04/10/2017   TRIG 90.0 04/10/2017   HDL 58.60 04/10/2017   LDLDIRECT 110.1 10/19/2012   LDLCALC 94 04/10/2017   ALT 36 (H) 04/10/2017   AST 28 04/10/2017   NA 137 04/10/2017   K 4.4 04/10/2017   CL 105 04/10/2017   CREATININE 0.86 04/10/2017   BUN 15 04/10/2017   CO2 23 04/10/2017   TSH 2.61 04/10/2017    Lab Results  Component Value Date   TSH 2.61 04/10/2017   Lab Results  Component Value Date   WBC 6.9 04/10/2017   HGB 12.5 04/10/2017   HCT 37.2 04/10/2017   MCV 88.5 04/10/2017   PLT 327.0 04/10/2017   Lab Results  Component Value Date   NA 137 04/10/2017   K 4.4 04/10/2017   CO2 23 04/10/2017   GLUCOSE 99 04/10/2017   BUN 15 04/10/2017   CREATININE 0.86 04/10/2017   BILITOT 0.3 04/10/2017   ALKPHOS 53 04/10/2017   AST 28 04/10/2017   ALT 36 (H) 04/10/2017   PROT 6.9 04/10/2017   ALBUMIN 4.3 04/10/2017   CALCIUM 9.2 04/10/2017   GFR 82.42 04/10/2017   Lab Results  Component Value Date   CHOL 171 04/10/2017   Lab Results  Component Value Date   HDL 58.60 04/10/2017   Lab Results  Component Value Date   LDLCALC 94  04/10/2017   Lab Results  Component Value Date   TRIG 90.0 04/10/2017   Lab Results  Component Value Date   CHOLHDL 3 04/10/2017   No results found for: HGBA1C       Assessment & Plan:   Problem List Items Addressed This Visit    Mixed hyperlipidemia    Encouraged heart healthy diet, increase exercise, avoid trans fats, consider a krill oil cap daily      Relevant Orders   Lipid panel (Completed)   Obesity    Encouraged DASH diet, decrease po intake and increase exercise as tolerated. Needs 7-8 hours of sleep nightly. Avoid trans fats, eat small, frequent meals every 4-5 hours with lean proteins, complex carbs and healthy fats. Minimize simple carbs, given bariatric referral      Relevant Medications   lisdexamfetamine (VYVANSE) 60 MG capsule   lisdexamfetamine (VYVANSE) 60 MG capsule   lisdexamfetamine (VYVANSE) 60 MG capsule   Preventative health care - Primary    Patient encouraged to maintain heart healthy diet, regular exercise, adequate sleep. Consider daily probiotics. Take medications as prescribed      Relevant Orders   CBC (Completed)   Comprehensive metabolic panel (Completed)   Lipid panel (Completed)   TSH (Completed)   Seasonal affective disorder ([place])    Struggling with PTSD and grief secondary to the early death of her father from dementia, he was really struggling with anger and was difficult to manage. Consider increasing Citalopram to 30 mg daily. She is going to return to her counselor      Cervical cancer screening    Pap today, no concerns on exam.       Relevant Orders   Cytology - PAP (Completed)   ADD (attention deficit disorder)    Will refill Vyvanse and fill out paperwork for assistance         I have changed Ms. Llerenas's lisdexamfetamine, lisdexamfetamine,  and lisdexamfetamine. I am also having her maintain her rizatriptan, methocarbamol, valACYclovir, amoxicillin, fluconazole, VIORELE, and citalopram. We will stop administering  methylPREDNISolone acetate.  Meds ordered this encounter  Medications  . lisdexamfetamine (VYVANSE) 60 MG capsule    Sig: Take 1 capsule (60 mg total) by mouth every morning. August  2018    Dispense:  30 capsule    Refill:  0  . lisdexamfetamine (VYVANSE) 60 MG capsule    Sig: Take 1 capsule (60 mg total) by mouth every morning. September 2018    Dispense:  30 capsule    Refill:  0  . lisdexamfetamine (VYVANSE) 60 MG capsule    Sig: Take 1 capsule (60 mg total) by mouth every morning. October 2018    Dispense:  30 capsule    Refill:  0    CMA served as Education administrator during this visit. History, Physical and Plan performed by medical provider. Documentation and orders reviewed and attested to.  Penni Homans, MD

## 2017-04-10 NOTE — Assessment & Plan Note (Signed)
Will refill Vyvanse and fill out paperwork for assistance

## 2017-04-10 NOTE — Progress Notes (Deleted)
Pre visit review using our clinic review tool, if applicable. No additional management support is needed unless otherwise documented below in the visit note. 

## 2017-04-10 NOTE — Assessment & Plan Note (Signed)
Encouraged heart healthy diet, increase exercise, avoid trans fats, consider a krill oil cap daily 

## 2017-04-10 NOTE — Assessment & Plan Note (Signed)
Struggling with PTSD and grief secondary to the early death of her father from dementia, he was really struggling with anger and was difficult to manage. Consider increasing Citalopram to 30 mg daily. She is going to return to her counselor

## 2017-04-11 LAB — CYTOLOGY - PAP: Diagnosis: NEGATIVE

## 2017-04-14 ENCOUNTER — Telehealth: Payer: Self-pay | Admitting: *Deleted

## 2017-04-14 NOTE — Telephone Encounter (Signed)
Received letter from [facility] stating that patient assistance cannot be processed d/t needing physician signature and date; forwarded to provider's assistant/SLS 07/02

## 2017-04-21 ENCOUNTER — Telehealth: Payer: Self-pay | Admitting: *Deleted

## 2017-04-21 NOTE — Telephone Encounter (Signed)
Received Approval for patient assistance from [facility] on pt's Vyvanse, no cost through 04/19/18, forwarded to provider/SLS

## 2017-05-06 ENCOUNTER — Encounter: Payer: BLUE CROSS/BLUE SHIELD | Admitting: Family Medicine

## 2017-08-11 ENCOUNTER — Other Ambulatory Visit: Payer: Self-pay | Admitting: Family Medicine

## 2017-08-11 ENCOUNTER — Encounter: Payer: Self-pay | Admitting: Family Medicine

## 2017-08-12 NOTE — Telephone Encounter (Signed)
Patient called and stts she needs a a refill on Vyvanse. Patient wants VX by 08/13/17  [phone]

## 2017-08-13 MED ORDER — LISDEXAMFETAMINE DIMESYLATE 60 MG PO CAPS: 60.0000 mg | ORAL_CAPSULE | ORAL | 0 refills | Status: DC

## 2017-08-13 NOTE — Telephone Encounter (Signed)
Pt needs refill written for vyvanse 60 mg. She would like to pick up today if possible. Patient send a mychart. She has a f/u scheduled for Jan 2019.

## 2017-08-13 NOTE — Telephone Encounter (Signed)
Last Rx: 07/14/2017 UDS: 05/2015 Will obtain when pt picks up Rx  CSC: 04/10/2017 Last OV: 04/10/2017 Next OV:10/16/2017  Bonnie Lewis will you sign in PCP absence?

## 2017-08-14 ENCOUNTER — Ambulatory Visit (INDEPENDENT_AMBULATORY_CARE_PROVIDER_SITE_OTHER): Payer: BLUE CROSS/BLUE SHIELD | Admitting: Family Medicine

## 2017-08-14 ENCOUNTER — Encounter: Payer: Self-pay | Admitting: Family Medicine

## 2017-08-14 VITALS — BP 122/88 | HR 82 | Temp 98.6°F | Ht 64.0 in | Wt 223.0 lb

## 2017-08-14 DIAGNOSIS — M542 Cervicalgia: Secondary | ICD-10-CM

## 2017-08-14 DIAGNOSIS — Z23 Encounter for immunization: Secondary | ICD-10-CM

## 2017-08-14 MED ORDER — LISDEXAMFETAMINE DIMESYLATE 60 MG PO CAPS: 60.0000 mg | ORAL_CAPSULE | ORAL | 0 refills | Status: DC

## 2017-08-14 MED ORDER — DESOGESTREL-ETHINYL ESTRADIOL 0.15-0.02/0.01 MG (21/5) PO TABS: 1.0000 | ORAL_TABLET | Freq: Every day | ORAL | 3 refills | Status: DC

## 2017-08-14 MED ORDER — VALACYCLOVIR HCL 1 G PO TABS: 2000.0000 mg | ORAL_TABLET | Freq: Two times a day (BID) | ORAL | 1 refills | Status: DC

## 2017-08-14 MED ORDER — METHOCARBAMOL 500 MG PO TABS: 500.0000 mg | ORAL_TABLET | Freq: Two times a day (BID) | ORAL | 0 refills | Status: DC

## 2017-08-14 MED ORDER — FLUCONAZOLE 150 MG PO TABS: 150.0000 mg | ORAL_TABLET | ORAL | 0 refills | Status: DC

## 2017-08-14 MED ORDER — MELOXICAM 15 MG PO TABS: 15.0000 mg | ORAL_TABLET | Freq: Every day | ORAL | 0 refills | Status: DC

## 2017-08-14 MED ORDER — KETOROLAC TROMETHAMINE 60 MG/2ML IM SOLN
60.0000 mg | Freq: Once | INTRAMUSCULAR | Status: AC
  Administered 2017-08-14: 60 mg via INTRAMUSCULAR

## 2017-08-14 NOTE — Progress Notes (Signed)
Chief Complaint  Patient presents with  . Neck Pain    Subjective: Patient is a 31 y.o. female here for neck/upper back pain.  This is been going on for 3 days.  She sits in a position where she looks down for much of the day.  She also had done some heavy lifting leading into her pain.  She had a leftover Robaxin that she took which was mildly helpful.  Did not make her drowsy.  She denies any numbness, tingling, or weakness.  ROS: MSK: +Neck and upper back TTP  Past Medical History:  Diagnosis Date  . Acne 04/14/2015  . ADD (attention deficit disorder)   . Anemia 09/12/2011  . Bronchitis 08/11/2011  . Cervical cancer ([place]) 10/19/2012  . Cervical cancer screening 10/19/2012  . Dyslipidemia 10/20/2012  . Dysmenorrhea 10/20/2012  . Insomnia 09/12/2011  . LLQ pain 08/11/2011  . Migraine 05/14/2013  . Obesity 06/27/2010   Qualifier: Diagnosis of  By: Domingo Cocking RMA, Christy    . Preventative health care 09/16/2011  . Seasonal affective disorder ([place]) 10/19/2012  . Vaginitis 10/19/2012  . Viral pharyngitis 01/10/2016   Objective: BP 122/88 (BP Location: Left Arm, Patient Position: Sitting, Cuff Size: Large)   Pulse 82   Temp 98.6 F (37 C) (Oral)   Ht 5\' 4"  (1.626 m)   Wt 223 lb (101.2 kg)   SpO2 98%   BMI 38.28 kg/m  General: Awake, appears stated age Heart: Brisk capillary refill Lungs: No accessory muscle use MSK: 5/5 strength throughout; +TTP over cervical and upper thoracic paraspinal msc b/l Neuro: No cerebellar signs, negative Spurling's, DTRs equal and symmetric in upper extremities bilaterally Psych: Age appropriate judgment and insight, normal affect and mood  PROCEDURE NOTE After discussing the procedure and risks, including but not limited to increased pain or stiffness and rarely nausea or dizziness, verbal consent was obtained.  Pre-procedure diagnosis: Somatic dysfunction Post-procedure diagnosis: Same Procedure: OMT  Regions treated include thoracic: HVLA with the  tissue response noted to be Improved.  The patient tolerated the procedure well, and there were no complications noted.  The patient was warned of the possibility of increased pain or stiffness of up to 48 hours duration and was asked to call with any unexpected problems.   Assessment and Plan: Acute neck pain - Plan: meloxicam (MOBIC) 15 MG tablet, methocarbamol (ROBAXIN) 500 MG tablet, ketorolac (TORADOL) injection 60 mg  Need for influenza vaccination - Plan: Flu Vaccine QUAD 6+ mos PF IM (Fluarix Quad PF)  Orders as above. Injection today.  Reorder muscle relaxant and anti-inflammatory.  Home stretches and exercises given.  Heat.  Tylenol.  Letter for work given. F/u prn.  The patient voiced understanding and agreement to the plan.  [place], DO 08/14/17  3:54 PM

## 2017-08-14 NOTE — Patient Instructions (Signed)
Do not take NSAIDs while on the meloxicam.   OK to take Tylenol 1000 mg (2 extra strength tabs) or 975 mg (3 regular strength tabs) every 6 hours as needed.  Heat (pad or rice pillow in microwave) over affected area, 10-15 minutes every 2-3 hours while awake.   EXERCISES RANGE OF MOTION (ROM) AND STRETCHING EXERCISES  These exercises may help you when beginning to rehabilitate your issue. In order to successfully resolve your symptoms, you must improve your posture. These exercises are designed to help reduce the forward-head and rounded-shoulder posture which contributes to this condition. Your symptoms may resolve with or without further involvement from your physician, physical therapist or athletic trainer. While completing these exercises, remember:   Restoring tissue flexibility helps normal motion to return to the joints. This allows healthier, less painful movement and activity.  An effective stretch should be held for at least 20 seconds, although you may need to begin with shorter hold times for comfort.  A stretch should never be painful. You should only feel a gentle lengthening or release in the stretched tissue.  Do not do any stretch or exercise that you cannot tolerate.  STRETCH- Axial Extensors  Lie on your back on the floor. You may bend your knees for comfort. Place a rolled-up hand towel or dish towel, about 2 inches in diameter, under the part of your head that makes contact with the floor.  Gently tuck your chin, as if trying to make a "double chin," until you feel a gentle stretch at the base of your head.  Hold 15-20 seconds. Repeat 2-3 times. Complete this exercise 1 time per day.   STRETCH - Axial Extension   Stand or sit on a firm surface. Assume a good posture: chest up, shoulders drawn back, abdominal muscles slightly tense, knees unlocked (if standing) and feet hip width apart.  Slowly retract your chin so your head slides back and your chin slightly  lowers. Continue to look straight ahead.  You should feel a gentle stretch in the back of your head. Be certain not to feel an aggressive stretch since this can cause headaches later.  Hold for 15-20 seconds. Repeat 2-3 times. Complete this exercise 1 time per day.  STRETCH - Cervical Side Bend   Stand or sit on a firm surface. Assume a good posture: chest up, shoulders drawn back, abdominal muscles slightly tense, knees unlocked (if standing) and feet hip width apart.  Without letting your nose or shoulders move, slowly tip your right / left ear to your shoulder until your feel a gentle stretch in the muscles on the opposite side of your neck.  Hold 15-20 seconds. Repeat 2-3 times. Complete this exercise 1-2 times per day.  STRETCH - Cervical Rotators   Stand or sit on a firm surface. Assume a good posture: chest up, shoulders drawn back, abdominal muscles slightly tense, knees unlocked (if standing) and feet hip width apart.  Keeping your eyes level with the ground, slowly turn your head until you feel a gentle stretch along the back and opposite side of your neck.  Hold 15-20 seconds. Repeat 2-3 times. Complete this exercise 1-2 times per day.  RANGE OF MOTION - Neck Circles   Stand or sit on a firm surface. Assume a good posture: chest up, shoulders drawn back, abdominal muscles slightly tense, knees unlocked (if standing) and feet hip width apart.  Gently roll your head down and around from the back of one shoulder to the back  of the other. The motion should never be forced or painful.  Repeat the motion 10-20 times, or until you feel the neck muscles relax and loosen. Repeat 2-3 times. Complete the exercise 1-2 times per day. STRENGTHENING EXERCISES - Cervical Strain and Sprain These exercises may help you when beginning to rehabilitate your injury. They may resolve your symptoms with or without further involvement from your physician, physical therapist, or athletic trainer.  While completing these exercises, remember:   Muscles can gain both the endurance and the strength needed for everyday activities through controlled exercises.  Complete these exercises as instructed by your physician, physical therapist, or athletic trainer. Progress the resistance and repetitions only as guided.  You may experience muscle soreness or fatigue, but the pain or discomfort you are trying to eliminate should never worsen during these exercises. If this pain does worsen, stop and make certain you are following the directions exactly. If the pain is still present after adjustments, discontinue the exercise until you can discuss the trouble with your clinician.  STRENGTH - Cervical Flexors, Isometric  Face a wall, standing about 6 inches away. Place a small pillow, a ball about 6-8 inches in diameter, or a folded towel between your forehead and the wall.  Slightly tuck your chin and gently push your forehead into the soft object. Push only with mild to moderate intensity, building up tension gradually. Keep your jaw and forehead relaxed.  Hold 10 to 20 seconds. Keep your breathing relaxed.  Release the tension slowly. Relax your neck muscles completely before you start the next repetition. Repeat 2-3 times. Complete this exercise 1 time per day.  STRENGTH- Cervical Lateral Flexors, Isometric   Stand about 6 inches away from a wall. Place a small pillow, a ball about 6-8 inches in diameter, or a folded towel between the side of your head and the wall.  Slightly tuck your chin and gently tilt your head into the soft object. Push only with mild to moderate intensity, building up tension gradually. Keep your jaw and forehead relaxed.  Hold 10 to 20 seconds. Keep your breathing relaxed.  Release the tension slowly. Relax your neck muscles completely before you start the next repetition. Repeat 2-3 times. Complete this exercise 1 time per day.  STRENGTH - Cervical Extensors,  Isometric   Stand about 6 inches away from a wall. Place a small pillow, a ball about 6-8 inches in diameter, or a folded towel between the back of your head and the wall.  Slightly tuck your chin and gently tilt your head back into the soft object. Push only with mild to moderate intensity, building up tension gradually. Keep your jaw and forehead relaxed.  Hold 10 to 20 seconds. Keep your breathing relaxed.  Release the tension slowly. Relax your neck muscles completely before you start the next repetition. Repeat 2-3 times. Complete this exercise 1 time per day.  POSTURE AND BODY MECHANICS CONSIDERATIONS Keeping correct posture when sitting, standing or completing your activities will reduce the stress put on different body tissues, allowing injured tissues a chance to heal and limiting painful experiences. The following are general guidelines for improved posture. Your physician or physical therapist will provide you with any instructions specific to your needs. While reading these guidelines, remember:  The exercises prescribed by your provider will help you have the flexibility and strength to maintain correct postures.  The correct posture provides the optimal environment for your joints to work. All of your joints have less wear  and tear when properly supported by a spine with good posture. This means you will experience a healthier, less painful body.  Correct posture must be practiced with all of your activities, especially prolonged sitting and standing. Correct posture is as important when doing repetitive low-stress activities (typing) as it is when doing a single heavy-load activity (lifting).  PROLONGED STANDING WHILE SLIGHTLY LEANING FORWARD When completing a task that requires you to lean forward while standing in one place for a long time, place either foot up on a stationary 2- to 4-inch high object to help maintain the best posture. When both feet are on the ground, the low  back tends to lose its slight inward curve. If this curve flattens (or becomes too large), then the back and your other joints will experience too much stress, fatigue more quickly, and can cause pain.   RESTING POSITIONS Consider which positions are most painful for you when choosing a resting position. If you have pain with flexion-based activities (sitting, bending, stooping, squatting), choose a position that allows you to rest in a less flexed posture. You would want to avoid curling into a fetal position on your side. If your pain worsens with extension-based activities (prolonged standing, working overhead), avoid resting in an extended position such as sleeping on your stomach. Most people will find more comfort when they rest with their spine in a more neutral position, neither too rounded nor too arched. Lying on a non-sagging bed on your side with a pillow between your knees, or on your back with a pillow under your knees will often provide some relief. Keep in mind, being in any one position for a prolonged period of time, no matter how correct your posture, can still lead to stiffness.  WALKING Walk with an upright posture. Your ears, shoulders, and hips should all line up. OFFICE WORK When working at a desk, create an environment that supports good, upright posture. Without extra support, muscles fatigue and lead to excessive strain on joints and other tissues.  CHAIR:  A chair should be able to slide under your desk when your back makes contact with the back of the chair. This allows you to work closely.  The chair's height should allow your eyes to be level with the upper part of your monitor and your hands to be slightly lower than your elbows.  Body position: ? Your feet should make contact with the floor. If this is not possible, use a foot rest. ? Keep your ears over your shoulders. This will reduce stress on your neck and low back.

## 2017-08-14 NOTE — Progress Notes (Signed)
Pre visit review using our clinic review tool, if applicable. No additional management support is needed unless otherwise documented below in the visit note. 

## 2017-08-14 NOTE — Telephone Encounter (Signed)
Ok to refill until ov and I will sign

## 2017-08-14 NOTE — Telephone Encounter (Signed)
OK to refill if can be signed

## 2017-08-14 NOTE — Telephone Encounter (Signed)
Dr. Nani Ravens saw this patient today 08/14/2017 for neck pain. He took care of giving her a 3 month supply of her vyvanse-patient stated has been several days without her medication.

## 2017-09-09 ENCOUNTER — Other Ambulatory Visit: Payer: Self-pay

## 2017-09-09 DIAGNOSIS — M542 Cervicalgia: Secondary | ICD-10-CM

## 2017-09-09 MED ORDER — MELOXICAM 15 MG PO TABS: 15.0000 mg | ORAL_TABLET | Freq: Every day | ORAL | 0 refills | Status: DC

## 2017-09-11 ENCOUNTER — Telehealth: Payer: Self-pay | Admitting: Family Medicine

## 2017-09-11 ENCOUNTER — Encounter: Payer: Self-pay | Admitting: Family Medicine

## 2017-09-11 NOTE — Telephone Encounter (Signed)
Copied from [place]. Topic: Quick Communication - Rx Refill/Question >> Sep 11, 2017  9:24 AM Robina Ade, Helene Kelp D wrote: Has the patient contacted their pharmacy? Yes (Agent: If no, request that the patient contact the pharmacy for the refill.) Preferred Pharmacy (with phone number or street name): [id number] - [place], [place]. [address] Agent: Please be advised that RX refills may take up to 48 hours. We ask that you follow-up with your pharmacy. [facility] and wanted to know if patient lisdexamfetamine (VYVANSE) 60 MG capsule can be refill sooner then rx date? Please call pharmacy back.

## 2017-09-11 NOTE — Telephone Encounter (Signed)
See medication request. Thanks.

## 2017-09-12 NOTE — Telephone Encounter (Signed)
Responded via mychart

## 2017-10-02 ENCOUNTER — Encounter: Payer: Self-pay | Admitting: Family Medicine

## 2017-10-03 ENCOUNTER — Other Ambulatory Visit: Payer: Self-pay | Admitting: Family Medicine

## 2017-10-03 DIAGNOSIS — F32A Depression, unspecified: Secondary | ICD-10-CM

## 2017-10-03 DIAGNOSIS — F329 Major depressive disorder, single episode, unspecified: Secondary | ICD-10-CM

## 2017-10-16 ENCOUNTER — Encounter: Payer: Self-pay | Admitting: Family Medicine

## 2017-10-16 ENCOUNTER — Ambulatory Visit: Payer: BLUE CROSS/BLUE SHIELD | Admitting: Family Medicine

## 2017-10-16 DIAGNOSIS — G43809 Other migraine, not intractable, without status migrainosus: Secondary | ICD-10-CM

## 2017-10-16 DIAGNOSIS — E669 Obesity, unspecified: Secondary | ICD-10-CM | POA: Diagnosis not present

## 2017-10-16 DIAGNOSIS — F988 Other specified behavioral and emotional disorders with onset usually occurring in childhood and adolescence: Secondary | ICD-10-CM

## 2017-10-16 MED ORDER — RIZATRIPTAN BENZOATE 10 MG PO TABS: ORAL_TABLET | ORAL | 3 refills | Status: DC

## 2017-10-16 MED ORDER — FLUCONAZOLE 150 MG PO TABS: 150.0000 mg | ORAL_TABLET | ORAL | 3 refills | Status: DC

## 2017-10-16 MED ORDER — LISDEXAMFETAMINE DIMESYLATE 70 MG PO CAPS
70.0000 mg | ORAL_CAPSULE | Freq: Every day | ORAL | 0 refills | Status: DC
Start: 2017-10-16 — End: 2017-10-28

## 2017-10-16 NOTE — Assessment & Plan Note (Signed)
Encouraged DASH diet, decrease po intake and increase exercise as tolerated. Needs 7-8 hours of sleep nightly. Avoid trans fats, eat small, frequent meals every 4-5 hours with lean proteins, complex carbs and healthy fats. Minimize simple carbs, bariatric referral offered

## 2017-10-16 NOTE — Progress Notes (Signed)
Subjective:  I acted as a Education administrator for [employer]. Bonnie Lewis, [place]   Patient ID: Bonnie Lewis, female    DOB: 19-Nov-1985, 32 y.o.   MRN: [id number]  Chief Complaint  Patient presents with  . Follow-up    HPI  Patient is in today for follow up visit and overall is doing well.  She is frustrated with weight gain and notes over the holidays life was a little stressful with this being the first holiday since her father's been gone.  Overall her physical health is good.  No recent febrile illness or hospitalization.  She is questioning if the Vyvanse is working as well as it once did.  Is considering increasing the dosing.  No palpitations or acute concerns with the medication are noted. Denies CP/SOB/congestion/fevers/GI or GU c/o. Taking meds as prescribed  Patient Care Team: Mosie Lukes, MD as PCP - General   Past Medical History:  Diagnosis Date  . Acne 04/14/2015  . ADD (attention deficit disorder)   . Anemia 09/12/2011  . Bronchitis 08/11/2011  . Cervical cancer ([place]) 10/19/2012  . Cervical cancer screening 10/19/2012  . Dyslipidemia 10/20/2012  . Dysmenorrhea 10/20/2012  . Insomnia 09/12/2011  . LLQ pain 08/11/2011  . Migraine 05/14/2013  . Obesity 06/27/2010   Qualifier: Diagnosis of  By: Domingo Cocking RMA, Christy    . Preventative health care 09/16/2011  . Seasonal affective disorder ([place]) 10/19/2012  . Vaginitis 10/19/2012  . Viral pharyngitis 01/10/2016    History reviewed. No pertinent surgical history.  Family History  Problem Relation Age of Onset  . Asthma Father   . Depression Father        and anxiety  . Allergies Father   . Irritable bowel syndrome Father   . Dementia Father   . Depression Sister   . Irritable bowel syndrome Sister   . Migraines Sister   . Depression Brother   . Allergies Brother   . Depression Maternal Grandmother        Anxiety  . Hypertension Maternal Grandmother   . ADD / ADHD Maternal Grandmother   . Menstrual problems Maternal Grandmother    heavy bleeding requiring hysterectomy  . Depression Paternal Grandmother   . Cardiomyopathy Paternal Grandmother   . Menstrual problems Paternal Grandmother   . Hyperlipidemia Mother   . Anemia Sister   . Coronary artery disease Paternal Grandfather   . Cancer Paternal Aunt     Social History   Socioeconomic History  . Marital status: Single    Spouse name: Not on file  . Number of children: Not on file  . Years of education: Not on file  . Highest education level: Not on file  Social Needs  . Financial resource strain: Not on file  . Food insecurity - worry: Not on file  . Food insecurity - inability: Not on file  . Transportation needs - medical: Not on file  . Transportation needs - non-medical: Not on file  Occupational History  . Not on file  Tobacco Use  . Smoking status: Never Smoker  . Smokeless tobacco: Never Used  Substance and Sexual Activity  . Alcohol use: No  . Drug use: No  . Sexual activity: No    Comment: lives at home to help her mother with her father who has early onset Alzheimer's  Other Topics Concern  . Not on file  Social History Narrative  . Not on file    Outpatient Medications Prior to Visit  Medication Sig Dispense Refill  .  amoxicillin (AMOXIL) 500 MG capsule Take 1 capsule (500 mg total) by mouth 3 (three) times daily. 30 capsule 0  . citalopram (CELEXA) 20 MG tablet Take 1 tablet (20 mg total) by mouth daily. 90 tablet 1  . desogestrel-ethinyl estradiol (VIORELE) 0.15-0.02/0.01 MG (21/5) tablet Take 1 tablet by mouth daily. 90 tablet 3  . meloxicam (MOBIC) 15 MG tablet Take 1 tablet (15 mg total) by mouth daily. 30 tablet 0  . methocarbamol (ROBAXIN) 500 MG tablet Take 1 tablet (500 mg total) by mouth 2 (two) times daily. 60 tablet 0  . valACYclovir (VALTREX) 1000 MG tablet Take 2 tablets (2,000 mg total) by mouth 2 (two) times daily. 4 tablet 1  . fluconazole (DIFLUCAN) 150 MG tablet Take 1 tablet (150 mg total) by mouth once a week. 2  tablet 0  . lisdexamfetamine (VYVANSE) 60 MG capsule Take 1 capsule (60 mg total) by mouth every morning. January 2019 30 capsule 0  . rizatriptan (MAXALT) 10 MG tablet TAKE 1 TABLET (10 MG TOTAL) BY MOUTH AS NEEDED FOR MIGRAINE. MAY REPEAT IN 2 HOURS IF NEEDED 10 tablet 1   No facility-administered medications prior to visit.     Allergies  Allergen Reactions  . Sulfonamide Derivatives     Review of Systems  Constitutional: Negative for fever and malaise/fatigue.  HENT: Negative for congestion.   Eyes: Negative for blurred vision.  Respiratory: Negative for shortness of breath.   Cardiovascular: Negative for chest pain, palpitations and leg swelling.  Gastrointestinal: Negative for abdominal pain, blood in stool and nausea.  Genitourinary: Negative for dysuria and frequency.  Musculoskeletal: Negative for falls.  Skin: Negative for rash.  Neurological: Positive for headaches. Negative for dizziness and loss of consciousness.  Endo/Heme/Allergies: Negative for environmental allergies.  Psychiatric/Behavioral: Negative for depression. The patient is not nervous/anxious.        Objective:    Physical Exam  Constitutional: She is oriented to person, place, and time. She appears well-developed and well-nourished. No distress.  HENT:  Head: Normocephalic and atraumatic.  Nose: Nose normal.  Eyes: Right eye exhibits no discharge. Left eye exhibits no discharge.  Neck: Normal range of motion. Neck supple.  Cardiovascular: Normal rate and regular rhythm.  No murmur heard. Pulmonary/Chest: Effort normal and breath sounds normal.  Abdominal: Soft. Bowel sounds are normal. There is no tenderness.  Musculoskeletal: She exhibits no edema.  Neurological: She is alert and oriented to person, place, and time.  Skin: Skin is warm and dry.  Psychiatric: She has a normal mood and affect.  Nursing note and vitals reviewed.   BP 132/89 (BP Location: Left Arm, Patient Position: Sitting,  Cuff Size: Large)   Pulse 88   Temp 98.5 F (36.9 C) (Oral)   Resp 16   Ht 5' 4.17" (1.63 m)   Wt 228 lb (103.4 kg)   SpO2 100%   BMI 38.93 kg/m  Wt Readings from Last 3 Encounters:  10/16/17 228 lb (103.4 kg)  08/14/17 223 lb (101.2 kg)  04/10/17 219 lb 8 oz (99.6 kg)   BP Readings from Last 3 Encounters:  10/16/17 132/89  08/14/17 122/88  04/10/17 118/72     Immunization History  Administered Date(s) Administered  . HPV Quadrivalent 01/28/2011, 06/19/2011  . Hpv 12/19/2010  . Influenza Split 10/19/2012  . Influenza Whole 06/27/2010, 06/19/2011  . Influenza, Seasonal, Injecte, Preservative Fre 07/15/2015  . Influenza,inj,Quad PF,6+ Mos 06/09/2014, 08/14/2017  . Td 04/13/2010    Health Maintenance  Topic Date Due  .  HIV Screening  05/27/2002  . PAP SMEAR  04/10/2020  . TETANUS/TDAP  04/13/2020  . INFLUENZA VACCINE  Completed    Lab Results  Component Value Date   WBC 6.9 04/10/2017   HGB 12.5 04/10/2017   HCT 37.2 04/10/2017   PLT 327.0 04/10/2017   GLUCOSE 99 04/10/2017   CHOL 171 04/10/2017   TRIG 90.0 04/10/2017   HDL 58.60 04/10/2017   LDLDIRECT 110.1 10/19/2012   LDLCALC 94 04/10/2017   ALT 36 (H) 04/10/2017   AST 28 04/10/2017   NA 137 04/10/2017   K 4.4 04/10/2017   CL 105 04/10/2017   CREATININE 0.86 04/10/2017   BUN 15 04/10/2017   CO2 23 04/10/2017   TSH 2.61 04/10/2017    Lab Results  Component Value Date   TSH 2.61 04/10/2017   Lab Results  Component Value Date   WBC 6.9 04/10/2017   HGB 12.5 04/10/2017   HCT 37.2 04/10/2017   MCV 88.5 04/10/2017   PLT 327.0 04/10/2017   Lab Results  Component Value Date   NA 137 04/10/2017   K 4.4 04/10/2017   CO2 23 04/10/2017   GLUCOSE 99 04/10/2017   BUN 15 04/10/2017   CREATININE 0.86 04/10/2017   BILITOT 0.3 04/10/2017   ALKPHOS 53 04/10/2017   AST 28 04/10/2017   ALT 36 (H) 04/10/2017   PROT 6.9 04/10/2017   ALBUMIN 4.3 04/10/2017   CALCIUM 9.2 04/10/2017   GFR 82.42  04/10/2017   Lab Results  Component Value Date   CHOL 171 04/10/2017   Lab Results  Component Value Date   HDL 58.60 04/10/2017   Lab Results  Component Value Date   LDLCALC 94 04/10/2017   Lab Results  Component Value Date   TRIG 90.0 04/10/2017   Lab Results  Component Value Date   CHOLHDL 3 04/10/2017   No results found for: HGBA1C       Assessment & Plan:   Problem List Items Addressed This Visit    Obesity    Encouraged DASH diet, decrease po intake and increase exercise as tolerated. Needs 7-8 hours of sleep nightly. Avoid trans fats, eat small, frequent meals every 4-5 hours with lean proteins, complex carbs and healthy fats. Minimize simple carbs, bariatric referral offered      Relevant Medications   lisdexamfetamine (VYVANSE) 70 MG capsule   Migraine    Allowed refill on Maxalt needs it infrequently but it works well when she needs it      Relevant Medications   rizatriptan (MAXALT) 10 MG tablet   ADD (attention deficit disorder)    vyvanse at 60 mg not working as well presently, will give a 30 day supply of Vyvanse 70 mg tabs, 1 tab po daily and check bp and pulse in 1 month. Call with any concerns.         I have discontinued Arika Alms's lisdexamfetamine. I am also having her start on lisdexamfetamine. Additionally, I am having her maintain her amoxicillin, valACYclovir, desogestrel-ethinyl estradiol, methocarbamol, meloxicam, citalopram, fluconazole, and rizatriptan.  Meds ordered this encounter  Medications  . fluconazole (DIFLUCAN) 150 MG tablet    Sig: Take 1 tablet (150 mg total) by mouth once a week.    Dispense:  2 tablet    Refill:  3  . rizatriptan (MAXALT) 10 MG tablet    Sig: TAKE 1 TABLET (10 MG TOTAL) BY MOUTH AS NEEDED FOR MIGRAINE. MAY REPEAT IN 2 HOURS IF NEEDED    Dispense:  10 tablet    Refill:  3  . lisdexamfetamine (VYVANSE) 70 MG capsule    Sig: Take 1 capsule (70 mg total) by mouth daily.    Dispense:  30 capsule     Refill:  0    CMA served as scribe during this visit. History, Physical and Plan performed by medical provider. Documentation and orders reviewed and attested to.  Penni Homans, MD

## 2017-10-16 NOTE — Patient Instructions (Addendum)
Tinea Corporis /Ringworm use Clotrimazole cream twice daily for next month or so   DASH Eating Plan DASH stands for "Dietary Approaches to Stop Hypertension." The DASH eating plan is a healthy eating plan that has been shown to reduce high blood pressure (hypertension). It may also reduce your risk for type 2 diabetes, heart disease, and stroke. The DASH eating plan may also help with weight loss. What are tips for following this plan? General guidelines  Avoid eating more than 2,300 mg (milligrams) of salt (sodium) a day. If you have hypertension, you may need to reduce your sodium intake to 1,500 mg a day.  Limit alcohol intake to no more than 1 drink a day for nonpregnant women and 2 drinks a day for men. One drink equals 12 oz of beer, 5 oz of wine, or 1 oz of hard liquor.  Work with your health care provider to maintain a healthy body weight or to lose weight. Ask what an ideal weight is for you.  Get at least 30 minutes of exercise that causes your heart to beat faster (aerobic exercise) most days of the week. Activities may include walking, swimming, or biking.  Work with your health care provider or diet and nutrition specialist (dietitian) to adjust your eating plan to your individual calorie needs. Reading food labels  Check food labels for the amount of sodium per serving. Choose foods with less than 5 percent of the Daily Value of sodium. Generally, foods with less than 300 mg of sodium per serving fit into this eating plan.  To find whole grains, look for the word "whole" as the first word in the ingredient list. Shopping  Buy products labeled as "low-sodium" or "no salt added."  Buy fresh foods. Avoid canned foods and premade or frozen meals. Cooking  Avoid adding salt when cooking. Use salt-free seasonings or herbs instead of table salt or sea salt. Check with your health care provider or pharmacist before using salt substitutes.  Do not fry foods. Cook foods using  healthy methods such as baking, boiling, grilling, and broiling instead.  Cook with heart-healthy oils, such as olive, canola, soybean, or sunflower oil. Meal planning   Eat a balanced diet that includes: ? 5 or more servings of fruits and vegetables each day. At each meal, try to fill half of your plate with fruits and vegetables. ? Up to 6-8 servings of whole grains each day. ? Less than 6 oz of lean meat, poultry, or fish each day. A 3-oz serving of meat is about the same size as a deck of cards. One egg equals 1 oz. ? 2 servings of low-fat dairy each day. ? A serving of nuts, seeds, or beans 5 times each week. ? Heart-healthy fats. Healthy fats called Omega-3 fatty acids are found in foods such as flaxseeds and coldwater fish, like sardines, salmon, and mackerel.  Limit how much you eat of the following: ? Canned or prepackaged foods. ? Food that is high in trans fat, such as fried foods. ? Food that is high in saturated fat, such as fatty meat. ? Sweets, desserts, sugary drinks, and other foods with added sugar. ? Full-fat dairy products.  Do not salt foods before eating.  Try to eat at least 2 vegetarian meals each week.  Eat more home-cooked food and less restaurant, buffet, and fast food.  When eating at a restaurant, ask that your food be prepared with less salt or no salt, if possible. What foods are recommended?  The items listed may not be a complete list. Talk with your dietitian about what dietary choices are best for you. Grains Whole-grain or whole-wheat bread. Whole-grain or whole-wheat pasta. Brown rice. Modena [place]row. Bulgur. Whole-grain and low-sodium cereals. Pita bread. Low-fat, low-sodium crackers. Whole-wheat flour tortillas. Vegetables Fresh or frozen vegetables (raw, steamed, roasted, or grilled). Low-sodium or reduced-sodium tomato and vegetable juice. Low-sodium or reduced-sodium tomato sauce and tomato paste. Low-sodium or reduced-sodium canned  vegetables. Fruits All fresh, dried, or frozen fruit. Canned fruit in natural juice (without added sugar). Meat and other protein foods Skinless chicken or [place]. Ground chicken or [place]. Pork with fat trimmed off. Fish and seafood. Egg whites. Dried beans, peas, or lentils. Unsalted nuts, nut butters, and seeds. Unsalted canned beans. Lean cuts of beef with fat trimmed off. Low-sodium, lean deli meat. Dairy Low-fat (1%) or fat-free (skim) milk. Fat-free, low-fat, or reduced-fat cheeses. Nonfat, low-sodium ricotta or cottage cheese. Low-fat or nonfat yogurt. Low-fat, low-sodium cheese. Fats and oils Soft margarine without trans fats. Vegetable oil. Low-fat, reduced-fat, or light mayonnaise and salad dressings (reduced-sodium). Canola, safflower, olive, soybean, and sunflower oils. Avocado. Seasoning and other foods Herbs. Spices. Seasoning mixes without salt. Unsalted popcorn and pretzels. Fat-free sweets. What foods are not recommended? The items listed may not be a complete list. Talk with your dietitian about what dietary choices are best for you. Grains Baked goods made with fat, such as croissants, muffins, or some breads. Dry pasta or rice meal packs. Vegetables Creamed or fried vegetables. Vegetables in a cheese sauce. Regular canned vegetables (not low-sodium or reduced-sodium). Regular canned tomato sauce and paste (not low-sodium or reduced-sodium). Regular tomato and vegetable juice (not low-sodium or reduced-sodium). Angie Fava. Olives. Fruits Canned fruit in a light or heavy syrup. Fried fruit. Fruit in cream or butter sauce. Meat and other protein foods Fatty cuts of meat. Ribs. Fried meat. Berniece Salines. Sausage. Bologna and other processed lunch meats. Salami. Fatback. Hotdogs. Bratwurst. Salted nuts and seeds. Canned beans with added salt. Canned or smoked fish. Whole eggs or egg yolks. Chicken or [place] with skin. Dairy Whole or 2% milk, cream, and half-and-half. Whole or full-fat  cream cheese. Whole-fat or sweetened yogurt. Full-fat cheese. Nondairy creamers. Whipped toppings. Processed cheese and cheese spreads. Fats and oils Butter. Stick margarine. Lard. Shortening. Ghee. Bacon fat. Tropical oils, such as coconut, palm kernel, or palm oil. Seasoning and other foods Salted popcorn and pretzels. Onion salt, garlic salt, seasoned salt, table salt, and sea salt. Worcestershire sauce. Tartar sauce. Barbecue sauce. Teriyaki sauce. Soy sauce, including reduced-sodium. Steak sauce. Canned and packaged gravies. Fish sauce. Oyster sauce. Cocktail sauce. Horseradish that you find on the shelf. Ketchup. Mustard. Meat flavorings and tenderizers. Bouillon cubes. Hot sauce and Tabasco sauce. Premade or packaged marinades. Premade or packaged taco seasonings. Relishes. Regular salad dressings. Where to find more information:  National Heart, Lung, and [place]: https://wilson-eaton.com/  American Heart Association: www.heart.org Summary  The DASH eating plan is a healthy eating plan that has been shown to reduce high blood pressure (hypertension). It may also reduce your risk for type 2 diabetes, heart disease, and stroke.  With the DASH eating plan, you should limit salt (sodium) intake to 2,300 mg a day. If you have hypertension, you may need to reduce your sodium intake to 1,500 mg a day.  When on the DASH eating plan, aim to eat more fresh fruits and vegetables, whole grains, lean proteins, low-fat dairy, and heart-healthy fats.  Work with your health care  provider or diet and nutrition specialist (dietitian) to adjust your eating plan to your individual calorie needs. This information is not intended to replace advice given to you by your health care provider. Make sure you discuss any questions you have with your health care provider. Document Released: 09/19/2011 Document Revised: 09/23/2016 Document Reviewed: 09/23/2016 Elsevier Interactive Patient Education  [employer].

## 2017-10-16 NOTE — Assessment & Plan Note (Signed)
vyvanse at 60 mg not working as well presently, will give a 30 day supply of Vyvanse 70 mg tabs, 1 tab po daily and check bp and pulse in 1 month. Call with any concerns.

## 2017-10-16 NOTE — Assessment & Plan Note (Signed)
Allowed refill on Maxalt needs it infrequently but it works well when she needs it

## 2017-10-27 ENCOUNTER — Encounter: Payer: Self-pay | Admitting: Family Medicine

## 2017-10-28 ENCOUNTER — Other Ambulatory Visit: Payer: Self-pay

## 2017-10-28 MED ORDER — LISDEXAMFETAMINE DIMESYLATE 60 MG PO CAPS: 60.0000 mg | ORAL_CAPSULE | ORAL | 0 refills | Status: DC

## 2017-11-11 ENCOUNTER — Other Ambulatory Visit: Payer: Self-pay | Admitting: Family Medicine

## 2017-11-13 ENCOUNTER — Encounter: Payer: Self-pay | Admitting: Family Medicine

## 2017-11-13 NOTE — Telephone Encounter (Signed)
Copied from [place]. Topic: Quick Communication - See Telephone Encounter >> Nov 13, 2017  4:31 PM Vernona Rieger wrote: CRM for notification. See Telephone encounter for:   11/13/17.   Patient said the pharmacy denied her refill for lisdexamfetamine (VYVANSE) 60 MG capsule She said she never picked it up for January [id number] They wanted to give her a 70mg , she wants 60mg . She only has 1 pill left.

## 2017-11-14 ENCOUNTER — Encounter: Payer: Self-pay | Admitting: Family Medicine

## 2017-11-18 ENCOUNTER — Ambulatory Visit: Payer: BLUE CROSS/BLUE SHIELD

## 2017-11-20 ENCOUNTER — Other Ambulatory Visit: Payer: Self-pay

## 2017-11-20 MED ORDER — LISDEXAMFETAMINE DIMESYLATE 60 MG PO CAPS: 60.0000 mg | ORAL_CAPSULE | ORAL | 0 refills | Status: DC

## 2017-11-30 ENCOUNTER — Encounter: Payer: Self-pay | Admitting: Family Medicine

## 2017-12-01 ENCOUNTER — Other Ambulatory Visit: Payer: Self-pay | Admitting: Family Medicine

## 2017-12-01 MED ORDER — AMOXICILLIN-POT CLAVULANATE 875-125 MG PO TABS
1.0000 | ORAL_TABLET | Freq: Two times a day (BID) | ORAL | 0 refills | Status: DC
Start: 2017-12-01 — End: 2018-04-14

## 2017-12-02 ENCOUNTER — Telehealth: Payer: Self-pay | Admitting: Family Medicine

## 2017-12-02 NOTE — Telephone Encounter (Signed)
Called to move appt time to 3:30pm instead of 3:45pm tomorrow. The appt for nurse should be 30 mins not 15 mins.

## 2017-12-03 ENCOUNTER — Ambulatory Visit: Payer: BLUE CROSS/BLUE SHIELD

## 2017-12-10 ENCOUNTER — Other Ambulatory Visit: Payer: Self-pay | Admitting: Family Medicine

## 2017-12-10 ENCOUNTER — Telehealth: Payer: Self-pay

## 2017-12-10 ENCOUNTER — Ambulatory Visit (INDEPENDENT_AMBULATORY_CARE_PROVIDER_SITE_OTHER): Payer: BLUE CROSS/BLUE SHIELD | Admitting: Medical

## 2017-12-10 VITALS — BP 115/83 | HR 67 | Resp 14

## 2017-12-10 DIAGNOSIS — F988 Other specified behavioral and emotional disorders with onset usually occurring in childhood and adolescence: Secondary | ICD-10-CM | POA: Diagnosis not present

## 2017-12-10 MED ORDER — LISDEXAMFETAMINE DIMESYLATE 70 MG PO CAPS: 70.0000 mg | ORAL_CAPSULE | Freq: Every day | ORAL | 0 refills | Status: DC

## 2017-12-10 NOTE — Telephone Encounter (Signed)
I have sent a 3 month supply of the Vyvanse 70 mg dose to [facility]. Please let her know

## 2017-12-10 NOTE — Telephone Encounter (Signed)
Pt came in for BP check today- she informed me that she has been having issues getting the correct Vyvanse dosage refilled. On 11/20/2017- Vyvanse 60mg  [[facility]x [facility] [address], at her visit w/ you on 10/16/2017 Vyvanse was increased to 70mg  daily. [facility] have [facility] [address] and [facility] prescriptions for the 60mg  Vyvanse however she is still needing the 70mg  sent. Informed Pt that PCP out of office today- but I would send message for when she returns to office tomorrow. Pt verbalized understanding and thanked me for my help.

## 2017-12-10 NOTE — Progress Notes (Signed)
Pre visit review using our clinic review tool, if applicable. No additional management support is needed unless otherwise documented below in the visit note.   Patient here today for BP check. At [place] on 10/16/2017- Dr. Charlett Blake increased Vyvanse to 70mg  tabs daily and recommended BP check in 1 month.   BP today in R arm: 115/83 Pulse: 67  Per Percell Miller- continue same medications. Follow-up with Dr. Charlett Blake as already scheduled on 04/14/2018.   This is advice that was given after discussion with CMA.  Mackie Pai, PA-C

## 2017-12-10 NOTE — Patient Instructions (Addendum)
BP looks great! Continue same medications. Follow-up with Dr. Charlett Blake as already scheduled on 04/14/2018.

## 2017-12-11 NOTE — Telephone Encounter (Signed)
LMOM informing Pt that 70mg  prescriptions have been sent.

## 2018-01-08 ENCOUNTER — Other Ambulatory Visit: Payer: Self-pay | Admitting: Family Medicine

## 2018-01-08 DIAGNOSIS — F988 Other specified behavioral and emotional disorders with onset usually occurring in childhood and adolescence: Secondary | ICD-10-CM

## 2018-01-13 NOTE — Telephone Encounter (Signed)
Rx was sent to pharmacy last month for 3 months

## 2018-02-13 ENCOUNTER — Telehealth: Payer: Self-pay | Admitting: *Deleted

## 2018-02-13 NOTE — Telephone Encounter (Signed)
Received patient assistance program letter from [facility] for pt's open enrollment for her px Vyvanse stating that her enrollment person for prescription drug assistance will be ending on 04/19/18. Will call patient to verify that pt is still experiencing financial hardship and wishes to continue participating in [facility]; if she does we will need to complete a new application for enrollment/SLS 05/03

## 2018-03-10 ENCOUNTER — Other Ambulatory Visit: Payer: Self-pay | Admitting: Family Medicine

## 2018-03-10 ENCOUNTER — Encounter: Payer: Self-pay | Admitting: Family Medicine

## 2018-03-10 DIAGNOSIS — F988 Other specified behavioral and emotional disorders with onset usually occurring in childhood and adolescence: Secondary | ICD-10-CM

## 2018-03-10 MED ORDER — LISDEXAMFETAMINE DIMESYLATE 70 MG PO CAPS: 70.0000 mg | ORAL_CAPSULE | Freq: Every day | ORAL | 0 refills | Status: DC

## 2018-03-10 NOTE — Telephone Encounter (Signed)
Requesting:Vyvanse Contract:04/10/17 UDS:11/25/14 Last Visit:12/10/17 Next Visit: 04/14/18 Last Refill:  3 prescriptions  No discrepancies  Please Advise

## 2018-04-02 ENCOUNTER — Encounter: Payer: Self-pay | Admitting: Family Medicine

## 2018-04-04 ENCOUNTER — Other Ambulatory Visit: Payer: Self-pay | Admitting: Family Medicine

## 2018-04-04 DIAGNOSIS — F32A Depression, unspecified: Secondary | ICD-10-CM

## 2018-04-04 DIAGNOSIS — F329 Major depressive disorder, single episode, unspecified: Secondary | ICD-10-CM

## 2018-04-06 ENCOUNTER — Telehealth: Payer: Self-pay | Admitting: Family Medicine

## 2018-04-06 NOTE — Telephone Encounter (Signed)
Copied from [place] [id number]. Topic: Quick Communication - See Telephone Encounter >> Apr 06, 2018 12:49 PM Rutherford Nail, NT wrote: CRM for notification. See Telephone encounter for: 04/06/18. Patient send over a fax to be completed for her to be able to get her vyvanse. Informed it could take 5-7 business days. Would like a call once this is complete. CB#: [phone]

## 2018-04-07 NOTE — Telephone Encounter (Addendum)
Received Patient Assistance Form for Vyvanse via [facility] Patient Assistance; completed as much as possible, called and verified fax number given on note [local number] was a secure fax number for patient Bonnie Lewis is her employer]; forwarded to provider/SLS 06/25

## 2018-04-07 NOTE — Telephone Encounter (Signed)
You 34 minutes ago (11:26 AM)      Received Patient Assistance Form for Vyvanse via [place]; completed as much as possible, called and verified fax number given on note [local number] was a secure fax number for patient Bonnie Lewis is her employer]; forwarded to provider/SLS 06/25       Documentation

## 2018-04-09 ENCOUNTER — Encounter: Payer: Self-pay | Admitting: Family Medicine

## 2018-04-10 ENCOUNTER — Encounter: Payer: Self-pay | Admitting: Family Medicine

## 2018-04-13 ENCOUNTER — Encounter: Payer: Self-pay | Admitting: Family Medicine

## 2018-04-13 ENCOUNTER — Other Ambulatory Visit: Payer: Self-pay | Admitting: Family Medicine

## 2018-04-13 DIAGNOSIS — F988 Other specified behavioral and emotional disorders with onset usually occurring in childhood and adolescence: Secondary | ICD-10-CM

## 2018-04-13 MED ORDER — LISDEXAMFETAMINE DIMESYLATE 70 MG PO CAPS: 70.0000 mg | ORAL_CAPSULE | Freq: Every day | ORAL | 0 refills | Status: DC

## 2018-04-14 ENCOUNTER — Ambulatory Visit (INDEPENDENT_AMBULATORY_CARE_PROVIDER_SITE_OTHER): Payer: BLUE CROSS/BLUE SHIELD | Admitting: Family Medicine

## 2018-04-14 ENCOUNTER — Encounter: Payer: Self-pay | Admitting: Family Medicine

## 2018-04-14 DIAGNOSIS — Z0000 Encounter for general adult medical examination without abnormal findings: Secondary | ICD-10-CM

## 2018-04-14 DIAGNOSIS — E782 Mixed hyperlipidemia: Secondary | ICD-10-CM | POA: Diagnosis not present

## 2018-04-14 DIAGNOSIS — F988 Other specified behavioral and emotional disorders with onset usually occurring in childhood and adolescence: Secondary | ICD-10-CM | POA: Diagnosis not present

## 2018-04-14 DIAGNOSIS — E669 Obesity, unspecified: Secondary | ICD-10-CM | POA: Diagnosis not present

## 2018-04-14 MED ORDER — VALACYCLOVIR HCL 1 G PO TABS: 2000.0000 mg | ORAL_TABLET | Freq: Two times a day (BID) | ORAL | 1 refills | Status: DC

## 2018-04-14 MED ORDER — DESOGESTREL-ETHINYL ESTRADIOL 0.15-0.02/0.01 MG (21/5) PO TABS: 1.0000 | ORAL_TABLET | Freq: Every day | ORAL | 3 refills | Status: DC

## 2018-04-14 NOTE — Assessment & Plan Note (Signed)
Patient encouraged to maintain heart healthy diet, regular exercise, adequate sleep. Consider daily probiotics. Take medications as prescribed 

## 2018-04-14 NOTE — Progress Notes (Signed)
Subjective:  I acted as a Education administrator for Dr. Charlett Blake. [place], [place]  Patient ID: Bonnie Lewis, female    DOB: [birth year], 32 y.o.   MRN: [id number]  No chief complaint on file.   HPI  Patient is in today for an annual exam and she is doing well. No recent febrile illness or acute hospitalizations. She has just moved with her family to a new home and has been under stress with this and at work. She is in a very good relationship with her family but also feels the need for change and is in a good relationship with a partner and hopes to marry in the future. She has stopped drinking alcohol regularly and quit THC. Doing well with ADLs. She is noting trouble with [place] with the Citalopram and is considering changes. Denies CP/palp/SOB/HA/congestion/fevers/GI or GU c/o. Taking meds as prescribed  Patient Care Team: Mosie Lukes, MD as PCP - General   Past Medical History:  Diagnosis Date  . Acne 04/14/2015  . ADD (attention deficit disorder)   . Anemia 09/12/2011  . Bronchitis 08/11/2011  . Cervical cancer ([place]) 10/19/2012  . Cervical cancer screening 10/19/2012  . Dyslipidemia 10/20/2012  . Dysmenorrhea 10/20/2012  . Insomnia 09/12/2011  . LLQ pain 08/11/2011  . Migraine 05/14/2013  . Obesity 06/27/2010   Qualifier: Diagnosis of  By: Domingo Cocking RMA, Christy    . Preventative health care 09/16/2011  . Seasonal affective disorder ([place]) 10/19/2012  . Vaginitis 10/19/2012  . Viral pharyngitis 01/10/2016    No past surgical history on file.  Family History  Problem Relation Age of Onset  . Asthma Father   . Depression Father        and anxiety  . Allergies Father   . Irritable bowel syndrome Father   . Dementia Father   . Depression Sister   . Irritable bowel syndrome Sister   . Migraines Sister   . Depression Brother   . Allergies Brother   . Depression Maternal Grandmother        Anxiety  . Hypertension Maternal Grandmother   . ADD / ADHD Maternal Grandmother   . Menstrual problems Maternal  Grandmother        heavy bleeding requiring hysterectomy  . Depression Paternal Grandmother   . Cardiomyopathy Paternal Grandmother   . Menstrual problems Paternal Grandmother   . Hyperlipidemia Mother   . Anemia Sister   . Coronary artery disease Paternal Grandfather   . Cancer Paternal Aunt   . Brain cancer Maternal Aunt     Social History   Socioeconomic History  . Marital status: Single    Spouse name: Not on file  . Number of children: Not on file  . Years of education: Not on file  . Highest education level: Not on file  Occupational History  . Not on file  Social Needs  . Financial resource strain: Not on file  . Food insecurity:    Worry: Not on file    Inability: Not on file  . Transportation needs:    Medical: Not on file    Non-medical: Not on file  Tobacco Use  . Smoking status: Never Smoker  . Smokeless tobacco: Never Used  Substance and Sexual Activity  . Alcohol use: Yes    Comment: Sociallly   . Drug use: No  . Sexual activity: Yes    Comment: lives at home to help her mother with her father who has early onset Alzheimer's  Lifestyle  . Physical  activity:    Days per week: Not on file    Minutes per session: Not on file  . Stress: Not on file  Relationships  . Social connections:    Talks on phone: Not on file    Gets together: Not on file    Attends religious service: Not on file    Active member of club or organization: Not on file    Attends meetings of clubs or organizations: Not on file    Relationship status: Not on file  . Intimate partner violence:    Fear of current or ex partner: Not on file    Emotionally abused: Not on file    Physically abused: Not on file    Forced sexual activity: Not on file  Other Topics Concern  . Not on file  Social History Narrative  . Not on file    Outpatient Medications Prior to Visit  Medication Sig Dispense Refill  . lisdexamfetamine (VYVANSE) 70 MG capsule Take 1 capsule (70 mg total) by mouth  daily. September 2019 rx 30 capsule 0  . lisdexamfetamine (VYVANSE) 70 MG capsule Take 1 capsule (70 mg total) by mouth daily. August  2019 rx 30 capsule 0  . lisdexamfetamine (VYVANSE) 70 MG capsule Take 1 capsule (70 mg total) by mouth daily. July 2019 rx 30 capsule 0  . desogestrel-ethinyl estradiol (VIORELE) 0.15-0.02/0.01 MG (21/5) tablet Take 1 tablet by mouth daily. 90 tablet 3  . citalopram (CELEXA) 20 MG tablet TAKE 1 TABLET (20 MG TOTAL) BY MOUTH DAILY. (Patient not taking: Reported on 04/14/2018) 90 tablet 1  . fluconazole (DIFLUCAN) 150 MG tablet Take 1 tablet (150 mg total) by mouth once a week. (Patient not taking: Reported on 04/14/2018) 2 tablet 3  . meloxicam (MOBIC) 15 MG tablet Take 1 tablet (15 mg total) by mouth daily. (Patient not taking: Reported on 04/14/2018) 30 tablet 0  . methocarbamol (ROBAXIN) 500 MG tablet Take 1 tablet (500 mg total) by mouth 2 (two) times daily. (Patient not taking: Reported on 04/14/2018) 60 tablet 0  . rizatriptan (MAXALT) 10 MG tablet TAKE 1 TABLET (10 MG TOTAL) BY MOUTH AS NEEDED FOR MIGRAINE. MAY REPEAT IN 2 HOURS IF NEEDED (Patient not taking: Reported on 04/14/2018) 10 tablet 3  . valACYclovir (VALTREX) 1000 MG tablet Take 2 tablets (2,000 mg total) by mouth 2 (two) times daily. (Patient not taking: Reported on 04/14/2018) 4 tablet 1  . amoxicillin (AMOXIL) 500 MG capsule Take 1 capsule (500 mg total) by mouth 3 (three) times daily. 30 capsule 0  . amoxicillin-clavulanate (AUGMENTIN) 875-125 MG tablet Take 1 tablet by mouth 2 (two) times daily. 20 tablet 0   No facility-administered medications prior to visit.     Allergies  Allergen Reactions  . Sulfonamide Derivatives     Review of Systems  Constitutional: Negative for chills, fever and malaise/fatigue.  HENT: Negative for congestion and hearing loss.   Eyes: Negative for discharge.  Respiratory: Negative for cough, sputum production and shortness of breath.   Cardiovascular: Negative for chest  pain, palpitations and leg swelling.  Gastrointestinal: Negative for abdominal pain, blood in stool, constipation, diarrhea, heartburn, nausea and vomiting.  Genitourinary: Negative for dysuria, frequency, hematuria and urgency.  Musculoskeletal: Negative for back pain, falls and myalgias.  Skin: Negative for rash.  Neurological: Negative for dizziness, sensory change, loss of consciousness, weakness and headaches.  Endo/Heme/Allergies: Negative for environmental allergies. Does not bruise/bleed easily.  Psychiatric/Behavioral: Positive for depression. Negative for suicidal ideas. The  patient is not nervous/anxious and does not have insomnia.        Objective:    Physical Exam  Constitutional: She is oriented to person, place, and time. No distress.  HENT:  Head: Normocephalic and atraumatic.  Right Ear: External ear normal.  Left Ear: External ear normal.  Nose: Nose normal.  Mouth/Throat: Oropharynx is clear and moist. No oropharyngeal exudate.  Eyes: Pupils are equal, round, and reactive to light. Conjunctivae are normal. Right eye exhibits no discharge. Left eye exhibits no discharge. No scleral icterus.  Neck: Normal range of motion. Neck supple. No thyromegaly present.  Cardiovascular: Normal rate, regular rhythm, normal heart sounds and intact distal pulses.  No murmur heard. Pulmonary/Chest: Effort normal and breath sounds normal. No respiratory distress. She has no wheezes. She has no rales.  Abdominal: Soft. Bowel sounds are normal. She exhibits no distension and no mass. There is no tenderness.  Musculoskeletal: Normal range of motion. She exhibits no edema or tenderness.  Lymphadenopathy:    She has no cervical adenopathy.  Neurological: She is alert and oriented to person, place, and time. She has normal reflexes. She displays normal reflexes. No cranial nerve deficit. Coordination normal.  Skin: Skin is warm and dry. No rash noted. She is not diaphoretic.    BP 122/80  (BP Location: Left Arm, Patient Position: Sitting)   Resp 18   Ht 5\' 4"  (1.626 m)   Wt 217 lb 9.6 oz (98.7 kg)   BMI 37.35 kg/m  Wt Readings from Last 3 Encounters:  04/14/18 217 lb 9.6 oz (98.7 kg)  10/16/17 228 lb (103.4 kg)  08/14/17 223 lb (101.2 kg)   BP Readings from Last 3 Encounters:  04/14/18 122/80  12/10/17 115/83  10/16/17 132/89     Immunization History  Administered Date(s) Administered  . HPV Quadrivalent 01/28/2011, 06/19/2011  . Hpv 12/19/2010  . Influenza Split 10/19/2012  . Influenza Whole 06/27/2010, 06/19/2011  . Influenza, Seasonal, Injecte, Preservative Fre 07/15/2015  . Influenza,inj,Quad PF,6+ Mos 06/09/2014, 08/14/2017  . Td 04/13/2010    Health Maintenance  Topic Date Due  . HIV Screening  05/27/2002  . INFLUENZA VACCINE  05/14/2018  . PAP SMEAR  04/10/2020  . TETANUS/TDAP  04/13/2020    Lab Results  Component Value Date   WBC 6.9 04/10/2017   HGB 12.5 04/10/2017   HCT 37.2 04/10/2017   PLT 327.0 04/10/2017   GLUCOSE 99 04/10/2017   CHOL 171 04/10/2017   TRIG 90.0 04/10/2017   HDL 58.60 04/10/2017   LDLDIRECT 110.1 10/19/2012   LDLCALC 94 04/10/2017   ALT 36 (H) 04/10/2017   AST 28 04/10/2017   NA 137 04/10/2017   K 4.4 04/10/2017   CL 105 04/10/2017   CREATININE 0.86 04/10/2017   BUN 15 04/10/2017   CO2 23 04/10/2017   TSH 2.61 04/10/2017    Lab Results  Component Value Date   TSH 2.61 04/10/2017   Lab Results  Component Value Date   WBC 6.9 04/10/2017   HGB 12.5 04/10/2017   HCT 37.2 04/10/2017   MCV 88.5 04/10/2017   PLT 327.0 04/10/2017   Lab Results  Component Value Date   NA 137 04/10/2017   K 4.4 04/10/2017   CO2 23 04/10/2017   GLUCOSE 99 04/10/2017   BUN 15 04/10/2017   CREATININE 0.86 04/10/2017   BILITOT 0.3 04/10/2017   ALKPHOS 53 04/10/2017   AST 28 04/10/2017   ALT 36 (H) 04/10/2017   PROT 6.9 04/10/2017  ALBUMIN 4.3 04/10/2017   CALCIUM 9.2 04/10/2017   GFR 82.42 04/10/2017   Lab Results    Component Value Date   CHOL 171 04/10/2017   Lab Results  Component Value Date   HDL 58.60 04/10/2017   Lab Results  Component Value Date   LDLCALC 94 04/10/2017   Lab Results  Component Value Date   TRIG 90.0 04/10/2017   Lab Results  Component Value Date   CHOLHDL 3 04/10/2017   No results found for: HGBA1C       Assessment & Plan:   Problem List Items Addressed This Visit    Mixed hyperlipidemia    Check in 2018 was normal. Maintain heart healthy diet such as DASH or MIND or Satiety increase exercise, avoid trans fats, consider a krill oil cap daily      Obesity    Encouraged DASH or MIND or Satiety diet, decrease po intake and increase exercise as tolerated. Needs 7-8 hours of sleep nightly. Avoid trans fats, eat small, frequent meals every 4-5 hours with lean proteins, complex carbs and healthy fats and minimize simple carbs      Preventative health care    Patient encouraged to maintain heart healthy diet, regular exercise, adequate sleep. Consider daily probiotics. Take medications as prescribed      ADD (attention deficit disorder)    Doing well on Vyvanse 70 mg but frustrated with the paperwork process to get it approved with the drug company every year.          I have discontinued Kecia Langenberg's amoxicillin and amoxicillin-clavulanate. I am also having her maintain her valACYclovir, methocarbamol, meloxicam, fluconazole, rizatriptan, citalopram, lisdexamfetamine, lisdexamfetamine, lisdexamfetamine, and desogestrel-ethinyl estradiol.  Meds ordered this encounter  Medications  . desogestrel-ethinyl estradiol (VIORELE) 0.15-0.02/0.01 MG (21/5) tablet    Sig: Take 1 tablet by mouth daily.    Dispense:  90 tablet    Refill:  3    CMA served as scribe during this visit. History, Physical and Plan performed by medical provider. Documentation and orders reviewed and attested to.  Penni Homans, MD

## 2018-04-14 NOTE — Assessment & Plan Note (Signed)
Doing well on Vyvanse 70 mg but frustrated with the paperwork process to get it approved with the drug company every year.

## 2018-04-14 NOTE — Patient Instructions (Signed)
Preventive Care 18-39 Years, Female Preventive care refers to lifestyle choices and visits with your health care provider that can promote health and wellness. What does preventive care include?  A yearly physical exam. This is also called an annual well check.  Dental exams once or twice a year.  Routine eye exams. Ask your health care provider how often you should have your eyes checked.  Personal lifestyle choices, including: ? Daily care of your teeth and gums. ? Regular physical activity. ? Eating a healthy diet. ? Avoiding tobacco and drug use. ? Limiting alcohol use. ? Practicing safe sex. ? Taking vitamin and mineral supplements as recommended by your health care provider. What happens during an annual well check? The services and screenings done by your health care provider during your annual well check will depend on your age, overall health, lifestyle risk factors, and family history of disease. Counseling Your health care provider may ask you questions about your:  Alcohol use.  Tobacco use.  Drug use.  Emotional well-being.  Home and relationship well-being.  Sexual activity.  Eating habits.  Work and work Statistician.  Method of birth control.  Menstrual cycle.  Pregnancy history.  Screening You may have the following tests or measurements:  Height, weight, and BMI.  Diabetes screening. This is done by checking your blood sugar (glucose) after you have not eaten for a while (fasting).  Blood pressure.  Lipid and cholesterol levels. These may be checked every 5 years starting at age 66.  Skin check.  Hepatitis C blood test.  Hepatitis B blood test.  Sexually transmitted disease (STD) testing.  BRCA-related cancer screening. This may be done if you have a family history of breast, ovarian, tubal, or peritoneal cancers.  Pelvic exam and Pap test. This may be done every 3 years starting at age 40. Starting at age 59, this may be done every 5  years if you have a Pap test in combination with an HPV test.  Discuss your test results, treatment options, and if necessary, the need for more tests with your health care provider. Vaccines Your health care provider may recommend certain vaccines, such as:  Influenza vaccine. This is recommended every year.  Tetanus, diphtheria, and acellular pertussis (Tdap, Td) vaccine. You may need a Td booster every 10 years.  Varicella vaccine. You may need this if you have not been vaccinated.  HPV vaccine. If you are 69 or younger, you may need three doses over 6 months.  Measles, mumps, and rubella (MMR) vaccine. You may need at least one dose of MMR. You may also need a second dose.  Pneumococcal 13-valent conjugate (PCV13) vaccine. You may need this if you have certain conditions and were not previously vaccinated.  Pneumococcal polysaccharide (PPSV23) vaccine. You may need one or two doses if you smoke cigarettes or if you have certain conditions.  Meningococcal vaccine. One dose is recommended if you are age 27-21 years and a first-year college student living in a residence hall, or if you have one of several medical conditions. You may also need additional booster doses.  Hepatitis A vaccine. You may need this if you have certain conditions or if you travel or work in places where you may be exposed to hepatitis A.  Hepatitis B vaccine. You may need this if you have certain conditions or if you travel or work in places where you may be exposed to hepatitis B.  Haemophilus influenzae type b (Hib) vaccine. You may need this if  you have certain risk factors.  Talk to your health care provider about which screenings and vaccines you need and how often you need them. This information is not intended to replace advice given to you by your health care provider. Make sure you discuss any questions you have with your health care provider. Document Released: 11/26/2001 Document Revised: 06/19/2016  Document Reviewed: 08/01/2015 Elsevier Interactive Patient Education  [employer].

## 2018-04-14 NOTE — Assessment & Plan Note (Signed)
Encouraged DASH or MIND or Satiety diet, decrease po intake and increase exercise as tolerated. Needs 7-8 hours of sleep nightly. Avoid trans fats, eat small, frequent meals every 4-5 hours with lean proteins, complex carbs and healthy fats and minimize simple carbs

## 2018-04-14 NOTE — Assessment & Plan Note (Signed)
Check in 2018 was normal. Maintain heart healthy diet such as DASH or MIND or Satiety increase exercise, avoid trans fats, consider a krill oil cap daily

## 2018-04-14 NOTE — Telephone Encounter (Signed)
Refills done yesterday

## 2018-04-21 NOTE — Telephone Encounter (Signed)
Completed via fax/SLS

## 2018-05-16 ENCOUNTER — Encounter: Payer: Self-pay | Admitting: Family Medicine

## 2018-06-19 ENCOUNTER — Encounter: Payer: Self-pay | Admitting: Family Medicine

## 2018-06-19 DIAGNOSIS — F988 Other specified behavioral and emotional disorders with onset usually occurring in childhood and adolescence: Secondary | ICD-10-CM

## 2018-06-19 MED ORDER — LISDEXAMFETAMINE DIMESYLATE 70 MG PO CAPS: 70.0000 mg | ORAL_CAPSULE | Freq: Every day | ORAL | 0 refills | Status: DC

## 2018-06-19 NOTE — Telephone Encounter (Signed)
Patient has RX for September at pharmacy. I did let her know to check with them first. I have set the next three months for her    No UDS, no Contract

## 2018-06-23 ENCOUNTER — Other Ambulatory Visit: Payer: Self-pay | Admitting: Family Medicine

## 2018-06-23 MED ORDER — CITALOPRAM HYDROBROMIDE 10 MG PO TABS: 10.0000 mg | ORAL_TABLET | Freq: Every day | ORAL | 1 refills | Status: DC

## 2018-06-23 NOTE — Telephone Encounter (Signed)
Dr Charlett Blake -- please advise pt's question about how to taper Citalopram or if she should come off citalopram and advise?

## 2018-07-13 ENCOUNTER — Encounter: Payer: Self-pay | Admitting: Family Medicine

## 2018-07-13 ENCOUNTER — Other Ambulatory Visit: Payer: Self-pay | Admitting: Family Medicine

## 2018-07-13 DIAGNOSIS — F988 Other specified behavioral and emotional disorders with onset usually occurring in childhood and adolescence: Secondary | ICD-10-CM

## 2018-07-21 ENCOUNTER — Other Ambulatory Visit: Payer: Self-pay | Admitting: Family Medicine

## 2018-08-17 ENCOUNTER — Other Ambulatory Visit: Payer: Self-pay | Admitting: Family Medicine

## 2018-08-17 ENCOUNTER — Encounter: Payer: Self-pay | Admitting: Family Medicine

## 2018-08-17 MED ORDER — CITALOPRAM HYDROBROMIDE 20 MG PO TABS: 20.0000 mg | ORAL_TABLET | Freq: Every day | ORAL | 1 refills | Status: DC

## 2018-08-24 ENCOUNTER — Telehealth: Payer: Self-pay | Admitting: Family Medicine

## 2018-08-24 NOTE — Telephone Encounter (Signed)
Copied from [place] [id number]. Topic: Quick Communication - See Telephone Encounter >> Aug 24, 2018  4:21 PM Genella Rife H wrote: CRM for notification. See Telephone encounter for: 08/24/18.  Left vm appt on 10/15/17 needs to be rescheduled per pcp

## 2018-09-03 ENCOUNTER — Encounter: Payer: Self-pay | Admitting: Family Medicine

## 2018-09-03 ENCOUNTER — Other Ambulatory Visit: Payer: Self-pay | Admitting: Family Medicine

## 2018-09-04 MED ORDER — VALACYCLOVIR HCL 1 G PO TABS: 2000.0000 mg | ORAL_TABLET | Freq: Two times a day (BID) | ORAL | 5 refills | Status: DC

## 2018-09-23 ENCOUNTER — Other Ambulatory Visit: Payer: Self-pay | Admitting: Family Medicine

## 2018-09-23 ENCOUNTER — Encounter: Payer: Self-pay | Admitting: Family Medicine

## 2018-09-23 MED ORDER — FLUCONAZOLE 150 MG PO TABS: 150.0000 mg | ORAL_TABLET | ORAL | 1 refills | Status: DC

## 2018-10-15 ENCOUNTER — Other Ambulatory Visit: Payer: Self-pay | Admitting: Family Medicine

## 2018-10-15 ENCOUNTER — Ambulatory Visit: Payer: BLUE CROSS/BLUE SHIELD | Admitting: Family Medicine

## 2018-10-15 ENCOUNTER — Telehealth: Payer: Self-pay | Admitting: Family Medicine

## 2018-10-15 DIAGNOSIS — F988 Other specified behavioral and emotional disorders with onset usually occurring in childhood and adolescence: Secondary | ICD-10-CM

## 2018-10-15 MED ORDER — LISDEXAMFETAMINE DIMESYLATE 70 MG PO CAPS: 70.0000 mg | ORAL_CAPSULE | Freq: Every day | ORAL | 0 refills | Status: DC

## 2018-10-15 NOTE — Telephone Encounter (Signed)
Patient came into the office today after trying to reschedule since you are not in the office. Patient needed a refill of Vyvanse 70 mg due for a refill today. Pharmacy is [facility] [address]. Phone number verified. Please advise.

## 2018-10-15 NOTE — Telephone Encounter (Signed)
Sent Vyvanse to pharmacy please let her know

## 2018-10-15 NOTE — Telephone Encounter (Signed)
Attempted to notify pt and left detailed message on voicemail that Rx has been sent and to check with pharmacy this afternoon to pick up RX.

## 2018-10-19 ENCOUNTER — Ambulatory Visit: Payer: BLUE CROSS/BLUE SHIELD | Admitting: Family Medicine

## 2018-10-19 ENCOUNTER — Encounter: Payer: Self-pay | Admitting: Family Medicine

## 2018-10-19 DIAGNOSIS — F338 Other recurrent depressive disorders: Secondary | ICD-10-CM

## 2018-10-19 DIAGNOSIS — G43809 Other migraine, not intractable, without status migrainosus: Secondary | ICD-10-CM

## 2018-10-19 DIAGNOSIS — M542 Cervicalgia: Secondary | ICD-10-CM | POA: Diagnosis not present

## 2018-10-19 DIAGNOSIS — F988 Other specified behavioral and emotional disorders with onset usually occurring in childhood and adolescence: Secondary | ICD-10-CM

## 2018-10-19 MED ORDER — LISDEXAMFETAMINE DIMESYLATE 70 MG PO CAPS: 70.0000 mg | ORAL_CAPSULE | Freq: Every day | ORAL | 0 refills | Status: DC

## 2018-10-19 MED ORDER — CITALOPRAM HYDROBROMIDE 20 MG PO TABS: 30.0000 mg | ORAL_TABLET | Freq: Every day | ORAL | 1 refills | Status: DC

## 2018-10-19 MED ORDER — METHOCARBAMOL 500 MG PO TABS: 500.0000 mg | ORAL_TABLET | Freq: Two times a day (BID) | ORAL | 0 refills | Status: DC

## 2018-10-19 NOTE — Assessment & Plan Note (Addendum)
Tried to stop the Citalopram but she felt worse so she went back up to 20 mg daily.

## 2018-10-19 NOTE — Patient Instructions (Signed)
Seasonal Affective Disorder Seasonal affective disorder (SAD) is a type of depression. It is when you feel depressed at specific times of the year. SAD is most common during late fall and winter when the days are shorter and most people spend less time outdoors. This is why SAD is also known as the "winter blues." SAD occurs less commonly in the spring or summer. SAD can be mild to severe, and it can interfere with work, school, relationships, and normal daily activities. What are the causes? The cause of this condition is not known. It may be related to changes in brain chemistry that are caused by having less exposure to daylight. What increases the risk? You are more likely to develop this condition if:  You are female.  You live far [place] or far [place] of the equator. These areas get less sunlight and have longer winter seasons.  You have a personal history of depression or bipolar disorder.  You have a family history of mental health conditions. What are the signs or symptoms? Symptoms of this condition include:  Depressed mood, which may involve: ? Feeling sad or teary. ? Having crying spells.  Irritability.  Trouble sleeping, or sleeping more than usual.  Loss of interest in activities that you usually enjoy.  Feelings of guilt or worthlessness.  Restlessness or loss of energy.  Difficulty concentrating, remembering, or making decisions.  Significant change in appetite or weight.  Thinking about self-harm or attempting suicide. Symptoms associated with the winter pattern of SAD include:  Overeating or craving sweet foods.  Weight gain.  Avoiding social situations (social withdrawal), or feeling like "hibernating."  Sleeping more than usual. Symptoms associated with the less common summer pattern of SAD include:  Loss of appetite.  Weight loss.  Trouble sleeping.  Episodes of violent behavior (in severe cases). How is this diagnosed? This condition is  usually diagnosed through an assessment with your health care provider. You will be asked about your moods, thoughts, and behaviors. You will also be asked about your medical history, any major life changes, and any medicines and substances that you use. You may have a physical exam and blood tests to rule out other possible causes of your symptoms. You may be referred to a mental health specialist for more evaluation. How is this treated? Treatment for this condition may include:  Light therapy. This therapy involves sitting in front of a light source for 15-30 minutes every day. The light source may be: ? A light box. ? A dawn simulator or sunrise clock. This is a timer-activated light source that copies the sunrise by slowly becoming brighter. This can help to activate your body's internal clock.  Antidepressant medicine.  Cognitive behavioral therapy (CBT). CBT is a form of talk therapy that helps to identify and change negative thoughts that are associated with SAD.  Changes to your dietary, exercise, or sleeping habits. A healthy lifestyle may help to prevent or relieve symptoms. Follow these instructions at home: Medicines  Take over-the-counter and prescription medicines only as told by your healthcare provider.  If you are taking antidepressant medicines, ask your health care provider what side effects you should be aware of.  Talk with your health care provider before you start taking any new prescription or over-the-counter medicines, herbs, or supplements. Lifestyle  Eat a healthy diet that includes fruits and vegetables, whole grains, and lean proteins.  Get plenty of sleep. To improve your sleep, make sure you: ? Keep your bedroom dark and cool. ?  Go to sleep and wake up at about the same time every day. ? Do not keep screens (such as a TV or smartphone) in your bedroom. Limit your screen time starting a few hours before bedtime.  Exercise regularly.  Limit alcohol and  caffeine as told by your health care provider. General instructions  Make your home and work environment as sunny or bright as possible. Open window blinds and move furniture closer to windows.  Spend as much time outside as possible.  Use light therapy for 15-30 minutes every day, or as often as directed.  Attend CBT therapy sessions as directed.  Keep all follow-up visits as told by your health care provider and therapist. This is important. Contact a health care provider if:  Your symptoms do not get better or they get worse.  You have trouble taking care of yourself.  You are using drugs or alcohol to cope with your symptoms.  You have side effects from medicines. Get help right away if:  You have thoughts about hurting yourself or others. If you ever feel like you may hurt yourself or others, or have thoughts about taking your own life, get help right away. You can go to your nearest emergency department or call:  Your local emergency services (911 in the [place]  A suicide crisis helpline, such as the [facility] at 207-058-2079. This is open 24 hours a day. Summary  Seasonal affective disorder (SAD) is a type of depression that is associated with specific times of the year (usually fall and winter).  This condition may be treated with light therapy, talk therapy, and antidepressant medicines.  To help treat your condition, take good care of yourself and make home and work as sunny and bright as possible.  Seek help right away if you have thoughts about hurting yourself or others. This information is not intended to replace advice given to you by your health care provider. Make sure you discuss any questions you have with your health care provider. Document Released: 06/25/2001 Document Revised: 06/12/2017 Document Reviewed: 06/12/2017 Elsevier Interactive Patient Education  2019 [employer].

## 2018-10-21 NOTE — Progress Notes (Signed)
Subjective:    Patient ID: Bonnie Lewis, female    DOB: January 10, 1986, 33 y.o.   MRN: [id number]  No chief complaint on file.   HPI Patient is in today for follow up. She feels well today. No recent febrile illness or acute hospitalizations. She is reporting she is tolerating her meds and feels her ADD meds are helpful. Denies CP/palp/SOB/HA/congestion/fevers/GI or GU c/o. Taking meds as prescribed. She tried stopping Citalopram and she felt worse so she went back on.   Past Medical History:  Diagnosis Date  . Acne 04/14/2015  . ADD (attention deficit disorder)   . Anemia 09/12/2011  . Bronchitis 08/11/2011  . Cervical cancer ([place]) 10/19/2012  . Cervical cancer screening 10/19/2012  . Dyslipidemia 10/20/2012  . Dysmenorrhea 10/20/2012  . Insomnia 09/12/2011  . LLQ pain 08/11/2011  . Migraine 05/14/2013  . Obesity 06/27/2010   Qualifier: Diagnosis of  By: Domingo Cocking RMA, Christy    . Preventative health care 09/16/2011  . Seasonal affective disorder ([place]) 10/19/2012  . Vaginitis 10/19/2012  . Viral pharyngitis 01/10/2016    No past surgical history on file.  Family History  Problem Relation Age of Onset  . Asthma Father   . Depression Father        and anxiety  . Allergies Father   . Irritable bowel syndrome Father   . Dementia Father   . Depression Sister   . Irritable bowel syndrome Sister   . Migraines Sister   . Depression Brother   . Allergies Brother   . Depression Maternal Grandmother        Anxiety  . Hypertension Maternal Grandmother   . ADD / ADHD Maternal Grandmother   . Menstrual problems Maternal Grandmother        heavy bleeding requiring hysterectomy  . Depression Paternal Grandmother   . Cardiomyopathy Paternal Grandmother   . Menstrual problems Paternal Grandmother   . Hyperlipidemia Mother   . Anemia Sister   . Coronary artery disease Paternal Grandfather   . Cancer Paternal Aunt   . Brain cancer Maternal Aunt     Social History   Socioeconomic History  .  Marital status: Single    Spouse name: Not on file  . Number of children: Not on file  . Years of education: Not on file  . Highest education level: Not on file  Occupational History  . Not on file  Social Needs  . Financial resource strain: Not on file  . Food insecurity:    Worry: Not on file    Inability: Not on file  . Transportation needs:    Medical: Not on file    Non-medical: Not on file  Tobacco Use  . Smoking status: Never Smoker  . Smokeless tobacco: Never Used  Substance and Sexual Activity  . Alcohol use: Yes    Comment: Sociallly   . Drug use: No  . Sexual activity: Yes    Comment: lives at home to help her mother with her father who has early onset Alzheimer's  Lifestyle  . Physical activity:    Days per week: Not on file    Minutes per session: Not on file  . Stress: Not on file  Relationships  . Social connections:    Talks on phone: Not on file    Gets together: Not on file    Attends religious service: Not on file    Active member of club or organization: Not on file    Attends meetings of clubs  or organizations: Not on file    Relationship status: Not on file  . Intimate partner violence:    Fear of current or ex partner: Not on file    Emotionally abused: Not on file    Physically abused: Not on file    Forced sexual activity: Not on file  Other Topics Concern  . Not on file  Social History Narrative  . Not on file    Outpatient Medications Prior to Visit  Medication Sig Dispense Refill  . desogestrel-ethinyl estradiol (VIORELE) 0.15-0.02/0.01 MG (21/5) tablet Take 1 tablet by mouth daily. 90 tablet 3  . fluconazole (DIFLUCAN) 150 MG tablet Take 1 tablet (150 mg total) by mouth once a week. 2 tablet 1  . meloxicam (MOBIC) 15 MG tablet Take 1 tablet (15 mg total) by mouth daily. 30 tablet 0  . rizatriptan (MAXALT) 10 MG tablet TAKE 1 TABLET (10 MG TOTAL) BY MOUTH AS NEEDED FOR MIGRAINE. MAY REPEAT IN 2 HOURS IF NEEDED 10 tablet 3  .  valACYclovir (VALTREX) 1000 MG tablet Take 2 tablets (2,000 mg total) by mouth 2 (two) times daily. 120 tablet 5  . citalopram (CELEXA) 20 MG tablet Take 1 tablet (20 mg total) by mouth daily. 90 tablet 1  . lisdexamfetamine (VYVANSE) 70 MG capsule Take 1 capsule (70 mg total) by mouth daily. November 2019 30 capsule 0  . lisdexamfetamine (VYVANSE) 70 MG capsule Take 1 capsule (70 mg total) by mouth daily. October 2019 30 capsule 0  . lisdexamfetamine (VYVANSE) 70 MG capsule Take 1 capsule (70 mg total) by mouth daily. January 2020 rx 30 capsule 0  . methocarbamol (ROBAXIN) 500 MG tablet Take 1 tablet (500 mg total) by mouth 2 (two) times daily. 60 tablet 0   No facility-administered medications prior to visit.     Allergies  Allergen Reactions  . Sulfonamide Derivatives     Review of Systems  Constitutional: Negative for fever and malaise/fatigue.  HENT: Negative for congestion.   Eyes: Negative for blurred vision.  Respiratory: Negative for shortness of breath.   Cardiovascular: Negative for chest pain, palpitations and leg swelling.  Gastrointestinal: Negative for abdominal pain, blood in stool and nausea.  Genitourinary: Negative for dysuria and frequency.  Musculoskeletal: Negative for falls.  Skin: Negative for rash.  Neurological: Negative for dizziness, loss of consciousness and headaches.  Endo/Heme/Allergies: Negative for environmental allergies.  Psychiatric/Behavioral: Negative for depression. The patient is not nervous/anxious.        Objective:    Physical Exam Vitals signs and nursing note reviewed.  Constitutional:      General: She is not in acute distress.    Appearance: She is well-developed.  HENT:     Head: Normocephalic and atraumatic.     Nose: Nose normal.  Eyes:     General:        Right eye: No discharge.        Left eye: No discharge.  Neck:     Musculoskeletal: Normal range of motion and neck supple.  Cardiovascular:     Rate and Rhythm:  Normal rate and regular rhythm.     Heart sounds: No murmur.  Pulmonary:     Effort: Pulmonary effort is normal.     Breath sounds: Normal breath sounds.  Abdominal:     General: Bowel sounds are normal.     Palpations: Abdomen is soft.     Tenderness: There is no abdominal tenderness.  Skin:    General: Skin is warm  and dry.  Neurological:     Mental Status: She is alert and oriented to person, place, and time.     BP 122/84 (BP Location: Left Arm, Patient Position: Sitting, Cuff Size: Normal)   Pulse 87   Temp 98.1 F (36.7 C) (Oral)   Resp 18   Wt 219 lb 12.8 oz (99.7 kg)   SpO2 98%   BMI 37.73 kg/m  Wt Readings from Last 3 Encounters:  10/19/18 219 lb 12.8 oz (99.7 kg)  04/14/18 217 lb 9.6 oz (98.7 kg)  10/16/17 228 lb (103.4 kg)     Lab Results  Component Value Date   WBC 6.9 04/10/2017   HGB 12.5 04/10/2017   HCT 37.2 04/10/2017   PLT 327.0 04/10/2017   GLUCOSE 99 04/10/2017   CHOL 171 04/10/2017   TRIG 90.0 04/10/2017   HDL 58.60 04/10/2017   LDLDIRECT 110.1 10/19/2012   LDLCALC 94 04/10/2017   ALT 36 (H) 04/10/2017   AST 28 04/10/2017   NA 137 04/10/2017   K 4.4 04/10/2017   CL 105 04/10/2017   CREATININE 0.86 04/10/2017   BUN 15 04/10/2017   CO2 23 04/10/2017   TSH 2.61 04/10/2017    Lab Results  Component Value Date   TSH 2.61 04/10/2017   Lab Results  Component Value Date   WBC 6.9 04/10/2017   HGB 12.5 04/10/2017   HCT 37.2 04/10/2017   MCV 88.5 04/10/2017   PLT 327.0 04/10/2017   Lab Results  Component Value Date   NA 137 04/10/2017   K 4.4 04/10/2017   CO2 23 04/10/2017   GLUCOSE 99 04/10/2017   BUN 15 04/10/2017   CREATININE 0.86 04/10/2017   BILITOT 0.3 04/10/2017   ALKPHOS 53 04/10/2017   AST 28 04/10/2017   ALT 36 (H) 04/10/2017   PROT 6.9 04/10/2017   ALBUMIN 4.3 04/10/2017   CALCIUM 9.2 04/10/2017   GFR 82.42 04/10/2017   Lab Results  Component Value Date   CHOL 171 04/10/2017   Lab Results  Component Value  Date   HDL 58.60 04/10/2017   Lab Results  Component Value Date   LDLCALC 94 04/10/2017   Lab Results  Component Value Date   TRIG 90.0 04/10/2017   Lab Results  Component Value Date   CHOLHDL 3 04/10/2017   No results found for: HGBA1C     Assessment & Plan:   Problem List Items Addressed This Visit    Seasonal affective disorder ([place])    Tried to stop the Citalopram but she felt worse so she went back up to 20 mg daily.       Relevant Medications   citalopram (CELEXA) 20 MG tablet   Migraine    Encouraged increased hydration, 64 ounces of clear fluids daily. Minimize alcohol and caffeine. Eat small frequent meals with lean proteins and complex carbs. Avoid high and low blood sugars. Get adequate sleep      Relevant Medications   methocarbamol (ROBAXIN) 500 MG tablet   citalopram (CELEXA) 20 MG tablet   ADD (attention deficit disorder)    Medication refilled. UDS and contract UTD.      Relevant Medications   lisdexamfetamine (VYVANSE) 70 MG capsule   lisdexamfetamine (VYVANSE) 70 MG capsule   lisdexamfetamine (VYVANSE) 70 MG capsule    Other Visit Diagnoses    Acute neck pain       Relevant Medications   methocarbamol (ROBAXIN) 500 MG tablet      I have changed Vicente Males  [place]'s lisdexamfetamine, lisdexamfetamine, citalopram, and lisdexamfetamine. I am also having her maintain her meloxicam, rizatriptan, desogestrel-ethinyl estradiol, valACYclovir, fluconazole, and methocarbamol.  Meds ordered this encounter  Medications  . lisdexamfetamine (VYVANSE) 70 MG capsule    Sig: Take 1 capsule (70 mg total) by mouth daily. March 2020    Dispense:  30 capsule    Refill:  0  . lisdexamfetamine (VYVANSE) 70 MG capsule    Sig: Take 1 capsule (70 mg total) by mouth daily. February 2020    Dispense:  30 capsule    Refill:  0  . methocarbamol (ROBAXIN) 500 MG tablet    Sig: Take 1 tablet (500 mg total) by mouth 2 (two) times daily.    Dispense:  60 tablet    Refill:  0   . citalopram (CELEXA) 20 MG tablet    Sig: Take 1.5 tablets (30 mg total) by mouth daily.    Dispense:  135 tablet    Refill:  1  . lisdexamfetamine (VYVANSE) 70 MG capsule    Sig: Take 1 capsule (70 mg total) by mouth daily. April 2020 rx    Dispense:  30 capsule    Refill:  0    Penni Homans, MD

## 2018-10-21 NOTE — Assessment & Plan Note (Signed)
Medication refilled. UDS and contract UTD.

## 2018-10-21 NOTE — Assessment & Plan Note (Signed)
Encouraged increased hydration, 64 ounces of clear fluids daily. Minimize alcohol and caffeine. Eat small frequent meals with lean proteins and complex carbs. Avoid high and low blood sugars. Get adequate sleep

## 2018-11-09 ENCOUNTER — Encounter: Payer: Self-pay | Admitting: Family Medicine

## 2018-11-09 DIAGNOSIS — J018 Other acute sinusitis: Secondary | ICD-10-CM | POA: Diagnosis not present

## 2018-11-24 ENCOUNTER — Ambulatory Visit: Payer: BLUE CROSS/BLUE SHIELD | Admitting: Family Medicine

## 2019-02-18 ENCOUNTER — Other Ambulatory Visit: Payer: Self-pay | Admitting: Family Medicine

## 2019-02-18 ENCOUNTER — Encounter: Payer: Self-pay | Admitting: Family Medicine

## 2019-02-18 DIAGNOSIS — F988 Other specified behavioral and emotional disorders with onset usually occurring in childhood and adolescence: Secondary | ICD-10-CM

## 2019-02-19 ENCOUNTER — Other Ambulatory Visit: Payer: Self-pay

## 2019-02-19 DIAGNOSIS — F988 Other specified behavioral and emotional disorders with onset usually occurring in childhood and adolescence: Secondary | ICD-10-CM

## 2019-02-19 NOTE — Telephone Encounter (Signed)
Requesting:vyvanse 3 mths  Contract:yes UDS:n/a Last OV:10/19/18 Next OV:04/05/19 Last Refill:10/19/18  feb,march,april, #[id number] Database:   Please advise

## 2019-02-21 MED ORDER — LISDEXAMFETAMINE DIMESYLATE 70 MG PO CAPS: 70.0000 mg | ORAL_CAPSULE | Freq: Every day | ORAL | 0 refills | Status: DC

## 2019-03-25 DIAGNOSIS — H04123 Dry eye syndrome of bilateral lacrimal glands: Secondary | ICD-10-CM | POA: Diagnosis not present

## 2019-04-05 ENCOUNTER — Encounter: Payer: Self-pay | Admitting: Family Medicine

## 2019-04-05 ENCOUNTER — Telehealth: Payer: Self-pay | Admitting: Family Medicine

## 2019-04-05 ENCOUNTER — Other Ambulatory Visit: Payer: Self-pay

## 2019-04-05 ENCOUNTER — Ambulatory Visit (INDEPENDENT_AMBULATORY_CARE_PROVIDER_SITE_OTHER): Payer: BC Managed Care – PPO | Admitting: Family Medicine

## 2019-04-05 DIAGNOSIS — F988 Other specified behavioral and emotional disorders with onset usually occurring in childhood and adolescence: Secondary | ICD-10-CM

## 2019-04-05 DIAGNOSIS — Z0000 Encounter for general adult medical examination without abnormal findings: Secondary | ICD-10-CM

## 2019-04-05 DIAGNOSIS — E782 Mixed hyperlipidemia: Secondary | ICD-10-CM

## 2019-04-05 DIAGNOSIS — G5600 Carpal tunnel syndrome, unspecified upper limb: Secondary | ICD-10-CM | POA: Insufficient documentation

## 2019-04-05 DIAGNOSIS — G5603 Carpal tunnel syndrome, bilateral upper limbs: Secondary | ICD-10-CM

## 2019-04-05 DIAGNOSIS — E669 Obesity, unspecified: Secondary | ICD-10-CM

## 2019-04-05 DIAGNOSIS — H029 Unspecified disorder of eyelid: Secondary | ICD-10-CM | POA: Insufficient documentation

## 2019-04-05 MED ORDER — LISDEXAMFETAMINE DIMESYLATE 70 MG PO CAPS: 70.0000 mg | ORAL_CAPSULE | Freq: Every day | ORAL | 0 refills | Status: DC

## 2019-04-05 NOTE — Assessment & Plan Note (Signed)
Likely a cholesterol deposit or small pus deposit.encouraged to debride with H2O2 and gauze then apply Benzoyl Peroxide and consider a referral to plastics if patient is interested

## 2019-04-05 NOTE — Assessment & Plan Note (Signed)
Encouraged heart healthy diet, increase exercise, avoid trans fats, consider a krill oil cap daily 

## 2019-04-05 NOTE — Assessment & Plan Note (Signed)
Refills given on Vyvanse which is working well. Paperwork is completed for [facility] to get patient assistant

## 2019-04-05 NOTE — Telephone Encounter (Signed)
LVM for patient to call back to schedule 3 month f/u appointment around 10/05/19.

## 2019-04-05 NOTE — Assessment & Plan Note (Signed)
Patient encouraged to maintain heart healthy diet, regular exercise, adequate sleep. Consider daily probiotics. Take medications as prescribed 

## 2019-04-05 NOTE — Patient Instructions (Addendum)
Ice, Lidocaine, splinting  Carpal Tunnel Syndrome  Carpal tunnel syndrome is a condition that causes pain in your hand and arm. The carpal tunnel is a narrow area located on the palm side of your wrist. Repeated wrist motion or certain diseases may cause swelling within the tunnel. This swelling pinches the main nerve in the wrist (median nerve). What are the causes? This condition may be caused by:  Repeated wrist motions.  Wrist injuries.  Arthritis.  A cyst or tumor in the carpal tunnel.  Fluid buildup during pregnancy. Sometimes the cause of this condition is not known. What increases the risk? The following factors may make you more likely to develop this condition:  Having a job, such as being a Research scientist (life sciences), that requires you to repeatedly move your wrist in the same motion.  Being a woman.  Having certain conditions, such as: ? Diabetes. ? Obesity. ? An underactive thyroid (hypothyroidism). ? Kidney failure. What are the signs or symptoms? Symptoms of this condition include:  A tingling feeling in your fingers, especially in your thumb, index, and middle fingers.  Tingling or numbness in your hand.  An aching feeling in your entire arm, especially when your wrist and elbow are bent for a long time.  Wrist pain that goes up your arm to your shoulder.  Pain that goes down into your palm or fingers.  A weak feeling in your hands. You may have trouble grabbing and holding items. Your symptoms may feel worse during the night. How is this diagnosed? This condition is diagnosed with a medical history and physical exam. You may also have tests, including:  Electromyogram (EMG). This test measures electrical signals sent by your nerves into the muscles.  Nerve conduction study. This test measures how well electrical signals pass through your nerves.  Imaging tests, such as X-rays, ultrasound, and MRI. These tests check for possible causes of your condition.  How is this treated? This condition may be treated with:  Lifestyle changes. It is important to stop or change the activity that caused your condition.  Doing exercise and activities to strengthen your muscles and bones (physical therapy).  Learning how to use your hand again after diagnosis (occupational therapy).  Medicines for pain and inflammation. This may include medicine that is injected into your wrist.  A wrist splint.  Surgery. Follow these instructions at home: If you have a splint:  Wear the splint as told by your health care provider. Remove it only as told by your health care provider.  Loosen the splint if your fingers tingle, become numb, or turn cold and blue.  Keep the splint clean.  If the splint is not waterproof: ? Do not let it get wet. ? Cover it with a watertight covering when you take a bath or shower. Managing pain, stiffness, and swelling   If directed, put ice on the painful area: ? If you have a removable splint, remove it as told by your health care provider. ? Put ice in a plastic bag. ? Place a towel between your skin and the bag. ? Leave the ice on for 20 minutes, 2-3 times per day. General instructions  Take over-the-counter and prescription medicines only as told by your health care provider.  Rest your wrist from any activity that may be causing your pain. If your condition is work related, talk with your employer about changes that can be made, such as getting a wrist pad to use while typing.  Do  any exercises as told by your health care provider, physical therapist, or occupational therapist.  Keep all follow-up visits as told by your health care provider. This is important. Contact a health care provider if:  You have new symptoms.  Your pain is not controlled with medicines.  Your symptoms get worse. Get help right away if:  You have severe numbness or tingling in your wrist or hand. Summary  Carpal tunnel syndrome is a  condition that causes pain in your hand and arm.  It is usually caused by repeated wrist motions.  Lifestyle changes and medicines are used to treat carpal tunnel syndrome. Surgery may be recommended.  Follow your health care provider's instructions about wearing a splint, resting from activity, keeping follow-up visits, and calling for help. This information is not intended to replace advice given to you by your health care provider. Make sure you discuss any questions you have with your health care provider. Document Released: 09/27/2000 Document Revised: 02/06/2018 Document Reviewed: 02/06/2018 Elsevier Interactive Patient Education  [employer]. ?  ? Avoiding tobacco and drug use. ? Limiting alcohol use. ? Practicing safe sex. ? Taking vitamin and mineral supplements as recommended by your health care provider. What happens during an annual well check? The services and screenings done by your health care provider during your annual well check will depend on your age, overall health, lifestyle risk factors, and family history of disease. Counseling Your health care provider may ask you questions about your:  Alcohol use.  Tobacco use.  Drug use.  Emotional well-being.  Home and relationship well-being.  Sexual activity.  Eating habits.  Work and work Statistician.  Method of birth control.  Menstrual cycle.  Pregnancy history. Screening You may have the following tests or measurements:  Height, weight, and BMI.  Diabetes screening. This is done by checking your blood sugar (glucose) after you have not eaten for a while (fasting).  Blood pressure.  Lipid and cholesterol levels. These may be checked every 5 years starting at age 36.  Skin check.  Hepatitis C blood test.  Hepatitis B blood test.  Sexually transmitted disease (STD) testing.  BRCA-related cancer screening. This may be done if you have a family history of breast, ovarian, tubal, or  peritoneal cancers.  Pelvic exam and Pap test. This may be done every 3 years starting at age 75. Starting at age 74, this may be done every 5 years if you have a Pap test in combination with an HPV test. Discuss your test results, treatment options, and if necessary, the need for more tests with your health care provider. Vaccines Your health care provider may recommend certain vaccines, such as:  Influenza vaccine. This is recommended every year.  Tetanus, diphtheria, and acellular pertussis (Tdap, Td) vaccine. You may need a Td booster every 10 years.  Varicella vaccine. You may need this if you have not been vaccinated.  HPV vaccine. If you are 19 or younger, you may need three doses over 6 months.  Measles, mumps, and rubella (MMR) vaccine. You may need at least one dose of MMR. You may also need a second dose.  Pneumococcal 13-valent conjugate (PCV13) vaccine. You may need this if you have certain conditions and were not previously vaccinated.  Pneumococcal polysaccharide (PPSV23) vaccine. You may need one or two doses if you smoke cigarettes or if you have certain conditions.  Meningococcal vaccine. One dose is recommended if you are age 56-21 years and a Market researcher living  in a residence hall, or if you have one of several medical conditions. You may also need additional booster doses.  Hepatitis A vaccine. You may need this if you have certain conditions or if you travel or work in places where you may be exposed to hepatitis A.  Hepatitis B vaccine. You may need this if you have certain conditions or if you travel or work in places where you may be exposed to hepatitis B.  Haemophilus influenzae type b (Hib) vaccine. You may need this if you have certain risk factors. Talk to your health care provider about which screenings and vaccines you need and how often you need them. This information is not intended to replace advice given to you by your health care  provider. Make sure you discuss any questions you have with your health care provider. Document Released: 11/26/2001 Document Revised: 05/13/2017 Document Reviewed: 08/01/2015 Elsevier Interactive Patient Education  2019 [employer].

## 2019-04-05 NOTE — Assessment & Plan Note (Signed)
Ice, Aleve, lidocaine patches and splinting. Report if worse.

## 2019-04-06 NOTE — Assessment & Plan Note (Signed)
Encouraged DASH diet, decrease po intake and increase exercise as tolerated. Needs 7-8 hours of sleep nightly. Avoid trans fats, eat small, frequent meals every 4-5 hours with lean proteins, complex carbs and healthy fats. Minimize simple carbs, consider healthy weight and wellness

## 2019-04-06 NOTE — Progress Notes (Signed)
Subjective:    Patient ID: Bonnie Lewis, female    DOB: 06/19/1986, 33 y.o.   MRN: [id number]  No chief complaint on file.   HPI Patient is in today for annual preventative exam and chronic medical concerns including back pain, obesity, ADD and hyperlipidemia. She is doing well. She is considering getting married to her partner of 3.5 years. She continues to work at a [employer], [employer]. No recent febrile illness or hospitalizations. Is trying to maintain a heart healthy diet. Vyvanse is working well without side effects. Denies CP/palp/SOB/HA/congestion/fevers/GI or GU c/o. Taking meds as prescribed  Past Medical History:  Diagnosis Date  . Acne 04/14/2015  . ADD (attention deficit disorder)   . Anemia 09/12/2011  . Bronchitis 08/11/2011  . Cervical cancer ([place]) 10/19/2012  . Cervical cancer screening 10/19/2012  . Dyslipidemia 10/20/2012  . Dysmenorrhea 10/20/2012  . Insomnia 09/12/2011  . LLQ pain 08/11/2011  . Migraine 05/14/2013  . Obesity 06/27/2010   Qualifier: Diagnosis of  By: Domingo Cocking RMA, Christy    . Preventative health care 09/16/2011  . Seasonal affective disorder ([place]) 10/19/2012  . Vaginitis 10/19/2012  . Viral pharyngitis 01/10/2016    No past surgical history on file.  Family History  Problem Relation Age of Onset  . Asthma Father   . Depression Father        and anxiety  . Allergies Father   . Irritable bowel syndrome Father   . Dementia Father   . Depression Sister   . Irritable bowel syndrome Sister   . Migraines Sister   . Depression Brother   . Allergies Brother   . Depression Maternal Grandmother        Anxiety  . Hypertension Maternal Grandmother   . ADD / ADHD Maternal Grandmother   . Menstrual problems Maternal Grandmother        heavy bleeding requiring hysterectomy  . Depression Paternal Grandmother   . Cardiomyopathy Paternal Grandmother   . Menstrual problems Paternal Grandmother   . Hyperlipidemia Mother   . Anemia Sister   . Coronary artery disease  Paternal Grandfather   . Cancer Paternal Aunt   . Brain cancer Maternal Aunt     Social History   Socioeconomic History  . Marital status: Single    Spouse name: Not on file  . Number of children: Not on file  . Years of education: Not on file  . Highest education level: Not on file  Occupational History  . Not on file  Social Needs  . Financial resource strain: Not on file  . Food insecurity    Worry: Not on file    Inability: Not on file  . Transportation needs    Medical: Not on file    Non-medical: Not on file  Tobacco Use  . Smoking status: Never Smoker  . Smokeless tobacco: Never Used  Substance and Sexual Activity  . Alcohol use: Yes    Comment: Sociallly   . Drug use: No  . Sexual activity: Yes    Comment: lives at home to help her mother with her father who has early onset Alzheimer's  Lifestyle  . Physical activity    Days per week: Not on file    Minutes per session: Not on file  . Stress: Not on file  Relationships  . Social Herbalist on phone: Not on file    Gets together: Not on file    Attends religious service: Not on file  Active member of club or organization: Not on file    Attends meetings of clubs or organizations: Not on file    Relationship status: Not on file  . Intimate partner violence    Fear of current or ex partner: Not on file    Emotionally abused: Not on file    Physically abused: Not on file    Forced sexual activity: Not on file  Other Topics Concern  . Not on file  Social History Narrative  . Not on file    Outpatient Medications Prior to Visit  Medication Sig Dispense Refill  . citalopram (CELEXA) 20 MG tablet Take 1.5 tablets (30 mg total) by mouth daily. 135 tablet 1  . desogestrel-ethinyl estradiol (VIORELE) 0.15-0.02/0.01 MG (21/5) tablet Take 1 tablet by mouth daily. 90 tablet 3  . fluconazole (DIFLUCAN) 150 MG tablet Take 1 tablet (150 mg total) by mouth once a week. 2 tablet 1  . meloxicam (MOBIC) 15  MG tablet Take 1 tablet (15 mg total) by mouth daily. 30 tablet 0  . methocarbamol (ROBAXIN) 500 MG tablet Take 1 tablet (500 mg total) by mouth 2 (two) times daily. 60 tablet 0  . rizatriptan (MAXALT) 10 MG tablet TAKE 1 TABLET (10 MG TOTAL) BY MOUTH AS NEEDED FOR MIGRAINE. MAY REPEAT IN 2 HOURS IF NEEDED 10 tablet 3  . valACYclovir (VALTREX) 1000 MG tablet Take 2 tablets (2,000 mg total) by mouth 2 (two) times daily. 120 tablet 5  . lisdexamfetamine (VYVANSE) 70 MG capsule Take 1 capsule (70 mg total) by mouth daily. July 2020 30 capsule 0  . lisdexamfetamine (VYVANSE) 70 MG capsule Take 1 capsule (70 mg total) by mouth daily. June 2020 30 capsule 0  . lisdexamfetamine (VYVANSE) 70 MG capsule Take 1 capsule (70 mg total) by mouth daily. May 2020 30 capsule 0   No facility-administered medications prior to visit.     Allergies  Allergen Reactions  . Sulfonamide Derivatives     Review of Systems  Constitutional: Negative for chills, fever and malaise/fatigue.  HENT: Negative for congestion and hearing loss.   Eyes: Negative for discharge.  Respiratory: Negative for cough, sputum production and shortness of breath.   Cardiovascular: Negative for chest pain, palpitations and leg swelling.  Gastrointestinal: Negative for abdominal pain, blood in stool, constipation, diarrhea, heartburn, nausea and vomiting.  Genitourinary: Negative for dysuria, frequency, hematuria and urgency.  Musculoskeletal: Positive for back pain and joint pain. Negative for falls and myalgias.  Skin: Negative for rash.  Neurological: Negative for dizziness, sensory change, loss of consciousness, weakness and headaches.  Endo/Heme/Allergies: Negative for environmental allergies. Does not bruise/bleed easily.  Psychiatric/Behavioral: Negative for depression and suicidal ideas. The patient is not nervous/anxious and does not have insomnia.        Objective:    Physical Exam Constitutional:      General: She is not  in acute distress.    Appearance: She is well-developed.  HENT:     Head: Normocephalic and atraumatic.  Eyes:     Conjunctiva/sclera: Conjunctivae normal.  Neck:     Musculoskeletal: Neck supple.     Thyroid: No thyromegaly.  Cardiovascular:     Rate and Rhythm: Normal rate and regular rhythm.     Heart sounds: Normal heart sounds. No murmur.  Pulmonary:     Effort: Pulmonary effort is normal. No respiratory distress.     Breath sounds: Normal breath sounds.  Abdominal:     General: Bowel sounds are normal. There  is no distension.     Palpations: Abdomen is soft. There is no mass.     Tenderness: There is no abdominal tenderness.  Lymphadenopathy:     Cervical: No cervical adenopathy.  Skin:    General: Skin is warm and dry.  Neurological:     Mental Status: She is alert and oriented to person, place, and time.  Psychiatric:        Behavior: Behavior normal.     BP 122/84 (BP Location: Left Arm, Patient Position: Sitting, Cuff Size: Normal)   Pulse 81   Temp 98.3 F (36.8 C) (Oral)   Resp 18   Ht 5\' 4"  (1.626 m)   Wt 223 lb 3.2 oz (101.2 kg)   SpO2 98%   BMI 38.31 kg/m  Wt Readings from Last 3 Encounters:  04/05/19 223 lb 3.2 oz (101.2 kg)  10/19/18 219 lb 12.8 oz (99.7 kg)  04/14/18 217 lb 9.6 oz (98.7 kg)    Diabetic Foot Exam - Simple   No data filed     Lab Results  Component Value Date   WBC 6.9 04/10/2017   HGB 12.5 04/10/2017   HCT 37.2 04/10/2017   PLT 327.0 04/10/2017   GLUCOSE 99 04/10/2017   CHOL 171 04/10/2017   TRIG 90.0 04/10/2017   HDL 58.60 04/10/2017   LDLDIRECT 110.1 10/19/2012   LDLCALC 94 04/10/2017   ALT 36 (H) 04/10/2017   AST 28 04/10/2017   NA 137 04/10/2017   K 4.4 04/10/2017   CL 105 04/10/2017   CREATININE 0.86 04/10/2017   BUN 15 04/10/2017   CO2 23 04/10/2017   TSH 2.61 04/10/2017    Lab Results  Component Value Date   TSH 2.61 04/10/2017   Lab Results  Component Value Date   WBC 6.9 04/10/2017   HGB 12.5  04/10/2017   HCT 37.2 04/10/2017   MCV 88.5 04/10/2017   PLT 327.0 04/10/2017   Lab Results  Component Value Date   NA 137 04/10/2017   K 4.4 04/10/2017   CO2 23 04/10/2017   GLUCOSE 99 04/10/2017   BUN 15 04/10/2017   CREATININE 0.86 04/10/2017   BILITOT 0.3 04/10/2017   ALKPHOS 53 04/10/2017   AST 28 04/10/2017   ALT 36 (H) 04/10/2017   PROT 6.9 04/10/2017   ALBUMIN 4.3 04/10/2017   CALCIUM 9.2 04/10/2017   GFR 82.42 04/10/2017   Lab Results  Component Value Date   CHOL 171 04/10/2017   Lab Results  Component Value Date   HDL 58.60 04/10/2017   Lab Results  Component Value Date   LDLCALC 94 04/10/2017   Lab Results  Component Value Date   TRIG 90.0 04/10/2017   Lab Results  Component Value Date   CHOLHDL 3 04/10/2017   No results found for: HGBA1C     Assessment & Plan:   Problem List Items Addressed This Visit    Mixed hyperlipidemia    Encouraged heart healthy diet, increase exercise, avoid trans fats, consider a krill oil cap daily      Obesity    Encouraged DASH diet, decrease po intake and increase exercise as tolerated. Needs 7-8 hours of sleep nightly. Avoid trans fats, eat small, frequent meals every 4-5 hours with lean proteins, complex carbs and healthy fats. Minimize simple carbs, consider healthy weight and wellness      Relevant Medications   lisdexamfetamine (VYVANSE) 70 MG capsule   lisdexamfetamine (VYVANSE) 70 MG capsule   lisdexamfetamine (VYVANSE) 70 MG capsule  Preventative health care    Patient encouraged to maintain heart healthy diet, regular exercise, adequate sleep. Consider daily probiotics. Take medications as prescribed      ADD (attention deficit disorder)    Refills given on Vyvanse which is working well. Paperwork is completed for [facility] to get patient assistant      Relevant Medications   lisdexamfetamine (VYVANSE) 70 MG capsule   lisdexamfetamine (VYVANSE) 70 MG capsule   lisdexamfetamine (VYVANSE) 70 MG  capsule   CTS (carpal tunnel syndrome)    Ice, Aleve, lidocaine patches and splinting. Report if worse.       Relevant Medications   lisdexamfetamine (VYVANSE) 70 MG capsule   lisdexamfetamine (VYVANSE) 70 MG capsule   lisdexamfetamine (VYVANSE) 70 MG capsule   Lesion of left upper eyelid    Likely a cholesterol deposit or small pus deposit.encouraged to debride with H2O2 and gauze then apply Benzoyl Peroxide and consider a referral to plastics if patient is interested          I have changed Kesi Bartelson's lisdexamfetamine, lisdexamfetamine, and lisdexamfetamine. I am also having her maintain her meloxicam, rizatriptan, desogestrel-ethinyl estradiol, valACYclovir, fluconazole, methocarbamol, and citalopram.  Meds ordered this encounter  Medications  . lisdexamfetamine (VYVANSE) 70 MG capsule    Sig: Take 1 capsule (70 mg total) by mouth daily. October 2020    Dispense:  30 capsule    Refill:  0  . lisdexamfetamine (VYVANSE) 70 MG capsule    Sig: Take 1 capsule (70 mg total) by mouth daily. September 2020    Dispense:  30 capsule    Refill:  0  . lisdexamfetamine (VYVANSE) 70 MG capsule    Sig: Take 1 capsule (70 mg total) by mouth daily. August 2020    Dispense:  30 capsule    Refill:  0     Penni Homans, MD

## 2019-04-15 ENCOUNTER — Other Ambulatory Visit: Payer: Self-pay | Admitting: Family Medicine

## 2019-04-27 ENCOUNTER — Encounter: Payer: Self-pay | Admitting: Family Medicine

## 2019-06-17 ENCOUNTER — Encounter: Payer: Self-pay | Admitting: Family Medicine

## 2019-06-17 ENCOUNTER — Other Ambulatory Visit: Payer: Self-pay

## 2019-06-17 ENCOUNTER — Ambulatory Visit (INDEPENDENT_AMBULATORY_CARE_PROVIDER_SITE_OTHER): Payer: BC Managed Care – PPO | Admitting: Family Medicine

## 2019-06-17 DIAGNOSIS — J029 Acute pharyngitis, unspecified: Secondary | ICD-10-CM

## 2019-06-17 DIAGNOSIS — R6889 Other general symptoms and signs: Secondary | ICD-10-CM | POA: Diagnosis not present

## 2019-06-17 DIAGNOSIS — Z20822 Contact with and (suspected) exposure to covid-19: Secondary | ICD-10-CM

## 2019-06-17 MED ORDER — FLUCONAZOLE 150 MG PO TABS: 150.0000 mg | ORAL_TABLET | Freq: Once | ORAL | 0 refills | Status: AC

## 2019-06-17 MED ORDER — CEFDINIR 300 MG PO CAPS: 300.0000 mg | ORAL_CAPSULE | Freq: Two times a day (BID) | ORAL | 0 refills | Status: DC

## 2019-06-17 NOTE — Progress Notes (Signed)
[place] at [facility] [address] [place], [place] [address] [phone]  Date:  06/17/2019   Name:  Bonnie Lewis   DOB:  10/03/1986   MRN:  [id number]  PCP:  Mosie Lukes, MD    Chief Complaint: No chief complaint on file.   History of Present Illness:  Bonnie Lewis is a 33 y.o. very pleasant female patient who presents with the following:  Pt of Dr Charlett Blake seen today for a virtual visit with concern of sore throat  Pt location is home, provider is at office I have not seen her in the past  Pt ID confirmed with 2 factors, she gives consent for virtual visit today  History of ADD, SAD, anemia  [place] notes that she has a ST and fever to about 100 since yesterday  She had to leave work yesterday due to her sx They are worried that she could have covid and want her to be tested prior to returning to work She notes a slight cough The main issue is the sore throat, fatigue, some aches  No known exposure to strep- she has been mostly isolating herself  No vomiting or diarrhea, some mild nausea  No chance of current pregnancy   She tried to look at her throat in the mirror, did not see much   Discussed antibiotics, she notes a possible sulfa allergy.  She also notes that she possibly had a mild reaction of some sort to Augmentin in the past, not anaphylaxis She has used [place] without difficulty   Patient Active Problem List   Diagnosis Date Noted  . CTS (carpal tunnel syndrome) 04/05/2019  . Lesion of left upper eyelid 04/05/2019  . Right-sided thoracic back pain 08/03/2014  . ADD (attention deficit disorder) 04/10/2014  . Migraine 05/14/2013  . Dysmenorrhea 10/20/2012  . Seasonal affective disorder ([place]) 10/19/2012  . Cervical cancer screening 10/19/2012  . Vaginitis 10/19/2012  . Preventative health care 09/16/2011  . Anemia 09/12/2011  . Insomnia 09/12/2011  . ACNE VULGARIS 09/18/2010  . FLAT FOOT 08/21/2010  .  Mixed hyperlipidemia 07/11/2010  . JAW PAIN 07/11/2010  . IBS 07/11/2010  . Obesity 06/27/2010  . Backache 06/27/2010  . SCOLIOSIS 06/27/2010    Past Medical History:  Diagnosis Date  . Acne 04/14/2015  . ADD (attention deficit disorder)   . Anemia 09/12/2011  . Bronchitis 08/11/2011  . Cervical cancer ([place]) 10/19/2012  . Cervical cancer screening 10/19/2012  . Dyslipidemia 10/20/2012  . Dysmenorrhea 10/20/2012  . Insomnia 09/12/2011  . LLQ pain 08/11/2011  . Migraine 05/14/2013  . Obesity 06/27/2010   Qualifier: Diagnosis of  By: Domingo Cocking RMA, Christy    . Preventative health care 09/16/2011  . Seasonal affective disorder ([place]) 10/19/2012  . Vaginitis 10/19/2012  . Viral pharyngitis 01/10/2016    No past surgical history on file.  Social History   Tobacco Use  . Smoking status: Never Smoker  . Smokeless tobacco: Never Used  Substance Use Topics  . Alcohol use: Yes    Comment: Sociallly   . Drug use: No    Family History  Problem Relation Age of Onset  . Asthma Father   . Depression Father        and anxiety  . Allergies Father   . Irritable bowel syndrome Father   . Dementia Father   . Depression Sister   . Irritable bowel syndrome Sister   . Migraines Sister   .  Depression Brother   . Allergies Brother   . Depression Maternal Grandmother        Anxiety  . Hypertension Maternal Grandmother   . ADD / ADHD Maternal Grandmother   . Menstrual problems Maternal Grandmother        heavy bleeding requiring hysterectomy  . Depression Paternal Grandmother   . Cardiomyopathy Paternal Grandmother   . Menstrual problems Paternal Grandmother   . Hyperlipidemia Mother   . Anemia Sister   . Coronary artery disease Paternal Grandfather   . Cancer Paternal Aunt   . Brain cancer Maternal Aunt     Allergies  Allergen Reactions  . Sulfonamide Derivatives     Medication list has been reviewed and updated.  Current Outpatient Medications on File Prior to Visit  Medication Sig  Dispense Refill  . citalopram (CELEXA) 20 MG tablet TAKE 1 AND 1/2 TABLETS DAILY BY MOUTH 135 tablet 1  . desogestrel-ethinyl estradiol (VIORELE) 0.15-0.02/0.01 MG (21/5) tablet Take 1 tablet by mouth daily. 90 tablet 3  . fluconazole (DIFLUCAN) 150 MG tablet Take 1 tablet (150 mg total) by mouth once a week. 2 tablet 1  . lisdexamfetamine (VYVANSE) 70 MG capsule Take 1 capsule (70 mg total) by mouth daily. October 2020 30 capsule 0  . lisdexamfetamine (VYVANSE) 70 MG capsule Take 1 capsule (70 mg total) by mouth daily. September 2020 30 capsule 0  . lisdexamfetamine (VYVANSE) 70 MG capsule Take 1 capsule (70 mg total) by mouth daily. August 2020 30 capsule 0  . meloxicam (MOBIC) 15 MG tablet Take 1 tablet (15 mg total) by mouth daily. 30 tablet 0  . methocarbamol (ROBAXIN) 500 MG tablet Take 1 tablet (500 mg total) by mouth 2 (two) times daily. 60 tablet 0  . rizatriptan (MAXALT) 10 MG tablet TAKE 1 TABLET (10 MG TOTAL) BY MOUTH AS NEEDED FOR MIGRAINE. MAY REPEAT IN 2 HOURS IF NEEDED 10 tablet 3  . valACYclovir (VALTREX) 1000 MG tablet Take 2 tablets (2,000 mg total) by mouth 2 (two) times daily. 120 tablet 5   No current facility-administered medications on file prior to visit.     Review of Systems:  As per HPI- otherwise negative.   Physical Examination: There were no vitals filed for this visit. There were no vitals filed for this visit. There is no height or weight on file to calculate BMI. Ideal Body Weight:    Pt observed over video -she looks well, appears overweight.  No cough, wheezing, shortness of breath is noted Not checking vitals at home   Assessment and Plan: Pharyngitis, unspecified etiology - Plan: Novel Coronavirus, NAA ([facility]), cefdinir (OMNICEF) 300 MG capsule, fluconazole (DIFLUCAN) 150 MG tablet  Virtual visit today for sore throat.  Consider COVID-19, also possible bacterial throat infection.  Ordered a coronavirus test, she will plan to get this done today.   We will have her start on Omnicef as well for possible strep pharyngitis.  Diflucan for any yeast infection that may develop secondary antibiotic use  She will keep me closely posted as to her progress  Signed Lamar Blinks, MD

## 2019-06-18 LAB — NOVEL CORONAVIRUS, NAA: SARS-CoV-2, NAA: NOT DETECTED

## 2019-06-22 ENCOUNTER — Other Ambulatory Visit: Payer: Self-pay | Admitting: Family Medicine

## 2019-07-19 ENCOUNTER — Encounter: Payer: Self-pay | Admitting: Family Medicine

## 2019-07-19 DIAGNOSIS — F988 Other specified behavioral and emotional disorders with onset usually occurring in childhood and adolescence: Secondary | ICD-10-CM

## 2019-07-19 MED ORDER — CITALOPRAM HYDROBROMIDE 20 MG PO TABS: ORAL_TABLET | ORAL | 1 refills | Status: DC

## 2019-07-19 NOTE — Telephone Encounter (Signed)
She isnt due for her 3 months until next month.Pharmacy has refill for October

## 2019-08-21 ENCOUNTER — Encounter: Payer: Self-pay | Admitting: Family Medicine

## 2019-08-23 ENCOUNTER — Other Ambulatory Visit: Payer: Self-pay | Admitting: Family Medicine

## 2019-08-23 DIAGNOSIS — F988 Other specified behavioral and emotional disorders with onset usually occurring in childhood and adolescence: Secondary | ICD-10-CM

## 2019-08-23 MED ORDER — LISDEXAMFETAMINE DIMESYLATE 70 MG PO CAPS: 70.0000 mg | ORAL_CAPSULE | Freq: Every day | ORAL | 0 refills | Status: DC

## 2019-09-21 ENCOUNTER — Encounter: Payer: Self-pay | Admitting: Family Medicine

## 2019-09-22 ENCOUNTER — Other Ambulatory Visit: Payer: Self-pay

## 2019-09-22 DIAGNOSIS — Z20822 Contact with and (suspected) exposure to covid-19: Secondary | ICD-10-CM

## 2019-09-22 NOTE — Telephone Encounter (Signed)
Called pt left msg. That order was already in the system testing hours are 10-3. If further questions are needed to call us back

## 2019-09-24 LAB — NOVEL CORONAVIRUS, NAA: SARS-CoV-2, NAA: NOT DETECTED

## 2019-10-05 ENCOUNTER — Ambulatory Visit: Payer: BC Managed Care – PPO | Admitting: Family Medicine

## 2019-11-02 ENCOUNTER — Other Ambulatory Visit: Payer: Self-pay

## 2019-11-04 ENCOUNTER — Other Ambulatory Visit: Payer: Self-pay

## 2019-11-04 ENCOUNTER — Encounter: Payer: Self-pay | Admitting: Family Medicine

## 2019-11-04 ENCOUNTER — Ambulatory Visit (INDEPENDENT_AMBULATORY_CARE_PROVIDER_SITE_OTHER): Payer: BC Managed Care – PPO | Admitting: Family Medicine

## 2019-11-04 VITALS — BP 110/80 | HR 84 | Temp 98.1°F | Resp 18 | Wt 220.0 lb

## 2019-11-04 DIAGNOSIS — E782 Mixed hyperlipidemia: Secondary | ICD-10-CM | POA: Diagnosis not present

## 2019-11-04 DIAGNOSIS — Z79899 Other long term (current) drug therapy: Secondary | ICD-10-CM | POA: Diagnosis not present

## 2019-11-04 DIAGNOSIS — M546 Pain in thoracic spine: Secondary | ICD-10-CM

## 2019-11-04 DIAGNOSIS — Z23 Encounter for immunization: Secondary | ICD-10-CM

## 2019-11-04 DIAGNOSIS — F988 Other specified behavioral and emotional disorders with onset usually occurring in childhood and adolescence: Secondary | ICD-10-CM | POA: Diagnosis not present

## 2019-11-04 DIAGNOSIS — M79641 Pain in right hand: Secondary | ICD-10-CM

## 2019-11-04 DIAGNOSIS — N946 Dysmenorrhea, unspecified: Secondary | ICD-10-CM | POA: Diagnosis not present

## 2019-11-04 MED ORDER — LISDEXAMFETAMINE DIMESYLATE 70 MG PO CAPS: 70.0000 mg | ORAL_CAPSULE | Freq: Every day | ORAL | 0 refills | Status: DC

## 2019-11-04 MED ORDER — FLUCONAZOLE 150 MG PO TABS: 150.0000 mg | ORAL_TABLET | ORAL | 1 refills | Status: DC

## 2019-11-04 MED ORDER — VALACYCLOVIR HCL 1 G PO TABS: 2000.0000 mg | ORAL_TABLET | Freq: Two times a day (BID) | ORAL | 5 refills | Status: DC

## 2019-11-04 MED ORDER — TIZANIDINE HCL 2 MG PO TABS: 1.0000 mg | ORAL_TABLET | Freq: Two times a day (BID) | ORAL | 0 refills | Status: DC | PRN

## 2019-11-04 NOTE — Assessment & Plan Note (Signed)
Encouraged heart healthy diet, increase exercise, avoid trans fats, consider a krill oil cap daily 

## 2019-11-04 NOTE — Patient Instructions (Signed)
Omron blood pressure cuff, upper arm Pulse oximeter, want oxygen in the 90s  Multivitamin with minerals, selenium Vitamin D 2000 IU daily Aspirin EC 81 mg daily Probiotic daily  Melatonin 2.5 to 5 mg at bedtime  DASH Eating Plan DASH stands for "Dietary Approaches to Stop Hypertension." The DASH eating plan is a healthy eating plan that has been shown to reduce high blood pressure (hypertension). It may also reduce your risk for type 2 diabetes, heart disease, and stroke. The DASH eating plan may also help with weight loss. What are tips for following this plan?  General guidelines  Avoid eating more than 2,300 mg (milligrams) of salt (sodium) a day. If you have hypertension, you may need to reduce your sodium intake to 1,500 mg a day.  Limit alcohol intake to no more than 1 drink a day for nonpregnant women and 2 drinks a day for men. One drink equals 12 oz of beer, 5 oz of wine, or 1 oz of hard liquor.  Work with your health care provider to maintain a healthy body weight or to lose weight. Ask what an ideal weight is for you.  Get at least 30 minutes of exercise that causes your heart to beat faster (aerobic exercise) most days of the week. Activities may include walking, swimming, or biking.  Work with your health care provider or diet and nutrition specialist (dietitian) to adjust your eating plan to your individual calorie needs. Reading food labels   Check food labels for the amount of sodium per serving. Choose foods with less than 5 percent of the Daily Value of sodium. Generally, foods with less than 300 mg of sodium per serving fit into this eating plan.  To find whole grains, look for the word "whole" as the first word in the ingredient list. Shopping  Buy products labeled as "low-sodium" or "no salt added."  Buy fresh foods. Avoid canned foods and premade or frozen meals. Cooking  Avoid adding salt when cooking. Use salt-free seasonings or herbs instead of table  salt or sea salt. Check with your health care provider or pharmacist before using salt substitutes.  Do not fry foods. Cook foods using healthy methods such as baking, boiling, grilling, and broiling instead.  Cook with heart-healthy oils, such as olive, canola, soybean, or sunflower oil. Meal planning  Eat a balanced diet that includes: ? 5 or more servings of fruits and vegetables each day. At each meal, try to fill half of your plate with fruits and vegetables. ? Up to 6-8 servings of whole grains each day. ? Less than 6 oz of lean meat, poultry, or fish each day. A 3-oz serving of meat is about the same size as a deck of cards. One egg equals 1 oz. ? 2 servings of low-fat dairy each day. ? A serving of nuts, seeds, or beans 5 times each week. ? Heart-healthy fats. Healthy fats called Omega-3 fatty acids are found in foods such as flaxseeds and coldwater fish, like sardines, salmon, and mackerel.  Limit how much you eat of the following: ? Canned or prepackaged foods. ? Food that is high in trans fat, such as fried foods. ? Food that is high in saturated fat, such as fatty meat. ? Sweets, desserts, sugary drinks, and other foods with added sugar. ? Full-fat dairy products.  Do not salt foods before eating.  Try to eat at least 2 vegetarian meals each week.  Eat more home-cooked food and less restaurant, buffet, and fast  food.  When eating at a restaurant, ask that your food be prepared with less salt or no salt, if possible. What foods are recommended? The items listed may not be a complete list. Talk with your dietitian about what dietary choices are best for you. Grains Whole-grain or whole-wheat bread. Whole-grain or whole-wheat pasta. Brown rice. [place]. Bulgur. Whole-grain and low-sodium cereals. Pita bread. Low-fat, low-sodium crackers. Whole-wheat flour tortillas. Vegetables Fresh or frozen vegetables (raw, steamed, roasted, or grilled). Low-sodium or  reduced-sodium tomato and vegetable juice. Low-sodium or reduced-sodium tomato sauce and tomato paste. Low-sodium or reduced-sodium canned vegetables. Fruits All fresh, dried, or frozen fruit. Canned fruit in natural juice (without added sugar). Meat and other protein foods Skinless chicken or [place]. Ground chicken or [place]. Pork with fat trimmed off. Fish and seafood. Egg whites. Dried beans, peas, or lentils. Unsalted nuts, nut butters, and seeds. Unsalted canned beans. Lean cuts of beef with fat trimmed off. Low-sodium, lean deli meat. Dairy Low-fat (1%) or fat-free (skim) milk. Fat-free, low-fat, or reduced-fat cheeses. Nonfat, low-sodium ricotta or cottage cheese. Low-fat or nonfat yogurt. Low-fat, low-sodium cheese. Fats and oils Soft margarine without trans fats. Vegetable oil. Low-fat, reduced-fat, or light mayonnaise and salad dressings (reduced-sodium). Canola, safflower, olive, soybean, and sunflower oils. Avocado. Seasoning and other foods Herbs. Spices. Seasoning mixes without salt. Unsalted popcorn and pretzels. Fat-free sweets. What foods are not recommended? The items listed may not be a complete list. Talk with your dietitian about what dietary choices are best for you. Grains Baked goods made with fat, such as croissants, muffins, or some breads. Dry pasta or rice meal packs. Vegetables Creamed or fried vegetables. Vegetables in a cheese sauce. Regular canned vegetables (not low-sodium or reduced-sodium). Regular canned tomato sauce and paste (not low-sodium or reduced-sodium). Regular tomato and vegetable juice (not low-sodium or reduced-sodium). Angie Fava. Olives. Fruits Canned fruit in a light or heavy syrup. Fried fruit. Fruit in cream or butter sauce. Meat and other protein foods Fatty cuts of meat. Ribs. Fried meat. Berniece Salines. Sausage. Bologna and other processed lunch meats. Salami. Fatback. Hotdogs. Bratwurst. Salted nuts and seeds. Canned beans with added salt. Canned or  smoked fish. Whole eggs or egg yolks. Chicken or [place] with skin. Dairy Whole or 2% milk, cream, and half-and-half. Whole or full-fat cream cheese. Whole-fat or sweetened yogurt. Full-fat cheese. Nondairy creamers. Whipped toppings. Processed cheese and cheese spreads. Fats and oils Butter. Stick margarine. Lard. Shortening. Ghee. Bacon fat. Tropical oils, such as coconut, palm kernel, or palm oil. Seasoning and other foods Salted popcorn and pretzels. Onion salt, garlic salt, seasoned salt, table salt, and sea salt. Worcestershire sauce. Tartar sauce. Barbecue sauce. Teriyaki sauce. Soy sauce, including reduced-sodium. Steak sauce. Canned and packaged gravies. Fish sauce. Oyster sauce. Cocktail sauce. Horseradish that you find on the shelf. Ketchup. Mustard. Meat flavorings and tenderizers. Bouillon cubes. Hot sauce and Tabasco sauce. Premade or packaged marinades. Premade or packaged taco seasonings. Relishes. Regular salad dressings. Where to find more information:  National Heart, Lung, and [place]: https://wilson-eaton.com/  American Heart Association: www.heart.org Summary  The DASH eating plan is a healthy eating plan that has been shown to reduce high blood pressure (hypertension). It may also reduce your risk for type 2 diabetes, heart disease, and stroke.  With the DASH eating plan, you should limit salt (sodium) intake to 2,300 mg a day. If you have hypertension, you may need to reduce your sodium intake to 1,500 mg a day.  When on the DASH  eating plan, aim to eat more fresh fruits and vegetables, whole grains, lean proteins, low-fat dairy, and heart-healthy fats.  Work with your health care provider or diet and nutrition specialist (dietitian) to adjust your eating plan to your individual calorie needs. This information is not intended to replace advice given to you by your health care provider. Make sure you discuss any questions you have with your health care provider. Document  Revised: 09/12/2017 Document Reviewed: 09/23/2016 Elsevier Patient Education  2020 [employer].

## 2019-11-07 DIAGNOSIS — M79641 Pain in right hand: Secondary | ICD-10-CM | POA: Insufficient documentation

## 2019-11-07 NOTE — Assessment & Plan Note (Signed)
With some recent increased irregularity she is currently attributing to stress mostly at work. If it persists she will consider referral to OB/GYN for further consideration.

## 2019-11-07 NOTE — Assessment & Plan Note (Signed)
Refills allowed on meds no changes

## 2019-11-07 NOTE — Assessment & Plan Note (Signed)
Encouraged moist heat and gentle stretching as tolerated. May try NSAIDs and prescription meds as directed and report if symptoms worsen or seek immediate care. Tizanidine prn 

## 2019-11-07 NOTE — Progress Notes (Signed)
Subjective:    Patient ID: Bonnie Lewis, female    DOB: 1986/06/09, 34 y.o.   MRN: [id number]  No chief complaint on file.   HPI Patient is in today for follow up. She is under a great deal of stress as her boss at work has just gone to jail for complicated reasons. No recent febrile illness or hospitalizations. She is noting her cycles have become more irregular despite OCPs which she attributes to stress. She also notes increased low back pain and some right hand pain and some retractions in her 2nd finger In her right hand pain. Denies CP/palp/SOB/HA/congestion/fevers/GI or GU c/o. Taking meds as prescribed  Past Medical History:  Diagnosis Date  . Acne 04/14/2015  . ADD (attention deficit disorder)   . Anemia 09/12/2011  . Bronchitis 08/11/2011  . Cervical cancer ([place]) 10/19/2012  . Cervical cancer screening 10/19/2012  . Dyslipidemia 10/20/2012  . Dysmenorrhea 10/20/2012  . Insomnia 09/12/2011  . LLQ pain 08/11/2011  . Migraine 05/14/2013  . Obesity 06/27/2010   Qualifier: Diagnosis of  By: Domingo Cocking RMA, Christy    . Preventative health care 09/16/2011  . Seasonal affective disorder ([place]) 10/19/2012  . Vaginitis 10/19/2012  . Viral pharyngitis 01/10/2016    No past surgical history on file.  Family History  Problem Relation Age of Onset  . Asthma Father   . Depression Father        and anxiety  . Allergies Father   . Irritable bowel syndrome Father   . Dementia Father   . Depression Sister   . Irritable bowel syndrome Sister   . Migraines Sister   . Depression Brother   . Allergies Brother   . Depression Maternal Grandmother        Anxiety  . Hypertension Maternal Grandmother   . ADD / ADHD Maternal Grandmother   . Menstrual problems Maternal Grandmother        heavy bleeding requiring hysterectomy  . Depression Paternal Grandmother   . Cardiomyopathy Paternal Grandmother   . Menstrual problems Paternal Grandmother   . Hyperlipidemia Mother   . Anemia Sister   . Coronary  artery disease Paternal Grandfather   . Cancer Paternal Aunt   . Brain cancer Maternal Aunt     Social History   Socioeconomic History  . Marital status: Single    Spouse name: Not on file  . Number of children: Not on file  . Years of education: Not on file  . Highest education level: Not on file  Occupational History  . Not on file  Tobacco Use  . Smoking status: Never Smoker  . Smokeless tobacco: Never Used  Substance and Sexual Activity  . Alcohol use: Yes    Comment: Sociallly   . Drug use: No  . Sexual activity: Yes    Comment: lives at home to help her mother with her father who has early onset Alzheimer's  Other Topics Concern  . Not on file  Social History Narrative  . Not on file   Social Determinants of Health   Financial Resource Strain:   . Difficulty of Paying Living Expenses: Not on file  Food Insecurity:   . Worried About Charity fundraiser in the Last Year: Not on file  . Ran Out of Food in the Last Year: Not on file  Transportation Needs:   . Lack of Transportation (Medical): Not on file  . Lack of Transportation (Non-Medical): Not on file  Physical Activity:   . Days  of Exercise per Week: Not on file  . Minutes of Exercise per Session: Not on file  Stress:   . Feeling of Stress : Not on file  Social Connections:   . Frequency of Communication with Friends and Family: Not on file  . Frequency of Social Gatherings with Friends and Family: Not on file  . Attends Religious Services: Not on file  . Active Member of Clubs or Organizations: Not on file  . Attends Archivist Meetings: Not on file  . Marital Status: Not on file  Intimate Partner Violence:   . Fear of Current or Ex-Partner: Not on file  . Emotionally Abused: Not on file  . Physically Abused: Not on file  . Sexually Abused: Not on file    Outpatient Medications Prior to Visit  Medication Sig Dispense Refill  . citalopram (CELEXA) 20 MG tablet TAKE 1 AND 1/2 TABLETS DAILY  BY MOUTH 135 tablet 1  . KARIVA 0.15-0.02/0.01 MG (21/5) tablet TAKE 1 TABLET BY MOUTH EVERY DAY 84 tablet 3  . rizatriptan (MAXALT) 10 MG tablet TAKE 1 TABLET (10 MG TOTAL) BY MOUTH AS NEEDED FOR MIGRAINE. MAY REPEAT IN 2 HOURS IF NEEDED 10 tablet 3  . cefdinir (OMNICEF) 300 MG capsule Take 1 capsule (300 mg total) by mouth 2 (two) times daily. 20 capsule 0  . fluconazole (DIFLUCAN) 150 MG tablet Take 1 tablet (150 mg total) by mouth once a week. 2 tablet 1  . lisdexamfetamine (VYVANSE) 70 MG capsule Take 1 capsule (70 mg total) by mouth daily. January 2021 30 capsule 0  . lisdexamfetamine (VYVANSE) 70 MG capsule Take 1 capsule (70 mg total) by mouth daily. December 2020 30 capsule 0  . lisdexamfetamine (VYVANSE) 70 MG capsule Take 1 capsule (70 mg total) by mouth daily. November 2020 30 capsule 0  . meloxicam (MOBIC) 15 MG tablet Take 1 tablet (15 mg total) by mouth daily. 30 tablet 0  . methocarbamol (ROBAXIN) 500 MG tablet Take 1 tablet (500 mg total) by mouth 2 (two) times daily. 60 tablet 0  . valACYclovir (VALTREX) 1000 MG tablet Take 2 tablets (2,000 mg total) by mouth 2 (two) times daily. 120 tablet 5   No facility-administered medications prior to visit.    Allergies  Allergen Reactions  . Sulfonamide Derivatives     Review of Systems  Constitutional: Positive for malaise/fatigue. Negative for fever.  HENT: Negative for congestion.   Eyes: Negative for blurred vision.  Respiratory: Negative for shortness of breath.   Cardiovascular: Negative for chest pain, palpitations and leg swelling.  Gastrointestinal: Negative for abdominal pain, blood in stool and nausea.  Genitourinary: Negative for dysuria and frequency.  Musculoskeletal: Positive for back pain and joint pain. Negative for falls.  Skin: Negative for rash.  Neurological: Negative for dizziness, loss of consciousness and headaches.  Endo/Heme/Allergies: Negative for environmental allergies.  Psychiatric/Behavioral:  Negative for depression. The patient is nervous/anxious.        Objective:    Physical Exam Vitals and nursing note reviewed.  Constitutional:      General: She is not in acute distress.    Appearance: She is well-developed.  HENT:     Head: Normocephalic and atraumatic.     Nose: Nose normal.  Eyes:     General:        Right eye: No discharge.        Left eye: No discharge.  Cardiovascular:     Rate and Rhythm: Normal rate and regular  rhythm.     Heart sounds: No murmur.  Pulmonary:     Effort: Pulmonary effort is normal.     Breath sounds: Normal breath sounds.  Abdominal:     General: Bowel sounds are normal.     Palpations: Abdomen is soft.     Tenderness: There is no abdominal tenderness.  Musculoskeletal:     Cervical back: Normal range of motion and neck supple.  Skin:    General: Skin is warm and dry.  Neurological:     Mental Status: She is alert and oriented to person, place, and time.     BP 110/80 (BP Location: Left Arm, Patient Position: Sitting, Cuff Size: Normal)   Pulse 84   Temp 98.1 F (36.7 C) (Temporal)   Resp 18   Wt 220 lb (99.8 kg)   SpO2 98%   BMI 37.76 kg/m  Wt Readings from Last 3 Encounters:  11/04/19 220 lb (99.8 kg)  04/05/19 223 lb 3.2 oz (101.2 kg)  10/19/18 219 lb 12.8 oz (99.7 kg)    Diabetic Foot Exam - Simple   No data filed     Lab Results  Component Value Date   WBC 6.9 04/10/2017   HGB 12.5 04/10/2017   HCT 37.2 04/10/2017   PLT 327.0 04/10/2017   GLUCOSE 99 04/10/2017   CHOL 171 04/10/2017   TRIG 90.0 04/10/2017   HDL 58.60 04/10/2017   LDLDIRECT 110.1 10/19/2012   LDLCALC 94 04/10/2017   ALT 36 (H) 04/10/2017   AST 28 04/10/2017   NA 137 04/10/2017   K 4.4 04/10/2017   CL 105 04/10/2017   CREATININE 0.86 04/10/2017   BUN 15 04/10/2017   CO2 23 04/10/2017   TSH 2.61 04/10/2017    Lab Results  Component Value Date   TSH 2.61 04/10/2017   Lab Results  Component Value Date   WBC 6.9 04/10/2017    HGB 12.5 04/10/2017   HCT 37.2 04/10/2017   MCV 88.5 04/10/2017   PLT 327.0 04/10/2017   Lab Results  Component Value Date   NA 137 04/10/2017   K 4.4 04/10/2017   CO2 23 04/10/2017   GLUCOSE 99 04/10/2017   BUN 15 04/10/2017   CREATININE 0.86 04/10/2017   BILITOT 0.3 04/10/2017   ALKPHOS 53 04/10/2017   AST 28 04/10/2017   ALT 36 (H) 04/10/2017   PROT 6.9 04/10/2017   ALBUMIN 4.3 04/10/2017   CALCIUM 9.2 04/10/2017   GFR 82.42 04/10/2017   Lab Results  Component Value Date   CHOL 171 04/10/2017   Lab Results  Component Value Date   HDL 58.60 04/10/2017   Lab Results  Component Value Date   LDLCALC 94 04/10/2017   Lab Results  Component Value Date   TRIG 90.0 04/10/2017   Lab Results  Component Value Date   CHOLHDL 3 04/10/2017   No results found for: HGBA1C     Assessment & Plan:   Problem List Items Addressed This Visit    Backache    Encouraged moist heat and gentle stretching as tolerated. May try NSAIDs and prescription meds as directed and report if symptoms worsen or seek immediate care. Tizanidine prn      Relevant Medications   tiZANidine (ZANAFLEX) 2 MG tablet   Mixed hyperlipidemia    Encouraged heart healthy diet, increase exercise, avoid trans fats, consider a krill oil cap daily      Dysmenorrhea    With some recent increased irregularity she is currently attributing  to stress mostly at work. If it persists she will consider referral to OB/GYN for further consideration.      ADD (attention deficit disorder)    Refills allowed on meds no changes      Relevant Medications   lisdexamfetamine (VYVANSE) 70 MG capsule   lisdexamfetamine (VYVANSE) 70 MG capsule   lisdexamfetamine (VYVANSE) 70 MG capsule   Right hand pain    Wrist into 2nd finger. Encouraged RICE and splinting and topical NSAIDs notify us if not improved for referral       Other Visit Diagnoses    High risk medication use    -  Primary   Relevant Orders   Pain Mgmt,  Profile 8 w/Conf, U   Needs flu shot       Relevant Orders   Flu Vaccine QUAD 6+ mos PF IM (Fluarix Quad PF) (Completed)      I have discontinued Mellody Widdowson's meloxicam, methocarbamol, and cefdinir. I have also changed her lisdexamfetamine, lisdexamfetamine, and lisdexamfetamine. Additionally, I am having her start on tiZANidine. Lastly, I am having her maintain her rizatriptan, Kariva, citalopram, valACYclovir, and fluconazole.  Meds ordered this encounter  Medications  . lisdexamfetamine (VYVANSE) 70 MG capsule    Sig: Take 1 capsule (70 mg total) by mouth daily. April 2021    Dispense:  30 capsule    Refill:  0  . lisdexamfetamine (VYVANSE) 70 MG capsule    Sig: Take 1 capsule (70 mg total) by mouth daily. March 2021    Dispense:  30 capsule    Refill:  0  . lisdexamfetamine (VYVANSE) 70 MG capsule    Sig: Take 1 capsule (70 mg total) by mouth daily. February 2021    Dispense:  30 capsule    Refill:  0  . valACYclovir (VALTREX) 1000 MG tablet    Sig: Take 2 tablets (2,000 mg total) by mouth 2 (two) times daily.    Dispense:  120 tablet    Refill:  5  . fluconazole (DIFLUCAN) 150 MG tablet    Sig: Take 1 tablet (150 mg total) by mouth once a week.    Dispense:  4 tablet    Refill:  1  . tiZANidine (ZANAFLEX) 2 MG tablet    Sig: Take 0.5-2 tablets (1-4 mg total) by mouth 2 (two) times daily as needed for muscle spasms.    Dispense:  30 tablet    Refill:  0     Penni Homans, MD

## 2019-11-07 NOTE — Assessment & Plan Note (Signed)
Wrist into 2nd finger. Encouraged RICE and splinting and topical NSAIDs notify us if not improved for referral

## 2019-11-08 ENCOUNTER — Other Ambulatory Visit: Payer: Self-pay | Admitting: Family Medicine

## 2019-11-13 LAB — PAIN MGMT, PROFILE 8 W/CONF, U
6 Acetylmorphine: NEGATIVE ng/mL
Alcohol Metabolites: POSITIVE ng/mL — AB (ref <500)
Amphetamine: 1890 ng/mL
Amphetamines: POSITIVE ng/mL
Benzodiazepines: NEGATIVE ng/mL
Buprenorphine, Urine: NEGATIVE ng/mL
Cocaine Metabolite: NEGATIVE ng/mL
Creatinine: 61.8 mg/dL
Ethyl Glucuronide (ETG): 1189 ng/mL
Ethyl Sulfate (ETS): 146 ng/mL
MDMA: NEGATIVE ng/mL
Marijuana Metabolite: 33 ng/mL
Marijuana Metabolite: POSITIVE ng/mL
Methamphetamine: NEGATIVE ng/mL
Opiates: NEGATIVE ng/mL
Oxidant: NEGATIVE ug/mL
Oxycodone: NEGATIVE ng/mL
pH: 7.4 (ref 4.5–9.0)

## 2020-01-03 ENCOUNTER — Other Ambulatory Visit: Payer: Self-pay | Admitting: Family Medicine

## 2020-01-13 ENCOUNTER — Other Ambulatory Visit: Payer: Self-pay | Admitting: Family Medicine

## 2020-01-16 ENCOUNTER — Other Ambulatory Visit: Payer: Self-pay | Admitting: Family Medicine

## 2020-01-16 DIAGNOSIS — F988 Other specified behavioral and emotional disorders with onset usually occurring in childhood and adolescence: Secondary | ICD-10-CM

## 2020-02-06 ENCOUNTER — Encounter: Payer: Self-pay | Admitting: Family Medicine

## 2020-02-07 ENCOUNTER — Other Ambulatory Visit: Payer: Self-pay | Admitting: Family Medicine

## 2020-02-07 DIAGNOSIS — F988 Other specified behavioral and emotional disorders with onset usually occurring in childhood and adolescence: Secondary | ICD-10-CM

## 2020-02-07 MED ORDER — LISDEXAMFETAMINE DIMESYLATE 70 MG PO CAPS: 70.0000 mg | ORAL_CAPSULE | Freq: Every day | ORAL | 0 refills | Status: DC

## 2020-02-14 ENCOUNTER — Telehealth: Payer: Self-pay | Admitting: *Deleted

## 2020-02-14 NOTE — Telephone Encounter (Signed)
Left message on machine to call back  Received paperwork on patient assistance for vyvanse.  Need to see if she filled out her portion about insurance and income, patient declaration and patient authorization section?

## 2020-02-15 ENCOUNTER — Encounter: Payer: Self-pay | Admitting: *Deleted

## 2020-02-15 NOTE — Telephone Encounter (Signed)
Left message on machine to call back.  Also sent a mychart message to patient.

## 2020-02-18 ENCOUNTER — Encounter: Payer: Self-pay | Admitting: Family Medicine

## 2020-02-21 NOTE — Telephone Encounter (Signed)
Spoke with pt. She states symptoms are the same. She would like to wait and see PCP. Scheduled appt for tomorrow at 10am.

## 2020-02-22 ENCOUNTER — Other Ambulatory Visit: Payer: Self-pay

## 2020-02-22 ENCOUNTER — Ambulatory Visit: Payer: BC Managed Care – PPO | Admitting: Family Medicine

## 2020-02-22 DIAGNOSIS — F988 Other specified behavioral and emotional disorders with onset usually occurring in childhood and adolescence: Secondary | ICD-10-CM | POA: Diagnosis not present

## 2020-02-22 DIAGNOSIS — K611 Rectal abscess: Secondary | ICD-10-CM

## 2020-02-22 MED ORDER — MUPIROCIN 2 % EX OINT: 1.0000 "application " | TOPICAL_OINTMENT | Freq: Two times a day (BID) | CUTANEOUS | 0 refills | Status: DC | PRN

## 2020-02-22 MED ORDER — CEFTRIAXONE SODIUM 1 G IJ SOLR
1.0000 g | Freq: Once | INTRAMUSCULAR | Status: AC
  Administered 2020-02-22: 1 g via INTRAMUSCULAR

## 2020-02-22 MED ORDER — FLUCONAZOLE 150 MG PO TABS
150.0000 mg | ORAL_TABLET | ORAL | 1 refills | Status: DC
Start: 2020-02-22 — End: 2020-10-16

## 2020-02-22 MED ORDER — CEPHALEXIN 500 MG PO CAPS: 500.0000 mg | ORAL_CAPSULE | Freq: Four times a day (QID) | ORAL | 0 refills | Status: DC

## 2020-02-22 NOTE — Patient Instructions (Addendum)
Sitz baths twice daily for 15 minutes, apply 1 tbls of bleach to water. After bath scrub area with gauze and hydrogen peroxide May apply Mupirocin if skin opens  For pain take tylenol ES 500 mg three times a day and Ibuprofen 400 mg three x a day    Anorectal Abscess An abscess is an infected area that contains a collection of pus. An anorectal abscess is an abscess that is near the opening of the anus or around the rectum. Without treatment, an anorectal abscess can become larger and cause other problems, such as a more serious body-wide infection or pain, especially during bowel movements. What are the causes? This condition is caused by plugged glands or an infection in one of these areas:  The anus.  The area between the anus and the scrotum in males or between the anus and the vagina in females (perineum). What increases the risk? The following factors may make you more likely to develop this condition:  Diabetes or inflammatory bowel disease.  Having a body defense system (immune system) that is weak.  Engaging in anal sex.  Having a sexually transmitted infection (STI).  Certain kinds of cancer, such as rectal carcinoma, leukemia, or lymphoma. What are the signs or symptoms? The main symptom of this condition is pain. The pain may be a throbbing pain that gets worse during bowel movements. Other symptoms include:  Swelling and redness in the area of the abscess. The redness may go beyond the abscess and appear as a red streak on the skin.  A visible, painful lump, or a lump that can be felt when touched.  Bleeding or pus-like discharge from the area.  Fever.  General weakness.  Constipation.  Diarrhea. How is this diagnosed? This condition is diagnosed based on your medical history and a physical exam of the affected area.  This may involve examining the rectal area with a gloved hand (digital rectal exam).  Sometimes, the health care provider needs to look into  the rectum using a probe, scope, or imaging test.  For women, it may require a careful vaginal exam. How is this treated? Treatment for this condition may include:  Incision and drainage surgery. This involves making an incision over the abscess to drain the pus.  Medicines, including antibiotic medicine, pain medicine, stool softeners, or laxatives. Follow these instructions at home: Medicines  Take over-the-counter and prescription medicines only as told by your health care provider.  If you were prescribed an antibiotic medicine, use it as told by your health care provider. Do not stop using the antibiotic even if you start to feel better.  Do not drive or use heavy machinery while taking prescription pain medicine. Wound care   If gauze was used in the abscess, follow instructions from your health care provider about removing or changing the gauze. It can usually be removed in 2-3 days.  Wash your hands with soap and water before you remove or change your gauze. If soap and water are not available, use hand sanitizer.  If one or more drains were placed in the abscess cavity, be careful not to pull at them. Your health care provider will tell you how long they need to remain in place.  Check your incision area every day for signs of infection. Check for: ? More redness, swelling, or pain. ? More fluid or blood. ? Warmth. ? Pus or a bad smell. Managing pain, stiffness, and swelling   Take a sitz bath 3-4 times a  day and after bowel movements. This will help reduce pain and swelling.  To relieve pain, try sitting: ? On a heating pad with the setting on low. ? On an inflatable donut-shaped cushion.  If directed, put ice on the affected area: ? Put ice in a plastic bag. ? Place a towel between your skin and the bag. ? Leave the ice on for 20 minutes, 2-3 times a day. General instructions  Follow any diet instructions given by your health care provider.  Keep all  follow-up visits as told by your health care provider. This is important. Contact a health care provider if you have:  Bleeding from your incision.  Pain, swelling, or redness that does not improve or gets worse.  Trouble passing stool or urine.  Symptoms that return after treatment. Get help right away if you:  Have problems moving or using your legs.  Have severe or increasing pain.  Have swelling in the affected area that suddenly gets worse.  Have a large increase in bleeding or passing of pus.  Develop chills or a fever. Summary  An anorectal abscess is an abscess that is near the opening of the anus or around the rectum. An abscess is an infected area that contains a collection of pus.  The main symptom of this condition is pain. It may be a throbbing pain that gets worse during bowel movements.  Treatment for an anorectal abscess may include surgery to drain the pus from the abscess. Medicines and sitz baths may also be a part of your treatment plan. This information is not intended to replace advice given to you by your health care provider. Make sure you discuss any questions you have with your health care provider. Document Revised: 11/06/2017 Document Reviewed: 11/06/2017 Elsevier Patient Education  2020 [employer].

## 2020-02-22 NOTE — Assessment & Plan Note (Signed)
Paperwork completed for Vyvanse patient assistance

## 2020-02-22 NOTE — Assessment & Plan Note (Addendum)
Very painful and enlarging over several weeks then began to escalate about 4 days ago. Malaise, fatigue. Started on antibiotics, given Ceftriaxone and started on keflex, referred to general surgery. Take Sitz baths 15 minutes bid with bleach 1 tbls in water. Scrub with h2o2.  For pain take tylenol ES 500 mg three times a day and Ibuprofen 400 mg three x a day

## 2020-02-22 NOTE — Progress Notes (Signed)
Subjective:    Patient ID: Bonnie Lewis, female    DOB: [birth year], 34 y.o.   MRN: [id number]  Chief Complaint  Patient presents with  . abcess near anus    HPI Patient is in today for evaluation of rectal abscess. Vey painful and enlarging over several weeks then began to escalate about 4 days ago. Malaise, fatigue. Possibly some low grade fevers and chills. Denies CP/palp/SOB/HA/congestion/fevers/GI or GU c/o. Taking meds as prescribed  Past Medical History:  Diagnosis Date  . Acne 04/14/2015  . ADD (attention deficit disorder)   . Anemia 09/12/2011  . Bronchitis 08/11/2011  . Cervical cancer ([place]) 10/19/2012  . Cervical cancer screening 10/19/2012  . Dyslipidemia 10/20/2012  . Dysmenorrhea 10/20/2012  . Insomnia 09/12/2011  . LLQ pain 08/11/2011  . Migraine 05/14/2013  . Obesity 06/27/2010   Qualifier: Diagnosis of  By: Domingo Cocking RMA, Christy    . Preventative health care 09/16/2011  . Seasonal affective disorder ([place]) 10/19/2012  . Vaginitis 10/19/2012  . Viral pharyngitis 01/10/2016    No past surgical history on file.  Family History  Problem Relation Age of Onset  . Asthma Father   . Depression Father        and anxiety  . Allergies Father   . Irritable bowel syndrome Father   . Dementia Father   . Depression Sister   . Irritable bowel syndrome Sister   . Migraines Sister   . Depression Brother   . Allergies Brother   . Depression Maternal Grandmother        Anxiety  . Hypertension Maternal Grandmother   . ADD / ADHD Maternal Grandmother   . Menstrual problems Maternal Grandmother        heavy bleeding requiring hysterectomy  . Depression Paternal Grandmother   . Cardiomyopathy Paternal Grandmother   . Menstrual problems Paternal Grandmother   . Hyperlipidemia Mother   . Anemia Sister   . Coronary artery disease Paternal Grandfather   . Cancer Paternal Aunt   . Brain cancer Maternal Aunt     Social History   Socioeconomic History  . Marital status: Single   Spouse name: Not on file  . Number of children: Not on file  . Years of education: Not on file  . Highest education level: Not on file  Occupational History  . Not on file  Tobacco Use  . Smoking status: Never Smoker  . Smokeless tobacco: Never Used  Substance and Sexual Activity  . Alcohol use: Yes    Comment: Sociallly   . Drug use: No  . Sexual activity: Yes    Comment: lives at home to help her mother with her father who has early onset Alzheimer's  Other Topics Concern  . Not on file  Social History Narrative  . Not on file   Social Determinants of Health   Financial Resource Strain:   . Difficulty of Paying Living Expenses:   Food Insecurity:   . Worried About Charity fundraiser in the Last Year:   . Arboriculturist in the Last Year:   Transportation Needs:   . Film/video editor (Medical):   Marland Kitchen Lack of Transportation (Non-Medical):   Physical Activity:   . Days of Exercise per Week:   . Minutes of Exercise per Session:   Stress:   . Feeling of Stress :   Social Connections:   . Frequency of Communication with Friends and Family:   . Frequency of Social Gatherings with Friends and  Family:   . Attends Religious Services:   . Active Member of Clubs or Organizations:   . Attends Archivist Meetings:   Marland Kitchen Marital Status:   Intimate Partner Violence:   . Fear of Current or Ex-Partner:   . Emotionally Abused:   Marland Kitchen Physically Abused:   . Sexually Abused:     Outpatient Medications Prior to Visit  Medication Sig Dispense Refill  . citalopram (CELEXA) 20 MG tablet TAKE 1 AND 1/2 TABLETS BY MOUTH DAILY 135 tablet 1  . KARIVA 0.15-0.02/0.01 MG (21/5) tablet TAKE 1 TABLET BY MOUTH EVERY DAY 84 tablet 3  . lisdexamfetamine (VYVANSE) 70 MG capsule Take 1 capsule (70 mg total) by mouth daily. July 2021 30 capsule 0  . lisdexamfetamine (VYVANSE) 70 MG capsule Take 1 capsule (70 mg total) by mouth daily. June 2021 30 capsule 0  . lisdexamfetamine (VYVANSE) 70  MG capsule Take 1 capsule (70 mg total) by mouth daily. May 2021 30 capsule 0  . rizatriptan (MAXALT) 10 MG tablet TAKE 1 TABLET (10 MG TOTAL) BY MOUTH AS NEEDED FOR MIGRAINE. MAY REPEAT IN 2 HOURS IF NEEDED 10 tablet 3  . tiZANidine (ZANAFLEX) 2 MG tablet TAKE 1/2 TO 2 TABLET BY MOUTH TWICE A DAY AS NEEDED FOR MUSCLE SPASM 90 tablet 1  . valACYclovir (VALTREX) 1000 MG tablet Take 2 tablets (2,000 mg total) by mouth 2 (two) times daily. 120 tablet 5  . fluconazole (DIFLUCAN) 150 MG tablet TAKE 1 TABLET BY MOUTH ONE TIME PER WEEK 4 tablet 1   No facility-administered medications prior to visit.    Allergies  Allergen Reactions  . Sulfonamide Derivatives     Review of Systems  Constitutional: Positive for malaise/fatigue. Negative for fever.  HENT: Negative for congestion.   Eyes: Negative for blurred vision.  Respiratory: Negative for shortness of breath.   Cardiovascular: Negative for chest pain, palpitations and leg swelling.  Gastrointestinal: Negative for abdominal pain, blood in stool and nausea.  Genitourinary: Negative for dysuria and frequency.  Musculoskeletal: Positive for myalgias. Negative for falls.  Skin: Negative for rash.  Neurological: Negative for dizziness, loss of consciousness and headaches.  Endo/Heme/Allergies: Negative for environmental allergies.  Psychiatric/Behavioral: Negative for depression. The patient is not nervous/anxious.        Objective:    Physical Exam Vitals and nursing note reviewed.  Constitutional:      General: She is not in acute distress.    Appearance: She is well-developed.  HENT:     Head: Normocephalic and atraumatic.     Nose: Nose normal.  Eyes:     General:        Right eye: No discharge.        Left eye: No discharge.  Cardiovascular:     Rate and Rhythm: Normal rate and regular rhythm.     Heart sounds: No murmur.  Pulmonary:     Effort: Pulmonary effort is normal.     Breath sounds: Normal breath sounds.    Abdominal:     General: Bowel sounds are normal.     Palpations: Abdomen is soft.     Tenderness: There is no abdominal tenderness.  Musculoskeletal:     Cervical back: Normal range of motion and neck supple.  Skin:    General: Skin is warm and dry.     Comments: Lesion in rectal cleft on left oblong roughtly 2 x 5 cm firm and endurated, tender, red, no head or opening.   Neurological:  Mental Status: She is alert and oriented to person, place, and time.     BP 112/78 (BP Location: Left Arm, Cuff Size: Large)   Pulse 74   Temp (!) 97.5 F (36.4 C) (Temporal)   Resp 12   Ht 5\' 4"  (1.626 m)   Wt 223 lb 3.2 oz (101.2 kg)   LMP 02/13/2020   SpO2 98%   BMI 38.31 kg/m  Wt Readings from Last 3 Encounters:  02/22/20 223 lb 3.2 oz (101.2 kg)  11/04/19 220 lb (99.8 kg)  04/05/19 223 lb 3.2 oz (101.2 kg)    Diabetic Foot Exam - Simple   No data filed     Lab Results  Component Value Date   WBC 6.9 04/10/2017   HGB 12.5 04/10/2017   HCT 37.2 04/10/2017   PLT 327.0 04/10/2017   GLUCOSE 99 04/10/2017   CHOL 171 04/10/2017   TRIG 90.0 04/10/2017   HDL 58.60 04/10/2017   LDLDIRECT 110.1 10/19/2012   LDLCALC 94 04/10/2017   ALT 36 (H) 04/10/2017   AST 28 04/10/2017   NA 137 04/10/2017   K 4.4 04/10/2017   CL 105 04/10/2017   CREATININE 0.86 04/10/2017   BUN 15 04/10/2017   CO2 23 04/10/2017   TSH 2.61 04/10/2017    Lab Results  Component Value Date   TSH 2.61 04/10/2017   Lab Results  Component Value Date   WBC 6.9 04/10/2017   HGB 12.5 04/10/2017   HCT 37.2 04/10/2017   MCV 88.5 04/10/2017   PLT 327.0 04/10/2017   Lab Results  Component Value Date   NA 137 04/10/2017   K 4.4 04/10/2017   CO2 23 04/10/2017   GLUCOSE 99 04/10/2017   BUN 15 04/10/2017   CREATININE 0.86 04/10/2017   BILITOT 0.3 04/10/2017   ALKPHOS 53 04/10/2017   AST 28 04/10/2017   ALT 36 (H) 04/10/2017   PROT 6.9 04/10/2017   ALBUMIN 4.3 04/10/2017   CALCIUM 9.2 04/10/2017    GFR 82.42 04/10/2017   Lab Results  Component Value Date   CHOL 171 04/10/2017   Lab Results  Component Value Date   HDL 58.60 04/10/2017   Lab Results  Component Value Date   LDLCALC 94 04/10/2017   Lab Results  Component Value Date   TRIG 90.0 04/10/2017   Lab Results  Component Value Date   CHOLHDL 3 04/10/2017   No results found for: HGBA1C     Assessment & Plan:   Problem List Items Addressed This Visit    ADD (attention deficit disorder)    Paperwork completed for Vyvanse patient assistance      Rectal abscess    Very painful and enlarging over several weeks then began to escalate about 4 days ago. Malaise, fatigue. Started on antibiotics, given Ceftriaxone and started on keflex, referred to general surgery. Take Sitz baths 15 minutes bid with bleach 1 tbls in water. Scrub with h2o2.  For pain take tylenol ES 500 mg three times a day and Ibuprofen 400 mg three x a day      Relevant Orders   Ambulatory referral to General Surgery      I have discontinued Lexany Aul's fluconazole. I am also having her start on cephALEXin, fluconazole, and mupirocin ointment. Additionally, I am having her maintain her rizatriptan, Kariva, valACYclovir, tiZANidine, citalopram, lisdexamfetamine, lisdexamfetamine, and lisdexamfetamine. We administered cefTRIAXone.  Meds ordered this encounter  Medications  . cephALEXin (KEFLEX) 500 MG capsule    Sig: Take 1  capsule (500 mg total) by mouth 4 (four) times daily.    Dispense:  40 capsule    Refill:  0  . cefTRIAXone (ROCEPHIN) injection 1 g  . fluconazole (DIFLUCAN) 150 MG tablet    Sig: Take 1 tablet (150 mg total) by mouth once a week.    Dispense:  2 tablet    Refill:  1  . mupirocin ointment (BACTROBAN) 2 %    Sig: Place 1 application into the nose 2 (two) times daily as needed.    Dispense:  22 g    Refill:  0     Penni Homans, MD

## 2020-02-23 ENCOUNTER — Encounter: Payer: Self-pay | Admitting: Family Medicine

## 2020-02-25 DIAGNOSIS — K610 Anal abscess: Secondary | ICD-10-CM | POA: Diagnosis not present

## 2020-02-25 NOTE — Telephone Encounter (Signed)
Patient called and stated that she has an appointment with [facility] at 330pm to get abscess lanced and that she really wants to come to see Dr. Charlett Blake.  Advised that Dr. Charlett Blake is not in office today and for her to keep appointment and that they will take care of abscess and provide with medication. Advised that lancing will help with pain as well.

## 2020-03-08 ENCOUNTER — Other Ambulatory Visit: Payer: Self-pay | Admitting: Family Medicine

## 2020-03-08 ENCOUNTER — Encounter: Payer: Self-pay | Admitting: Family Medicine

## 2020-03-08 MED ORDER — LEVOFLOXACIN 500 MG PO TABS
500.0000 mg | ORAL_TABLET | Freq: Every day | ORAL | 0 refills | Status: DC
Start: 2020-03-08 — End: 2020-03-28

## 2020-03-15 ENCOUNTER — Other Ambulatory Visit: Payer: Self-pay | Admitting: Family Medicine

## 2020-03-15 DIAGNOSIS — K610 Anal abscess: Secondary | ICD-10-CM | POA: Diagnosis not present

## 2020-03-28 ENCOUNTER — Other Ambulatory Visit: Payer: Self-pay

## 2020-03-28 ENCOUNTER — Ambulatory Visit: Payer: BC Managed Care – PPO | Admitting: Family Medicine

## 2020-03-28 DIAGNOSIS — K5900 Constipation, unspecified: Secondary | ICD-10-CM | POA: Diagnosis not present

## 2020-03-28 DIAGNOSIS — B349 Viral infection, unspecified: Secondary | ICD-10-CM | POA: Diagnosis not present

## 2020-03-28 DIAGNOSIS — K611 Rectal abscess: Secondary | ICD-10-CM

## 2020-03-28 DIAGNOSIS — G43809 Other migraine, not intractable, without status migrainosus: Secondary | ICD-10-CM

## 2020-03-28 DIAGNOSIS — F988 Other specified behavioral and emotional disorders with onset usually occurring in childhood and adolescence: Secondary | ICD-10-CM

## 2020-03-28 MED ORDER — VALACYCLOVIR HCL 1 G PO TABS: 2000.0000 mg | ORAL_TABLET | Freq: Two times a day (BID) | ORAL | 5 refills | Status: DC

## 2020-03-28 MED ORDER — RIZATRIPTAN BENZOATE 10 MG PO TABS: ORAL_TABLET | ORAL | 3 refills | Status: DC

## 2020-03-28 NOTE — Progress Notes (Signed)
Subjective:    Patient ID: Bonnie Lewis, female    DOB: 06-27-1986, 34 y.o.   MRN: [id number]  Chief Complaint  Patient presents with  . ADHD    HPI Patient is in today for follow up on chronic medical concerns. No recent febrile illness but she continues to struggle with fatigue and malaise as her rectal abscess continues to fester. She has now been referred to a colorectal surgeon, Dr Marcello Moores. Notes mild trouble with constipation with smaller and harder stool burden. Denies CP/palp/SOB/HA/congestion/fevers or GU c/o. Taking meds as prescribed  Past Medical History:  Diagnosis Date  . Acne 04/14/2015  . ADD (attention deficit disorder)   . Anemia 09/12/2011  . Bronchitis 08/11/2011  . Cervical cancer ([place]) 10/19/2012  . Cervical cancer screening 10/19/2012  . Dyslipidemia 10/20/2012  . Dysmenorrhea 10/20/2012  . Insomnia 09/12/2011  . LLQ pain 08/11/2011  . Migraine 05/14/2013  . Obesity 06/27/2010   Qualifier: Diagnosis of  By: Domingo Cocking RMA, Christy    . Preventative health care 09/16/2011  . Seasonal affective disorder ([place]) 10/19/2012  . Vaginitis 10/19/2012  . Viral pharyngitis 01/10/2016    No past surgical history on file.  Family History  Problem Relation Age of Onset  . Asthma Father   . Depression Father        and anxiety  . Allergies Father   . Irritable bowel syndrome Father   . Dementia Father   . Depression Sister   . Irritable bowel syndrome Sister   . Migraines Sister   . Depression Brother   . Allergies Brother   . Depression Maternal Grandmother        Anxiety  . Hypertension Maternal Grandmother   . ADD / ADHD Maternal Grandmother   . Menstrual problems Maternal Grandmother        heavy bleeding requiring hysterectomy  . Depression Paternal Grandmother   . Cardiomyopathy Paternal Grandmother   . Menstrual problems Paternal Grandmother   . Hyperlipidemia Mother   . Anemia Sister   . Coronary artery disease Paternal Grandfather   . Cancer Paternal Aunt   .  Brain cancer Maternal Aunt     Social History   Socioeconomic History  . Marital status: Single    Spouse name: Not on file  . Number of children: Not on file  . Years of education: Not on file  . Highest education level: Not on file  Occupational History  . Not on file  Tobacco Use  . Smoking status: Never Smoker  . Smokeless tobacco: Never Used  Vaping Use  . Vaping Use: Never used  Substance and Sexual Activity  . Alcohol use: Yes    Comment: Sociallly   . Drug use: No  . Sexual activity: Yes    Comment: lives at home to help her mother with her father who has early onset Alzheimer's  Other Topics Concern  . Not on file  Social History Narrative  . Not on file   Social Determinants of Health   Financial Resource Strain:   . Difficulty of Paying Living Expenses:   Food Insecurity:   . Worried About Charity fundraiser in the Last Year:   . Arboriculturist in the Last Year:   Transportation Needs:   . Film/video editor (Medical):   Marland Kitchen Lack of Transportation (Non-Medical):   Physical Activity:   . Days of Exercise per Week:   . Minutes of Exercise per Session:   Stress:   .  Feeling of Stress :   Social Connections:   . Frequency of Communication with Friends and Family:   . Frequency of Social Gatherings with Friends and Family:   . Attends Religious Services:   . Active Member of Clubs or Organizations:   . Attends Archivist Meetings:   Marland Kitchen Marital Status:   Intimate Partner Violence:   . Fear of Current or Ex-Partner:   . Emotionally Abused:   Marland Kitchen Physically Abused:   . Sexually Abused:     Outpatient Medications Prior to Visit  Medication Sig Dispense Refill  . citalopram (CELEXA) 20 MG tablet TAKE 1 AND 1/2 TABLETS BY MOUTH DAILY 135 tablet 1  . fluconazole (DIFLUCAN) 150 MG tablet Take 1 tablet (150 mg total) by mouth once a week. 2 tablet 1  . KARIVA 0.15-0.02/0.01 MG (21/5) tablet TAKE 1 TABLET BY MOUTH EVERY DAY 84 tablet 3  .  lisdexamfetamine (VYVANSE) 70 MG capsule Take 1 capsule (70 mg total) by mouth daily. July 2021 30 capsule 0  . lisdexamfetamine (VYVANSE) 70 MG capsule Take 1 capsule (70 mg total) by mouth daily. June 2021 30 capsule 0  . lisdexamfetamine (VYVANSE) 70 MG capsule Take 1 capsule (70 mg total) by mouth daily. May 2021 30 capsule 0  . mupirocin ointment (BACTROBAN) 2 % Place 1 application into the nose 2 (two) times daily as needed. 22 g 0  . tiZANidine (ZANAFLEX) 2 MG tablet TAKE 1/2 TO 2 TABLET BY MOUTH TWICE A DAY AS NEEDED FOR MUSCLE SPASM 90 tablet 1  . rizatriptan (MAXALT) 10 MG tablet TAKE 1 TABLET (10 MG TOTAL) BY MOUTH AS NEEDED FOR MIGRAINE. MAY REPEAT IN 2 HOURS IF NEEDED 10 tablet 3  . valACYclovir (VALTREX) 1000 MG tablet Take 2 tablets (2,000 mg total) by mouth 2 (two) times daily. 120 tablet 5  . levofloxacin (LEVAQUIN) 500 MG tablet Take 1 tablet (500 mg total) by mouth daily. 7 tablet 0   No facility-administered medications prior to visit.    Allergies  Allergen Reactions  . Sulfonamide Derivatives     Review of Systems  Constitutional: Positive for malaise/fatigue. Negative for fever.  HENT: Negative for congestion.   Eyes: Negative for blurred vision.  Respiratory: Negative for shortness of breath.   Cardiovascular: Negative for chest pain, palpitations and leg swelling.  Gastrointestinal: Positive for constipation. Negative for abdominal pain, blood in stool and nausea.  Genitourinary: Negative for dysuria and frequency.  Musculoskeletal: Negative for falls.  Skin: Negative for rash.  Neurological: Negative for dizziness, loss of consciousness and headaches.  Endo/Heme/Allergies: Negative for environmental allergies.  Psychiatric/Behavioral: Negative for depression. The patient is not nervous/anxious.        Objective:    Physical Exam Vitals and nursing note reviewed.  Constitutional:      General: She is not in acute distress.    Appearance: She is  well-developed.  HENT:     Head: Normocephalic and atraumatic.     Nose: Nose normal.  Eyes:     General:        Right eye: No discharge.        Left eye: No discharge.  Cardiovascular:     Rate and Rhythm: Normal rate and regular rhythm.     Heart sounds: No murmur heard.   Pulmonary:     Effort: Pulmonary effort is normal.     Breath sounds: Normal breath sounds.  Abdominal:     General: Bowel sounds are normal.  Palpations: Abdomen is soft.     Tenderness: There is no abdominal tenderness.  Musculoskeletal:     Cervical back: Normal range of motion and neck supple.  Skin:    General: Skin is warm and dry.  Neurological:     Mental Status: She is alert and oriented to person, place, and time.     BP 120/82 (BP Location: Left Arm, Cuff Size: Large)   Pulse 60   Temp (!) 97.4 F (36.3 C) (Temporal)   Resp 12   Ht 5\' 4"  (1.626 m)   Wt 226 lb 9.6 oz (102.8 kg)   SpO2 98%   BMI 38.90 kg/m  Wt Readings from Last 3 Encounters:  03/28/20 226 lb 9.6 oz (102.8 kg)  02/22/20 223 lb 3.2 oz (101.2 kg)  11/04/19 220 lb (99.8 kg)    Diabetic Foot Exam - Simple   No data filed     Lab Results  Component Value Date   WBC 6.9 04/10/2017   HGB 12.5 04/10/2017   HCT 37.2 04/10/2017   PLT 327.0 04/10/2017   GLUCOSE 99 04/10/2017   CHOL 171 04/10/2017   TRIG 90.0 04/10/2017   HDL 58.60 04/10/2017   LDLDIRECT 110.1 10/19/2012   LDLCALC 94 04/10/2017   ALT 36 (H) 04/10/2017   AST 28 04/10/2017   NA 137 04/10/2017   K 4.4 04/10/2017   CL 105 04/10/2017   CREATININE 0.86 04/10/2017   BUN 15 04/10/2017   CO2 23 04/10/2017   TSH 2.61 04/10/2017    Lab Results  Component Value Date   TSH 2.61 04/10/2017   Lab Results  Component Value Date   WBC 6.9 04/10/2017   HGB 12.5 04/10/2017   HCT 37.2 04/10/2017   MCV 88.5 04/10/2017   PLT 327.0 04/10/2017   Lab Results  Component Value Date   NA 137 04/10/2017   K 4.4 04/10/2017   CO2 23 04/10/2017   GLUCOSE 99  04/10/2017   BUN 15 04/10/2017   CREATININE 0.86 04/10/2017   BILITOT 0.3 04/10/2017   ALKPHOS 53 04/10/2017   AST 28 04/10/2017   ALT 36 (H) 04/10/2017   PROT 6.9 04/10/2017   ALBUMIN 4.3 04/10/2017   CALCIUM 9.2 04/10/2017   GFR 82.42 04/10/2017   Lab Results  Component Value Date   CHOL 171 04/10/2017   Lab Results  Component Value Date   HDL 58.60 04/10/2017   Lab Results  Component Value Date   LDLCALC 94 04/10/2017   Lab Results  Component Value Date   TRIG 90.0 04/10/2017   Lab Results  Component Value Date   CHOLHDL 3 04/10/2017   No results found for: HGBA1C     Assessment & Plan:   Problem List Items Addressed This Visit    Migraine    Encouraged increased hydration, 64 ounces of clear fluids daily. Minimize alcohol and caffeine. Eat small frequent meals with lean proteins and complex carbs. Avoid high and low blood sugars. Get adequate sleep, 7-8 hours a night. Needs exercise daily preferably in the morning. refilled for meds for HA      Relevant Medications   rizatriptan (MAXALT) 10 MG tablet   ADD (attention deficit disorder)    Is doing well on Vyvanse no changes      Rectal abscess    She has now had 2 rounds of antibiotics and a surgical debridement and yet it still has not healed so she has been referred to a colorectal surgeon, Dr Marcello Moores  and she has a consultation on 6/21.      Constipation    Mild Encouraged increased hydration and fiber in diet. Daily probiotics. If bowels not moving can use MOM 2 tbls po in 4 oz of warm prune juice by mouth every 2-3 days. If no results then repeat in 4 hours with  Dulcolax suppository pr, may repeat again in 4 more hours as needed. Seek care if symptoms worsen. Consider daily Miralax and/or Dulcolax if symptoms persist. Add Benefiber daily to a beverage      Viral illness    Refill given on Valtrex.       Relevant Medications   valACYclovir (VALTREX) 1000 MG tablet      I have discontinued Blue  Gallicchio's levofloxacin. I am also having her maintain her Kariva, tiZANidine, citalopram, lisdexamfetamine, lisdexamfetamine, lisdexamfetamine, fluconazole, mupirocin ointment, rizatriptan, and valACYclovir.  Meds ordered this encounter  Medications  . rizatriptan (MAXALT) 10 MG tablet    Sig: TAKE 1 TABLET (10 MG TOTAL) BY MOUTH AS NEEDED FOR MIGRAINE. MAY REPEAT IN 2 HOURS IF NEEDED    Dispense:  10 tablet    Refill:  3  . valACYclovir (VALTREX) 1000 MG tablet    Sig: Take 2 tablets (2,000 mg total) by mouth 2 (two) times daily.    Dispense:  120 tablet    Refill:  5     Penni Homans, MD

## 2020-03-28 NOTE — Assessment & Plan Note (Signed)
She has now had 2 rounds of antibiotics and a surgical debridement and yet it still has not healed so she has been referred to a colorectal surgeon, Dr Marcello Moores and she has a consultation on 6/21.

## 2020-03-28 NOTE — Assessment & Plan Note (Signed)
Encouraged increased hydration, 64 ounces of clear fluids daily. Minimize alcohol and caffeine. Eat small frequent meals with lean proteins and complex carbs. Avoid high and low blood sugars. Get adequate sleep, 7-8 hours a night. Needs exercise daily preferably in the morning. refilled for meds for HA

## 2020-03-28 NOTE — Assessment & Plan Note (Signed)
Mild Encouraged increased hydration and fiber in diet. Daily probiotics. If bowels not moving can use MOM 2 tbls po in 4 oz of warm prune juice by mouth every 2-3 days. If no results then repeat in 4 hours with  Dulcolax suppository pr, may repeat again in 4 more hours as needed. Seek care if symptoms worsen. Consider daily Miralax and/or Dulcolax if symptoms persist. Add Benefiber daily to a beverage

## 2020-03-28 NOTE — Patient Instructions (Signed)
Encouraged increased hydration and fiber in diet. Daily probiotics. If bowels not moving can use MOM 2 tbls po in 4 oz of warm prune juice by mouth every 2-3 days. If no results then repeat in 4 hours with  Dulcolax suppository pr, may repeat again in 4 more hours as needed. Seek care if symptoms worsen. Consider daily Miralax and/or Dulcolax if symptoms persist.  Anorectal Abscess An abscess is an infected area that contains a collection of pus. An anorectal abscess is an abscess that is near the opening of the anus or around the rectum. Without treatment, an anorectal abscess can become larger and cause other problems, such as a more serious body-wide infection or pain, especially during bowel movements. What are the causes? This condition is caused by plugged glands or an infection in one of these areas:  The anus.  The area between the anus and the scrotum in males or between the anus and the vagina in females (perineum). What increases the risk? The following factors may make you more likely to develop this condition:  Diabetes or inflammatory bowel disease.  Having a body defense system (immune system) that is weak.  Engaging in anal sex.  Having a sexually transmitted infection (STI).  Certain kinds of cancer, such as rectal carcinoma, leukemia, or lymphoma. What are the signs or symptoms? The main symptom of this condition is pain. The pain may be a throbbing pain that gets worse during bowel movements. Other symptoms include:  Swelling and redness in the area of the abscess. The redness may go beyond the abscess and appear as a red streak on the skin.  A visible, painful lump, or a lump that can be felt when touched.  Bleeding or pus-like discharge from the area.  Fever.  General weakness.  Constipation.  Diarrhea. How is this diagnosed? This condition is diagnosed based on your medical history and a physical exam of the affected area.  This may involve examining the  rectal area with a gloved hand (digital rectal exam).  Sometimes, the health care provider needs to look into the rectum using a probe, scope, or imaging test.  For women, it may require a careful vaginal exam. How is this treated? Treatment for this condition may include:  Incision and drainage surgery. This involves making an incision over the abscess to drain the pus.  Medicines, including antibiotic medicine, pain medicine, stool softeners, or laxatives. Follow these instructions at home: Medicines  Take over-the-counter and prescription medicines only as told by your health care provider.  If you were prescribed an antibiotic medicine, use it as told by your health care provider. Do not stop using the antibiotic even if you start to feel better.  Do not drive or use heavy machinery while taking prescription pain medicine. Wound care   If gauze was used in the abscess, follow instructions from your health care provider about removing or changing the gauze. It can usually be removed in 2-3 days.  Wash your hands with soap and water before you remove or change your gauze. If soap and water are not available, use hand sanitizer.  If one or more drains were placed in the abscess cavity, be careful not to pull at them. Your health care provider will tell you how long they need to remain in place.  Check your incision area every day for signs of infection. Check for: ? More redness, swelling, or pain. ? More fluid or blood. ? Warmth. ? Pus or a bad  smell. Managing pain, stiffness, and swelling   Take a sitz bath 3-4 times a day and after bowel movements. This will help reduce pain and swelling.  To relieve pain, try sitting: ? On a heating pad with the setting on low. ? On an inflatable donut-shaped cushion.  If directed, put ice on the affected area: ? Put ice in a plastic bag. ? Place a towel between your skin and the bag. ? Leave the ice on for 20 minutes, 2-3 times a  day. General instructions  Follow any diet instructions given by your health care provider.  Keep all follow-up visits as told by your health care provider. This is important. Contact a health care provider if you have:  Bleeding from your incision.  Pain, swelling, or redness that does not improve or gets worse.  Trouble passing stool or urine.  Symptoms that return after treatment. Get help right away if you:  Have problems moving or using your legs.  Have severe or increasing pain.  Have swelling in the affected area that suddenly gets worse.  Have a large increase in bleeding or passing of pus.  Develop chills or a fever. Summary  An anorectal abscess is an abscess that is near the opening of the anus or around the rectum. An abscess is an infected area that contains a collection of pus.  The main symptom of this condition is pain. It may be a throbbing pain that gets worse during bowel movements.  Treatment for an anorectal abscess may include surgery to drain the pus from the abscess. Medicines and sitz baths may also be a part of your treatment plan. This information is not intended to replace advice given to you by your health care provider. Make sure you discuss any questions you have with your health care provider. Document Revised: 11/06/2017 Document Reviewed: 11/06/2017 Elsevier Patient Education  2020 [employer].

## 2020-03-28 NOTE — Assessment & Plan Note (Signed)
Is doing well on Vyvanse no changes

## 2020-03-28 NOTE — Assessment & Plan Note (Signed)
Refill given on Valtrex.

## 2020-04-03 DIAGNOSIS — K603 Anal fistula: Secondary | ICD-10-CM | POA: Diagnosis not present

## 2020-05-16 ENCOUNTER — Encounter: Payer: Self-pay | Admitting: Family Medicine

## 2020-05-17 ENCOUNTER — Other Ambulatory Visit: Payer: Self-pay | Admitting: Family Medicine

## 2020-05-17 DIAGNOSIS — F988 Other specified behavioral and emotional disorders with onset usually occurring in childhood and adolescence: Secondary | ICD-10-CM

## 2020-05-17 MED ORDER — LISDEXAMFETAMINE DIMESYLATE 70 MG PO CAPS: 70.0000 mg | ORAL_CAPSULE | Freq: Every day | ORAL | 0 refills | Status: DC

## 2020-05-19 DIAGNOSIS — K603 Anal fistula: Secondary | ICD-10-CM | POA: Diagnosis not present

## 2020-05-22 ENCOUNTER — Other Ambulatory Visit: Payer: Self-pay | Admitting: Family Medicine

## 2020-05-22 ENCOUNTER — Other Ambulatory Visit: Payer: Self-pay | Admitting: General Surgery

## 2020-05-23 NOTE — Progress Notes (Signed)
Spoke with kelly at [facility] no other phone number message x 3 left, kelly to call pt

## 2020-05-25 ENCOUNTER — Encounter (HOSPITAL_BASED_OUTPATIENT_CLINIC_OR_DEPARTMENT_OTHER): Payer: Self-pay | Admitting: General Surgery

## 2020-05-25 ENCOUNTER — Other Ambulatory Visit: Payer: Self-pay

## 2020-05-25 ENCOUNTER — Other Ambulatory Visit (HOSPITAL_COMMUNITY)
Admission: RE | Admit: 2020-05-25 | Discharge: 2020-05-25 | Disposition: A | Discharge status: Home / Self Care | Payer: BC Managed Care – PPO | Source: Ambulatory Visit | Attending: General Surgery | Admitting: General Surgery

## 2020-05-25 DIAGNOSIS — Z01812 Encounter for preprocedural laboratory examination: Secondary | ICD-10-CM | POA: Diagnosis not present

## 2020-05-25 DIAGNOSIS — Z20822 Contact with and (suspected) exposure to covid-19: Secondary | ICD-10-CM | POA: Insufficient documentation

## 2020-05-25 LAB — SARS CORONAVIRUS 2 (TAT 6-24 HRS): SARS Coronavirus 2: NEGATIVE

## 2020-05-25 NOTE — Progress Notes (Signed)
Spoke w/ via phone for pre-op interview--- PT Lab needs dos----  Urine preg             Lab results------ no COVID test ------ 05-25-2020 @ 1400 Arrive at ------- 1045 NPO after MN NO Solid Food.  Clear liquids from MN until--- 0945 Medications to take morning of surgery ----- Celexa, Minerva Fester, and if needed take maxalt w/ sips of water Diabetic medication ----- n/a Patient Special Instructions ----- n/a Pre-Op special Istructions ----- n/a Patient verbalized understanding of instructions that were given at this phone interview. Patient denies shortness of breath, chest pain, fever, cough at this phone interview.

## 2020-05-29 ENCOUNTER — Ambulatory Visit (HOSPITAL_BASED_OUTPATIENT_CLINIC_OR_DEPARTMENT_OTHER): Payer: BC Managed Care – PPO | Admitting: Certified Registered"

## 2020-05-29 ENCOUNTER — Ambulatory Visit (HOSPITAL_BASED_OUTPATIENT_CLINIC_OR_DEPARTMENT_OTHER)
Admission: RE | Admit: 2020-05-29 | Discharge: 2020-05-29 | Disposition: A | Discharge status: Home / Self Care | Payer: BC Managed Care – PPO | Attending: General Surgery | Admitting: General Surgery

## 2020-05-29 ENCOUNTER — Encounter (HOSPITAL_BASED_OUTPATIENT_CLINIC_OR_DEPARTMENT_OTHER): Payer: Self-pay | Admitting: General Surgery

## 2020-05-29 ENCOUNTER — Other Ambulatory Visit: Payer: Self-pay

## 2020-05-29 ENCOUNTER — Encounter (HOSPITAL_BASED_OUTPATIENT_CLINIC_OR_DEPARTMENT_OTHER)
Admission: RE | Disposition: A | Discharge status: Home / Self Care | Payer: Self-pay | Source: Home / Self Care | Attending: General Surgery

## 2020-05-29 DIAGNOSIS — F988 Other specified behavioral and emotional disorders with onset usually occurring in childhood and adolescence: Secondary | ICD-10-CM | POA: Diagnosis not present

## 2020-05-29 DIAGNOSIS — Z79899 Other long term (current) drug therapy: Secondary | ICD-10-CM | POA: Diagnosis not present

## 2020-05-29 DIAGNOSIS — Z6838 Body mass index (BMI) 38.0-38.9, adult: Secondary | ICD-10-CM | POA: Diagnosis not present

## 2020-05-29 DIAGNOSIS — K603 Anal fistula: Secondary | ICD-10-CM | POA: Insufficient documentation

## 2020-05-29 DIAGNOSIS — G43909 Migraine, unspecified, not intractable, without status migrainosus: Secondary | ICD-10-CM | POA: Insufficient documentation

## 2020-05-29 DIAGNOSIS — Z793 Long term (current) use of hormonal contraceptives: Secondary | ICD-10-CM | POA: Insufficient documentation

## 2020-05-29 DIAGNOSIS — F329 Major depressive disorder, single episode, unspecified: Secondary | ICD-10-CM | POA: Insufficient documentation

## 2020-05-29 DIAGNOSIS — E782 Mixed hyperlipidemia: Secondary | ICD-10-CM | POA: Diagnosis not present

## 2020-05-29 DIAGNOSIS — G4700 Insomnia, unspecified: Secondary | ICD-10-CM | POA: Diagnosis not present

## 2020-05-29 HISTORY — DX: Anal fistula, unspecified: K60.30

## 2020-05-29 HISTORY — PX: ANAL FISTULOTOMY: SHX6423

## 2020-05-29 HISTORY — DX: Presence of spectacles and contact lenses: Z97.3

## 2020-05-29 HISTORY — DX: Anal fistula: K60.3

## 2020-05-29 LAB — POCT PREGNANCY, URINE: Preg Test, Ur: NEGATIVE

## 2020-05-29 SURGERY — ANAL FISTULOTOMY
Anesthesia: Monitor Anesthesia Care | Site: Anus

## 2020-05-29 MED ORDER — ACETAMINOPHEN 500 MG PO TABS
ORAL_TABLET | ORAL | Status: AC
  Filled 2020-05-29: qty 2

## 2020-05-29 MED ORDER — MIDAZOLAM HCL 5 MG/5ML IJ SOLN
INTRAMUSCULAR | Status: DC | PRN
  Administered 2020-05-29: 2 mg via INTRAVENOUS

## 2020-05-29 MED ORDER — BUPIVACAINE-EPINEPHRINE 0.5% -1:200000 IJ SOLN
INTRAMUSCULAR | Status: DC | PRN
  Administered 2020-05-29: 30 mL

## 2020-05-29 MED ORDER — BUPIVACAINE LIPOSOME 1.3 % IJ SUSP
INTRAMUSCULAR | Status: AC
  Filled 2020-05-29: qty 20

## 2020-05-29 MED ORDER — ACETAMINOPHEN 500 MG PO TABS
1000.0000 mg | ORAL_TABLET | Freq: Once | ORAL | Status: AC
  Administered 2020-05-29: 1000 mg via ORAL

## 2020-05-29 MED ORDER — DEXMEDETOMIDINE (PRECEDEX) IN NS 20 MCG/5ML (4 MCG/ML) IV SYRINGE
PREFILLED_SYRINGE | INTRAVENOUS | Status: DC | PRN
  Administered 2020-05-29 (×3): 4 ug via INTRAVENOUS

## 2020-05-29 MED ORDER — LACTATED RINGERS IV SOLN: INTRAVENOUS | Status: DC

## 2020-05-29 MED ORDER — LIDOCAINE HCL (CARDIAC) PF 100 MG/5ML IV SOSY
PREFILLED_SYRINGE | INTRAVENOUS | Status: DC | PRN
  Administered 2020-05-29: 50 mg via INTRAVENOUS

## 2020-05-29 MED ORDER — HYDROCODONE-ACETAMINOPHEN 5-325 MG PO TABS: 1.0000 | ORAL_TABLET | Freq: Four times a day (QID) | ORAL | 0 refills | Status: DC | PRN

## 2020-05-29 MED ORDER — ONDANSETRON HCL 4 MG/2ML IJ SOLN
INTRAMUSCULAR | Status: DC | PRN
  Administered 2020-05-29: 4 mg via INTRAVENOUS

## 2020-05-29 MED ORDER — PROPOFOL 10 MG/ML IV BOLUS
INTRAVENOUS | Status: AC
  Filled 2020-05-29: qty 20

## 2020-05-29 MED ORDER — SODIUM CHLORIDE 0.9% FLUSH: 3.0000 mL | Freq: Two times a day (BID) | INTRAVENOUS | Status: DC

## 2020-05-29 MED ORDER — LIDOCAINE 5 % EX OINT
TOPICAL_OINTMENT | CUTANEOUS | Status: DC | PRN
  Administered 2020-05-29: 1

## 2020-05-29 MED ORDER — LIDOCAINE 2% (20 MG/ML) 5 ML SYRINGE
INTRAMUSCULAR | Status: AC
  Filled 2020-05-29: qty 5

## 2020-05-29 MED ORDER — PROPOFOL 500 MG/50ML IV EMUL
INTRAVENOUS | Status: DC | PRN
  Administered 2020-05-29: 125 ug/kg/min via INTRAVENOUS

## 2020-05-29 MED ORDER — MIDAZOLAM HCL 2 MG/2ML IJ SOLN
INTRAMUSCULAR | Status: AC
  Filled 2020-05-29: qty 2

## 2020-05-29 SURGICAL SUPPLY — 49 items
APL SKNCLS STERI-STRIP NONHPOA (GAUZE/BANDAGES/DRESSINGS) ×4
BENZOIN TINCTURE PRP APPL 2/3 (GAUZE/BANDAGES/DRESSINGS) ×6 IMPLANT
BLADE EXTENDED COATED 6.5IN (ELECTRODE) IMPLANT
BLADE HEX COATED 2.75 (ELECTRODE) ×3 IMPLANT
BLADE SURG 10 STRL SS (BLADE) IMPLANT
BRIEF STRETCH FOR OB PAD LRG (UNDERPADS AND DIAPERS) ×3 IMPLANT
CANISTER SUCT 3000ML PPV (MISCELLANEOUS) ×3 IMPLANT
COVER BACK TABLE 60X90IN (DRAPES) ×3 IMPLANT
COVER MAYO STAND STRL (DRAPES) ×3 IMPLANT
COVER WAND RF STERILE (DRAPES) ×3 IMPLANT
DECANTER SPIKE VIAL GLASS SM (MISCELLANEOUS) ×3 IMPLANT
DRAPE LAPAROTOMY 100X72 PEDS (DRAPES) ×3 IMPLANT
DRAPE UTILITY XL STRL (DRAPES) ×3 IMPLANT
DRSG PAD ABDOMINAL 8X10 ST (GAUZE/BANDAGES/DRESSINGS) ×3 IMPLANT
ELECT REM PT RETURN 9FT ADLT (ELECTROSURGICAL) ×3
ELECTRODE REM PT RTRN 9FT ADLT (ELECTROSURGICAL) ×2 IMPLANT
GAUZE SPONGE 4X4 12PLY STRL (GAUZE/BANDAGES/DRESSINGS) ×3 IMPLANT
GLOVE BIO SURGEON STRL SZ 6.5 (GLOVE) ×3 IMPLANT
GLOVE BIOGEL PI IND STRL 7.0 (GLOVE) ×2 IMPLANT
GLOVE BIOGEL PI INDICATOR 7.0 (GLOVE) ×1
GOWN STRL REUS W/TWL XL LVL3 (GOWN DISPOSABLE) ×3 IMPLANT
HYDROGEN PEROXIDE 16OZ (MISCELLANEOUS) ×3 IMPLANT
IV CATH 14GX2 1/4 (CATHETERS) ×3 IMPLANT
IV CATH 18G SAFETY (IV SOLUTION) ×3 IMPLANT
KIT SIGMOIDOSCOPE (SET/KITS/TRAYS/PACK) IMPLANT
KIT TURNOVER CYSTO (KITS) ×3 IMPLANT
LOOP VESSEL MAXI BLUE (MISCELLANEOUS) ×3 IMPLANT
NEEDLE HYPO 22GX1.5 SAFETY (NEEDLE) ×3 IMPLANT
NS IRRIG 500ML POUR BTL (IV SOLUTION) ×3 IMPLANT
PACK BASIN DAY SURGERY FS (CUSTOM PROCEDURE TRAY) ×3 IMPLANT
PAD ARMBOARD 7.5X6 YLW CONV (MISCELLANEOUS) ×3 IMPLANT
PENCIL SMOKE EVACUATOR (MISCELLANEOUS) ×3 IMPLANT
SPONGE HEMORRHOID 8X3CM (HEMOSTASIS) IMPLANT
SPONGE SURGIFOAM ABS GEL 12-7 (HEMOSTASIS) IMPLANT
SUCTION FRAZIER HANDLE 10FR (MISCELLANEOUS)
SUCTION TUBE FRAZIER 10FR DISP (MISCELLANEOUS) IMPLANT
SUT CHROMIC 2 0 SH (SUTURE) ×3 IMPLANT
SUT CHROMIC 3 0 SH 27 (SUTURE) IMPLANT
SUT ETHIBOND 0 (SUTURE) ×3 IMPLANT
SUT VIC AB 2-0 SH 27 (SUTURE)
SUT VIC AB 2-0 SH 27XBRD (SUTURE) IMPLANT
SUT VIC AB 3-0 SH 18 (SUTURE) ×3 IMPLANT
SUT VIC AB 3-0 SH 27 (SUTURE)
SUT VIC AB 3-0 SH 27XBRD (SUTURE) IMPLANT
SYR CONTROL 10ML LL (SYRINGE) ×3 IMPLANT
TOWEL OR 17X26 10 PK STRL BLUE (TOWEL DISPOSABLE) ×3 IMPLANT
TRAY DSU PREP LF (CUSTOM PROCEDURE TRAY) ×3 IMPLANT
TUBE CONNECTING 12X1/4 (SUCTIONS) ×3 IMPLANT
YANKAUER SUCT BULB TIP NO VENT (SUCTIONS) ×3 IMPLANT

## 2020-05-29 NOTE — Discharge Instructions (Addendum)
ANORECTAL SURGERY: POST OP INSTRUCTIONS 1. Take your usually prescribed home medications unless otherwise directed. 2. DIET: During the first few hours after surgery sip on some liquids until you are able to urinate.  It is normal to not urinate for several hours after this surgery.  If you feel uncomfortable, please contact the office for instructions.  After you are able to urinate,you may eat, if you feel like it.  Follow a light bland diet the first 24 hours after arrival home, such as soup, liquids, crackers, etc.  Be sure to include lots of fluids daily (6-8 glasses).  Avoid fast food or heavy meals, as your are more likely to get nauseated.  Eat a low fat diet the next few days after surgery.  Limit caffeine intake to 1-2 servings a day. 3. PAIN CONTROL: a. Pain is best controlled by a usual combination of several different methods TOGETHER: i. Muscle relaxation: Soak in a warm bath (or Sitz bath) three times a day and after bowel movements.  Continue to do this until all pain is resolved ii. Over the counter pain medication iii. Prescription pain medication b. Most patients will experience some swelling and discomfort in the anus/rectal area and incisions.  Heat such as warm towels, sitz baths, warm baths, etc to help relax tight/sore spots and speed recovery.  Some people prefer to use ice, especially in the first couple days after surgery, as it may decrease the pain and swelling, or alternate between ice & heat.  Experiment to what works for you.  Swelling and bruising can take several weeks to resolve.  Pain can take even longer to completely resolve.     Post Anesthesia Home Care Instructions  Activity: Get plenty of rest for the remainder of the day. A responsible individual must stay with you for 24 hours following the procedure.  For the next 24 hours, DO NOT: -Drive a car -Paediatric nurse -Drink alcoholic beverages -Take any medication unless instructed by your  physician -Make any legal decisions or sign important papers.  Meals: Start with liquid foods such as gelatin or soup. Progress to regular foods as tolerated. Avoid greasy, spicy, heavy foods. If nausea and/or vomiting occur, drink only clear liquids until the nausea and/or vomiting subsides. Call your physician if vomiting continues.  Special Instructions/Symptoms: Your throat may feel dry or sore from the anesthesia or the breathing tube placed in your throat during surgery. If this causes discomfort, gargle with warm salt water. The discomfort should disappear within 24 hours.  If you had a scopolamine patch placed behind your ear for the management of post- operative nausea and/or vomiting:  1. The medication in the patch is effective for 72 hours, after which it should be removed.  Wrap patch in a tissue and discard in the trash. Wash hands thoroughly with soap and water. 2. You may remove the patch earlier than 72 hours if you experience unpleasant side effects which may include dry mouth, dizziness or visual disturbances. 3. Avoid touching the patch. Wash your hands with soap and water after contact with the patch.     c. It is helpful to take an over-the-counter pain medication regularly for the first few weeks.  Choose one of the following that works best for you: i. Naproxen (Aleve, etc)  Two 220mg  tabs twice a day ii. Ibuprofen (Advil, etc) Three 200mg  tabs four times a day (every meal & bedtime) d. A  prescription for pain medication (such as percocet, oxycodone, hydrocodone, etc)  should be given to you upon discharge.  Take your pain medication as prescribed.  i. If you are having problems/concerns with the prescription medicine (does not control pain, nausea, vomiting, rash, itching, etc), please call us [phone] to see if we need to switch you to a different pain medicine that will work better for you and/or control your side effect better. ii. If you need a refill on your  pain medication, please contact your pharmacy.  They will contact our office to request authorization. Prescriptions will not be filled after 5 pm or on week-ends. 4. KEEP YOUR BOWELS REGULAR and AVOID CONSTIPATION a. The goal is one to two soft bowel movements a day.  You should at least have a bowel movement every other day. b. Avoid getting constipated.  Between the surgery and the pain medications, it is common to experience some constipation. This can be very painful after rectal surgery.  Increasing fluid intake and taking a fiber supplement (such as Metamucil, Citrucel, FiberCon, etc) 1-2 times a day regularly will usually help prevent this problem from occurring.  A stool softener like colace is also recommended.  This can be purchased over the counter at your pharmacy.  You can take it up to 3 times a day.  If you do not have a bowel movement after 24 hrs since your surgery, take one does of milk of magnesia.  If you still haven't had a bowel movement 8-12 hours after that dose, take another dose.  If you don't have a bowel movement 48 hrs after surgery, purchase a Fleets enema from the drug store and administer gently per package instructions.  If you still are having trouble with your bowel movements after that, please call the office for further instructions. c. If you develop diarrhea or have many loose bowel movements, simplify your diet to bland foods & liquids for a few days.  Stop any stool softeners and decrease your fiber supplement.  Switching to mild anti-diarrheal medications (Kayopectate, Pepto Bismol) can help.  If this worsens or does not improve, please call us.  5. Wound Care a. Remove your bandages before your first bowel movement or 8 hours after surgery.     b. Remove any wound packing material at this tim,e as well.  You do not need to repack the wound unless instructed otherwise.  Wear an absorbent pad or soft cotton gauze in your underwear to catch any drainage and help keep  the area clean. You should change this every 2-3 hours while awake. c. Keep the area clean and dry.  Bathe / shower every day, especially after bowel movements.  Keep the area clean by showering / bathing over the incision / wound.   It is okay to soak an open wound to help wash it.  Wet wipes or showers / gentle washing after bowel movements is often less traumatic than regular toilet paper. d. Dennis Bast may have some styrofoam-like soft packing in the rectum which will come out with the first bowel movement.  e. You will often notice bleeding with bowel movements.  This should slow down by the end of the first week of surgery f. Expect some drainage.  This should slow down, too, by the end of the first week of surgery.  Wear an absorbent pad or soft cotton gauze in your underwear until the drainage stops. g. Do Not sit on a rubber or pillow ring.  This can make you symptoms worse.  You may sit on a  soft pillow if needed.  6. ACTIVITIES as tolerated:   a. You may resume regular (light) daily activities beginning the next day--such as daily self-care, walking, climbing stairs--gradually increasing activities as tolerated.  If you can walk 30 minutes without difficulty, it is safe to try more intense activity such as jogging, treadmill, bicycling, low-impact aerobics, swimming, etc. b. Save the most intensive and strenuous activity for last such as sit-ups, heavy lifting, contact sports, etc  Refrain from any heavy lifting or straining until you are off narcotics for pain control.   c. You may drive when you are no longer taking prescription pain medication, you can comfortably sit for long periods of time, and you can safely maneuver your car and apply brakes. d. Dennis Bast may have sexual intercourse when it is comfortable.  7. FOLLOW UP in our office a. Please call [facility] at [phone] to set up an appointment to see your surgeon in the office for a follow-up appointment approximately 3-4 weeks after your  surgery. b. Make sure that you call for this appointment the day you arrive home to insure a convenient appointment time. 10. IF YOU HAVE DISABILITY OR FAMILY LEAVE FORMS, BRING THEM TO THE OFFICE FOR PROCESSING.  DO NOT GIVE THEM TO YOUR DOCTOR.     WHEN TO CALL us [phone]: 1. Poor pain control 2. Reactions / problems with new medications (rash/itching, nausea, etc)  3. Fever over 101.5 F (38.5 C) 4. Inability to urinate 5. Nausea and/or vomiting 6. Worsening swelling or bruising 7. Continued bleeding from incision. 8. Increased pain, redness, or drainage from the incision  The clinic staff is available to answer your questions during regular business hours (8:30am-5pm).  Please don't hesitate to call and ask to speak to one of our nurses for clinical concerns.   A surgeon from [facility] is always on call at the hospitals   If you have a medical emergency, go to the nearest emergency room or call 911.    [facility], [address], [place], [place], [place]  [address] ? MAIN: [phone] ? TOLL FREE: [phone] ? FAX [phone] [email or web address]  Do not take any tylenol until after 5:00 pm today.

## 2020-05-29 NOTE — Op Note (Signed)
05/29/2020  2:36 PM  PATIENT:  Bonnie Lewis  34 y.o. female  Patient Care Team: Mosie Lukes, MD as PCP - General  PRE-OPERATIVE DIAGNOSIS:  ANAL FISTULA  POST-OPERATIVE DIAGNOSIS:  ANAL FISTULA  PROCEDURE:  ANAL EXAM UNDER ANESTHESIA, ANAL FISTULOTOMY    Surgeon(s): Leighton Ruff, MD  ASSISTANT: none   ANESTHESIA:   local and MAC  SPECIMEN:  No Specimen  DISPOSITION OF SPECIMEN:  N/A  COUNTS:  YES  PLAN OF CARE: Discharge to home after PACU  PATIENT DISPOSITION:  PACU - hemodynamically stable.  INDICATION: anal fistula   OR FINDINGS: L anterior trans-sphincteric fistula involving superficial external sphincter and ~5% internal sphincter.  DESCRIPTION: the patient was identified in the preoperative holding area and taken to the OR where they were laid on the operating room table.  MAC anesthesia was induced without difficulty. The patient was then positioned in prone jackknife position with buttocks gently taped apart.  The patient was then prepped and draped in usual sterile fashion.  SCDs were noted to be in place prior to the initiation of anesthesia. A surgical timeout was performed indicating the correct patient, procedure, positioning and need for preoperative antibiotics.  A rectal block was performed using Marcaine with epinephrine.    I began with a digital rectal exam.   Sphincter hypertension was noted.  There was a shallow anterior midline fissure noted.  I dilated to approximately 2 fingerbreadths.  I then placed a Hill-Ferguson anoscope into the anal canal and evaluated this completely.  There was no other pathology noted.  The internal opening could be seen in the distal anal canal.  I placed an S-shaped fistula probe into the left anterior external opening and worked this to the internal opening.  There was approximately 5% of internal sphincter involvement.  I decided to perform a fistulotomy.  This was done using electrocautery.  The superficial external  sphincter was transected and tacked to the fistulotomy tract using interrupted 3-0 Vicryl sutures.  The edges of the fistula were then marsupialized using a running 3-0 chromic suture.  Hemostasis was good.  Lidocaine ointment and sterile dressing was applied.  The patient was awakened from anesthesia and sent to the postanesthesia care unit in stable condition.  All counts were correct per operating room staff.

## 2020-05-29 NOTE — H&P (Addendum)
The patient is a 34 year old female who presents with a perirectal abscess. She presented to urgent office mid-May for perianal pain. She states she initially felt something abnormal in the area a few weeks to months prior, but it quickly became larger, more swollen, and painful. She states she may have had an anal fissure years ago, but denies any other issues in this area. She underwent bedside I&D on 02/25/20 in urgent office, but a large abscess cavity was not found. There was a small amount of purulent drainage. She followed up the next week and was doing better, but continued to have drainage and occasional pain. She was prescribed her Cipro/Flagyl on 03/08/20, but she only took Cipro because she was not aware that Flagyl was also prescribed. She states she sees some bloody drainage on occasion. Past Medical History:  Diagnosis Date  . ADD (attention deficit disorder)   . Anal fistula   . Dyslipidemia   . Migraine   . Seasonal affective disorder ([place])   . Wears contact lenses    Past Surgical History:  Procedure Laterality Date  . NO PAST SURGERIES     Family History  Problem Relation Age of Onset  . Asthma Father   . Depression Father        and anxiety  . Allergies Father   . Irritable bowel syndrome Father   . Dementia Father   . Depression Sister   . Irritable bowel syndrome Sister   . Migraines Sister   . Depression Brother   . Allergies Brother   . Depression Maternal Grandmother        Anxiety  . Hypertension Maternal Grandmother   . ADD / ADHD Maternal Grandmother   . Menstrual problems Maternal Grandmother        heavy bleeding requiring hysterectomy  . Depression Paternal Grandmother   . Cardiomyopathy Paternal Grandmother   . Menstrual problems Paternal Grandmother   . Hyperlipidemia Mother   . Anemia Sister   . Coronary artery disease Paternal Grandfather   . Cancer Paternal Aunt   . Brain cancer Maternal Aunt    Social History   Socioeconomic  History  . Marital status: Single    Spouse name: Not on file  . Number of children: Not on file  . Years of education: Not on file  . Highest education level: Not on file  Occupational History  . Not on file  Tobacco Use  . Smoking status: Never Smoker  . Smokeless tobacco: Never Used  Vaping Use  . Vaping Use: Never used  Substance and Sexual Activity  . Alcohol use: Yes    Comment: occasional  . Drug use: Never  . Sexual activity: Yes    Birth control/protection: Pill  Other Topics Concern  . Not on file  Social History Narrative  . Not on file   Social Determinants of Health   Financial Resource Strain:   . Difficulty of Paying Living Expenses:   Food Insecurity:   . Worried About Charity fundraiser in the Last Year:   . Arboriculturist in the Last Year:   Transportation Needs:   . Film/video editor (Medical):   Marland Kitchen Lack of Transportation (Non-Medical):   Physical Activity:   . Days of Exercise per Week:   . Minutes of Exercise per Session:   Stress:   . Feeling of Stress :   Social Connections:   . Frequency of Communication with Friends and Family:   .  Frequency of Social Gatherings with Friends and Family:   . Attends Religious Services:   . Active Member of Clubs or Organizations:   . Attends Archivist Meetings:   Marland Kitchen Marital Status:   Intimate Partner Violence:   . Fear of Current or Ex-Partner:   . Emotionally Abused:   Marland Kitchen Physically Abused:   . Sexually Abused:    Review of Systems - Negative except rectal pain   Allergies  Sulfa Drugs   Medication History  Rizatriptan Benzoate (10MG  Tablet, Oral) Active. valACYclovir HCl (1GM Tablet, Oral) Active. tiZANidine HCl (2MG  Tablet, Oral) Active. Vyvanse (70MG  Capsule, Oral) Active. Citalopram Hydrobromide (20MG  Tablet, Oral) Active. Kariva (0.15-0.02/0.01MG  (21/5) Tablet, Oral) Active. Ciprofloxacin HCl (500MG  Tablet, Oral) Active.  BP (!) 141/97   Pulse 71   Temp 99.4 F  (37.4 C) (Oral)   Resp 18   Ht 5' 4.5" (1.638 m)   Wt 103.2 kg   LMP 04/27/2020 (Approximate)   SpO2 100%   BMI 38.45 kg/m        Physical Exam General Mental Status-Alert. General Appearance-Cooperative. CV: RRR Lungs: clear Abd: soft Rectal Anorectal Exam External - Note: Left anterior external opening consistent with anal fistula with serosanguineous and purulent drainage. Fistula probe and starts approximately 2 cm.    Assessment & Plan ANAL FISTULA (K60.3) Story: We discussed today treatment of your anal fistula. We discussed that sometimes these heal on their own, but it is unlikely. Surgery consisted of evaluating the fistula under anesthesia, and if it is sufficiently shallow, we would divide the fistula and turned the tunnel into a ditch. This would take several weeks to heal up. The surgery has a success rate of approximately 98%. If the tunnel was not shallow, we would place a rubber band through the fistula tract which would stay in place for approximately 3 months. We will then proceed with a second surgery to divide the fistula. This surgery has a success rate of approximately 80%. Impression: 34 year old female diagnosed with a left anterior anal fistula, status post abscess drainage approximately 1 month ago. We discussed fistulotomy versus seton placement. We discussed that we tended take a conservative approach in people of childbearing years. We discussed the need for additional surgery, with seton placement and the risk of incontinence with fistulotomy. All questions were answered. The patient is unsure if she would like to proceed with surgery at this time. She will call the office if she develops any further questions or decides to go ahead with surgery. Current Plans

## 2020-05-29 NOTE — Transfer of Care (Signed)
Immediate Anesthesia Transfer of Care Note  Patient: Bonnie Lewis  Procedure(s) Performed: ANAL EXAM UNDER ANESTHESIA, ANAL FISTULOTOMY (N/A Anus)  Patient Location: PACU  Anesthesia Type:MAC  Level of Consciousness: awake, alert  and oriented  Airway & Oxygen Therapy: Patient Spontanous Breathing  Post-op Assessment: Report given to RN and Post -op Vital signs reviewed and stable  Post vital signs: Reviewed and stable  Last Vitals:  Vitals Value Taken Time  BP 133/79 05/29/20 1438  Temp    Pulse 85 05/29/20 1441  Resp 19 05/29/20 1441  SpO2 100 % 05/29/20 1441  Vitals shown include unvalidated device data.  Last Pain:  Vitals:   05/29/20 1124  TempSrc: Oral         Complications: No complications documented.

## 2020-05-29 NOTE — Anesthesia Postprocedure Evaluation (Signed)
Anesthesia Post Note  Patient: Bonnie Lewis  Procedure(s) Performed: ANAL EXAM UNDER ANESTHESIA, ANAL FISTULOTOMY (N/A Anus)     Patient location during evaluation: PACU Anesthesia Type: MAC Level of consciousness: awake and alert Pain management: pain level controlled Vital Signs Assessment: post-procedure vital signs reviewed and stable Respiratory status: spontaneous breathing, nonlabored ventilation, respiratory function stable and patient connected to nasal cannula oxygen Cardiovascular status: stable and blood pressure returned to baseline Postop Assessment: no apparent nausea or vomiting Anesthetic complications: no   No complications documented.  Last Vitals:  Vitals:   05/29/20 1500 05/29/20 1511  BP: 124/77 123/78  Pulse: 62 62  Resp: 15 13  Temp:    SpO2: 100% 100%    Last Pain:  Vitals:   05/29/20 1500  TempSrc:   PainSc: 0-No pain                 Merlinda Frederick

## 2020-05-29 NOTE — Anesthesia Preprocedure Evaluation (Addendum)
Anesthesia Evaluation  Patient identified by MRN, date of birth, ID band Patient awake    Reviewed: Allergy & Precautions, NPO status , Patient's Chart, lab work & pertinent test results  History of Anesthesia Complications Negative for: history of anesthetic complications  Airway Mallampati: II  TM Distance: >3 FB Neck ROM: Full    Dental  (+) Dental Advisory Given, Teeth Intact   Pulmonary  05/25/2020 SARS coronavirus NEG   breath sounds clear to auscultation       Cardiovascular (-) hypertension Rhythm:Regular Rate:Normal     Neuro/Psych  Headaches, PSYCHIATRIC DISORDERS (ADD) Depression    GI/Hepatic negative GI ROS, Neg liver ROS,   Endo/Other  Morbid obesity  Renal/GU negative Renal ROS     Musculoskeletal   Abdominal (+) + obese,   Peds  Hematology negative hematology ROS (+)   Anesthesia Other Findings   Reproductive/Obstetrics On OCPs                           Anesthesia Physical Anesthesia Plan  ASA: II  Anesthesia Plan: MAC   Post-op Pain Management:    Induction:   PONV Risk Score and Plan: 2 and Ondansetron and Dexamethasone  Airway Management Planned: Natural Airway and Simple Face Mask  Additional Equipment: None  Intra-op Plan:   Post-operative Plan:   Informed Consent: I have reviewed the patients History and Physical, chart, labs and discussed the procedure including the risks, benefits and alternatives for the proposed anesthesia with the patient or authorized representative who has indicated his/her understanding and acceptance.     Dental advisory given  Plan Discussed with: CRNA and Surgeon  Anesthesia Plan Comments:        Anesthesia Quick Evaluation

## 2020-05-31 ENCOUNTER — Encounter (HOSPITAL_BASED_OUTPATIENT_CLINIC_OR_DEPARTMENT_OTHER): Payer: Self-pay | Admitting: General Surgery

## 2020-06-26 ENCOUNTER — Other Ambulatory Visit: Payer: Self-pay | Admitting: Family Medicine

## 2020-06-26 ENCOUNTER — Encounter: Payer: Self-pay | Admitting: Family Medicine

## 2020-06-26 MED ORDER — CITALOPRAM HYDROBROMIDE 20 MG PO TABS: 40.0000 mg | ORAL_TABLET | Freq: Every day | ORAL | 1 refills | Status: DC

## 2020-07-10 ENCOUNTER — Other Ambulatory Visit: Payer: Self-pay | Admitting: Family Medicine

## 2020-08-13 ENCOUNTER — Other Ambulatory Visit: Payer: Self-pay | Admitting: Family Medicine

## 2020-08-28 ENCOUNTER — Encounter: Payer: Self-pay | Admitting: Family Medicine

## 2020-08-28 ENCOUNTER — Other Ambulatory Visit: Payer: Self-pay | Admitting: Family Medicine

## 2020-08-28 DIAGNOSIS — F988 Other specified behavioral and emotional disorders with onset usually occurring in childhood and adolescence: Secondary | ICD-10-CM

## 2020-08-28 MED ORDER — LISDEXAMFETAMINE DIMESYLATE 70 MG PO CAPS: 70.0000 mg | ORAL_CAPSULE | Freq: Every day | ORAL | 0 refills | Status: DC

## 2020-08-28 MED ORDER — VALACYCLOVIR HCL 1 G PO TABS: 2000.0000 mg | ORAL_TABLET | Freq: Two times a day (BID) | ORAL | 2 refills | Status: DC | PRN

## 2020-08-28 NOTE — Telephone Encounter (Signed)
Requesting: Vyvanse 70mg  Contract: 11/04/2019 UDS: 11/04/2019 Last Visit: 03/28/2020 Next Visit: 10/16/2020 Last Refill: 05/17/2020 #30 and 0RF   Please Advise

## 2020-10-13 ENCOUNTER — Encounter: Payer: Self-pay | Admitting: Family Medicine

## 2020-10-15 ENCOUNTER — Encounter: Payer: Self-pay | Admitting: Family Medicine

## 2020-10-16 ENCOUNTER — Encounter: Payer: Self-pay | Admitting: Family Medicine

## 2020-10-16 ENCOUNTER — Telehealth (INDEPENDENT_AMBULATORY_CARE_PROVIDER_SITE_OTHER): Payer: 59 | Admitting: Family Medicine

## 2020-10-16 ENCOUNTER — Other Ambulatory Visit: Payer: Self-pay

## 2020-10-16 DIAGNOSIS — B349 Viral infection, unspecified: Secondary | ICD-10-CM | POA: Diagnosis not present

## 2020-10-16 DIAGNOSIS — F988 Other specified behavioral and emotional disorders with onset usually occurring in childhood and adolescence: Secondary | ICD-10-CM

## 2020-10-16 MED ORDER — LISDEXAMFETAMINE DIMESYLATE 70 MG PO CAPS: 70.0000 mg | ORAL_CAPSULE | Freq: Every day | ORAL | 0 refills | Status: DC

## 2020-10-16 NOTE — Telephone Encounter (Signed)
Refills sent during virtual visit today.

## 2020-10-16 NOTE — Progress Notes (Signed)
Virtual Visit via Video Note  I connected with Rane Vaugh on 10/16/20 at 10:00 AM EST by a video enabled telemedicine application and verified that I am speaking with the correct person using two identifiers.  Location Patient: home, patient and provider in visit Provider: office   I discussed the limitations of evaluation and management by telemedicine and the availability of in person appointments. The patient expressed understanding and agreed to proceed. Kyung Rudd, CMA was able to get the patient setup on a video visit    Subjective:    Patient ID: Itati Pagnozzi, female    DOB: Jun 24, 1986, 35 y.o.   MRN: [id number]  Chief Complaint  Patient presents with  . Cough    Pt was exposed to co-worker that tested positive to COVID-19 on Saturday. Pt started having low grade fever, body aches, chills, diarrhea and sore throat. Pt unable to be tested or schedule an appt for testing as "all avenues have been booked". Pt will need note for work.  . ADHD    Pt is out of vyvanse.    HPI Patient is in today for evaluation of worsening respiratory symptoms. She had been struggling with low grade congestion/allergy type symptoms for a week or more and then over the past few days her coworker tested positive for COVID and the patient developed F/C/malaise/myalgias/sore throat/fatigue/headache/head congestion. Denies CP/palp/SOB/GI or GU c/o. Taking meds as prescribed  Past Medical History:  Diagnosis Date  . ADD (attention deficit disorder)   . Anal fistula   . Dyslipidemia   . Migraine   . Seasonal affective disorder ([place])   . Wears contact lenses     Past Surgical History:  Procedure Laterality Date  . ANAL FISTULOTOMY N/A 05/29/2020   Procedure: ANAL EXAM UNDER ANESTHESIA, ANAL FISTULOTOMY;  Surgeon: Leighton Ruff, MD;  Location: [place];  Service: General;  Laterality: N/A;  . NO PAST SURGERIES      Family History  Problem Relation Age of Onset  . Asthma  Father   . Depression Father        and anxiety  . Allergies Father   . Irritable bowel syndrome Father   . Dementia Father   . Depression Sister   . Irritable bowel syndrome Sister   . Migraines Sister   . Depression Brother   . Allergies Brother   . Depression Maternal Grandmother        Anxiety  . Hypertension Maternal Grandmother   . ADD / ADHD Maternal Grandmother   . Menstrual problems Maternal Grandmother        heavy bleeding requiring hysterectomy  . Depression Paternal Grandmother   . Cardiomyopathy Paternal Grandmother   . Menstrual problems Paternal Grandmother   . Hyperlipidemia Mother   . Anemia Sister   . Coronary artery disease Paternal Grandfather   . Cancer Paternal Aunt   . Brain cancer Maternal Aunt     Social History   Socioeconomic History  . Marital status: Single    Spouse name: Not on file  . Number of children: Not on file  . Years of education: Not on file  . Highest education level: Not on file  Occupational History  . Not on file  Tobacco Use  . Smoking status: Never Smoker  . Smokeless tobacco: Never Used  Vaping Use  . Vaping Use: Never used  Substance and Sexual Activity  . Alcohol use: Yes    Comment: occasional  . Drug use: Never  . Sexual activity:  Yes    Birth control/protection: Pill  Other Topics Concern  . Not on file  Social History Narrative  . Not on file   Social Determinants of Health   Financial Resource Strain: Not on file  Food Insecurity: Not on file  Transportation Needs: Not on file  Physical Activity: Not on file  Stress: Not on file  Social Connections: Not on file  Intimate Partner Violence: Not on file    Outpatient Medications Prior to Visit  Medication Sig Dispense Refill  . citalopram (CELEXA) 20 MG tablet Take 2 tablets (40 mg total) by mouth daily. 180 tablet 1  . KARIVA 0.15-0.02/0.01 MG (21/5) tablet TAKE 1 TABLET BY MOUTH EVERY DAY 84 tablet 0  . rizatriptan (MAXALT) 10 MG tablet TAKE 1  TABLET (10 MG TOTAL) BY MOUTH AS NEEDED FOR MIGRAINE. MAY REPEAT IN 2 HOURS IF NEEDED (Patient taking differently: Take 10 mg by mouth as needed. TAKE 1 TABLET (10 MG TOTAL) BY MOUTH AS NEEDED FOR MIGRAINE. MAY REPEAT IN 2 HOURS IF NEEDED) 10 tablet 3  . tiZANidine (ZANAFLEX) 2 MG tablet TAKE 1/2 TO 2 TABLET BY MOUTH TWICE A DAY AS NEEDED FOR MUSCLE SPASM 90 tablet 1  . valACYclovir (VALTREX) 1000 MG tablet Take 2 tablets (2,000 mg total) by mouth 2 (two) times daily as needed. 4 tablet 2  . lisdexamfetamine (VYVANSE) 70 MG capsule Take 1 capsule (70 mg total) by mouth daily. November 2021 30 capsule 0  . fluconazole (DIFLUCAN) 150 MG tablet Take 1 tablet (150 mg total) by mouth once a week. 2 tablet 1  . HYDROcodone-acetaminophen (NORCO/VICODIN) 5-325 MG tablet Take 1-2 tablets by mouth every 6 (six) hours as needed. 30 tablet 0  . mupirocin ointment (BACTROBAN) 2 % Place 1 application into the nose 2 (two) times daily as needed. 22 g 0   No facility-administered medications prior to visit.    Allergies  Allergen Reactions  . Amoxicillin-Pot Clavulanate Rash    Mild facial rash  . Sulfa Antibiotics Rash    Review of Systems  Constitutional: Positive for chills, fever and malaise/fatigue.  HENT: Positive for congestion.   Eyes: Negative for blurred vision.  Respiratory: Negative for cough and shortness of breath.   Cardiovascular: Negative for chest pain, palpitations and leg swelling.  Gastrointestinal: Negative for abdominal pain, blood in stool and nausea.  Genitourinary: Negative for dysuria and frequency.  Musculoskeletal: Positive for myalgias. Negative for falls.  Skin: Negative for rash.  Neurological: Positive for headaches. Negative for dizziness and loss of consciousness.  Endo/Heme/Allergies: Negative for environmental allergies.  Psychiatric/Behavioral: Negative for depression. The patient is not nervous/anxious.        Objective:    Physical Exam Constitutional:       Appearance: Normal appearance. She is not ill-appearing.  HENT:     Head: Normocephalic and atraumatic.     Nose: Nose normal.  Eyes:     General:        Right eye: No discharge.        Left eye: No discharge.  Pulmonary:     Effort: Pulmonary effort is normal.  Neurological:     Mental Status: She is alert and oriented to person, place, and time.  Psychiatric:        Behavior: Behavior normal.     BP (!) 137/96 Comment: pt reported Wt Readings from Last 3 Encounters:  05/29/20 227 lb 8 oz (103.2 kg)  03/28/20 226 lb 9.6 oz (102.8 kg)  02/22/20 223 lb 3.2 oz (101.2 kg)    Diabetic Foot Exam - Simple   No data filed    Lab Results  Component Value Date   WBC 6.9 04/10/2017   HGB 12.5 04/10/2017   HCT 37.2 04/10/2017   PLT 327.0 04/10/2017   GLUCOSE 99 04/10/2017   CHOL 171 04/10/2017   TRIG 90.0 04/10/2017   HDL 58.60 04/10/2017   LDLDIRECT 110.1 10/19/2012   LDLCALC 94 04/10/2017   ALT 36 (H) 04/10/2017   AST 28 04/10/2017   NA 137 04/10/2017   K 4.4 04/10/2017   CL 105 04/10/2017   CREATININE 0.86 04/10/2017   BUN 15 04/10/2017   CO2 23 04/10/2017   TSH 2.61 04/10/2017    Lab Results  Component Value Date   TSH 2.61 04/10/2017   Lab Results  Component Value Date   WBC 6.9 04/10/2017   HGB 12.5 04/10/2017   HCT 37.2 04/10/2017   MCV 88.5 04/10/2017   PLT 327.0 04/10/2017   Lab Results  Component Value Date   NA 137 04/10/2017   K 4.4 04/10/2017   CO2 23 04/10/2017   GLUCOSE 99 04/10/2017   BUN 15 04/10/2017   CREATININE 0.86 04/10/2017   BILITOT 0.3 04/10/2017   ALKPHOS 53 04/10/2017   AST 28 04/10/2017   ALT 36 (H) 04/10/2017   PROT 6.9 04/10/2017   ALBUMIN 4.3 04/10/2017   CALCIUM 9.2 04/10/2017   GFR 82.42 04/10/2017   Lab Results  Component Value Date   CHOL 171 04/10/2017   Lab Results  Component Value Date   HDL 58.60 04/10/2017   Lab Results  Component Value Date   LDLCALC 94 04/10/2017   Lab Results  Component  Value Date   TRIG 90.0 04/10/2017   Lab Results  Component Value Date   CHOLHDL 3 04/10/2017   No results found for: HGBA1C     Assessment & Plan:   Problem List Items Addressed This Visit    ADD (attention deficit disorder)    Doing well on current dose of Vyvanse given refills today      Relevant Medications   lisdexamfetamine (VYVANSE) 70 MG capsule   Viral illness    Patient had a close contact/coworker test positive and now she is symptomatic with congestion, headahce, malaise, sore throat. She has been calling around trying to find a place to get tested and has had no luck. She is taken our of work to quarantine this week until she can get tested and given [facility] testing number to call and arrange testing Pulse oximeter, want oxygen in 90s  Weekly vitals  Take Multivitamin with minerals, selenium Vitamin D 1000-2000 IU daily Probiotic with lactobacillus and bifidophilus Asprin EC 81 mg daily Fish oil caps daily Melatonin 2-5 mg at bedtime           I have discontinued Terrilee Medlen's fluconazole, mupirocin ointment, and HYDROcodone-acetaminophen. I have also changed her lisdexamfetamine. Additionally, I am having her start on lisdexamfetamine and lisdexamfetamine. Lastly, I am having her maintain her tiZANidine, rizatriptan, citalopram, Kariva, and valACYclovir.  Meds ordered this encounter  Medications  . lisdexamfetamine (VYVANSE) 70 MG capsule    Sig: Take 1 capsule (70 mg total) by mouth daily. March 2022    Dispense:  30 capsule    Refill:  0  . lisdexamfetamine (VYVANSE) 70 MG capsule    Sig: Take 1 capsule (70 mg total) by mouth daily. February 2022    Dispense:  30 capsule  Refill:  0  . lisdexamfetamine (VYVANSE) 70 MG capsule    Sig: Take 1 capsule (70 mg total) by mouth daily. January 2022    Dispense:  30 capsule    Refill:  0    I discussed the assessment and treatment plan with the patient. The patient was provided an opportunity to ask  questions and all were answered. The patient agreed with the plan and demonstrated an understanding of the instructions.   The patient was advised to call back or seek an in-person evaluation if the symptoms worsen or if the condition fails to improve as anticipated.  I provided 25 minutes of non-face-to-face time during this encounter.   Penni Homans, MD

## 2020-10-16 NOTE — Telephone Encounter (Signed)
Appt had virtual visit today w/ PCP.

## 2020-10-16 NOTE — Assessment & Plan Note (Signed)
Doing well on current dose of Vyvanse given refills today

## 2020-10-16 NOTE — Assessment & Plan Note (Addendum)
Patient had a close contact/coworker test positive and now she is symptomatic with congestion, headahce, malaise, sore throat. She has been calling around trying to find a place to get tested and has had no luck. She is taken our of work to quarantine this week until she can get tested and given [facility] testing number to call and arrange testing Pulse oximeter, want oxygen in 90s  Weekly vitals  Take Multivitamin with minerals, selenium Vitamin D 1000-2000 IU daily Probiotic with lactobacillus and bifidophilus Asprin EC 81 mg daily Fish oil caps daily Melatonin 2-5 mg at bedtime

## 2020-10-18 ENCOUNTER — Other Ambulatory Visit: Payer: 59

## 2020-10-18 ENCOUNTER — Other Ambulatory Visit: Payer: Self-pay

## 2020-10-18 DIAGNOSIS — Z20822 Contact with and (suspected) exposure to covid-19: Secondary | ICD-10-CM

## 2020-10-20 LAB — NOVEL CORONAVIRUS, NAA: SARS-CoV-2, NAA: NOT DETECTED

## 2020-10-20 LAB — SARS-COV-2, NAA 2 DAY TAT

## 2020-11-01 ENCOUNTER — Other Ambulatory Visit: Payer: Self-pay | Admitting: Family Medicine

## 2020-12-17 ENCOUNTER — Encounter: Payer: Self-pay | Admitting: Family Medicine

## 2020-12-18 ENCOUNTER — Other Ambulatory Visit: Payer: Self-pay | Admitting: Family Medicine

## 2020-12-18 DIAGNOSIS — F988 Other specified behavioral and emotional disorders with onset usually occurring in childhood and adolescence: Secondary | ICD-10-CM

## 2020-12-18 MED ORDER — LISDEXAMFETAMINE DIMESYLATE 70 MG PO CAPS: 70.0000 mg | ORAL_CAPSULE | Freq: Every day | ORAL | 0 refills | Status: DC

## 2020-12-18 MED ORDER — TIZANIDINE HCL 2 MG PO TABS: ORAL_TABLET | ORAL | 1 refills | Status: DC

## 2020-12-18 MED ORDER — LISDEXAMFETAMINE DIMESYLATE 70 MG PO CAPS
70.0000 mg | ORAL_CAPSULE | Freq: Every day | ORAL | 0 refills | Status: DC
Start: 2020-12-18 — End: 2021-04-17

## 2021-01-02 ENCOUNTER — Other Ambulatory Visit: Payer: Self-pay | Admitting: Family Medicine

## 2021-01-11 ENCOUNTER — Other Ambulatory Visit: Payer: Self-pay | Admitting: Family Medicine

## 2021-02-05 ENCOUNTER — Other Ambulatory Visit: Payer: Self-pay | Admitting: Family Medicine

## 2021-03-23 ENCOUNTER — Other Ambulatory Visit: Payer: Self-pay | Admitting: Family Medicine

## 2021-04-17 ENCOUNTER — Encounter: Payer: Self-pay | Admitting: Family Medicine

## 2021-04-17 ENCOUNTER — Other Ambulatory Visit: Payer: Self-pay | Admitting: Family Medicine

## 2021-04-17 DIAGNOSIS — F988 Other specified behavioral and emotional disorders with onset usually occurring in childhood and adolescence: Secondary | ICD-10-CM

## 2021-04-17 MED ORDER — LISDEXAMFETAMINE DIMESYLATE 70 MG PO CAPS: 70.0000 mg | ORAL_CAPSULE | Freq: Every day | ORAL | 0 refills | Status: DC

## 2021-04-17 NOTE — Telephone Encounter (Signed)
Requesting: Vvyanse 70mg  Contract: 11/04/2019 UDS: 11/04/2019 Last Visit: 10/16/2020 Next Visit: None Last Refill: 12/18/2020 #30 and 0RF (X3)  Please Advise

## 2021-04-17 NOTE — Telephone Encounter (Signed)
Requesting: Vyvanse  (PATIENT WOULD LIKE TO GET ENOUGH TIL APPT 07/09/21) Contract: UDS: 11/04/19 Last Visit: 10/16/20 Next Visit: 07/09/21 Last Refill: 12/18/20  Please Advise

## 2021-06-13 ENCOUNTER — Other Ambulatory Visit: Payer: Self-pay | Admitting: Family Medicine

## 2021-06-13 DIAGNOSIS — F988 Other specified behavioral and emotional disorders with onset usually occurring in childhood and adolescence: Secondary | ICD-10-CM

## 2021-06-15 ENCOUNTER — Encounter: Payer: Self-pay | Admitting: Family Medicine

## 2021-06-15 MED ORDER — LISDEXAMFETAMINE DIMESYLATE 70 MG PO CAPS: 70.0000 mg | ORAL_CAPSULE | Freq: Every day | ORAL | 0 refills | Status: DC

## 2021-06-15 NOTE — Telephone Encounter (Signed)
Patient is requesting a refill of the following medications: Requested Prescriptions   Pending Prescriptions Disp Refills   lisdexamfetamine (VYVANSE) 70 MG capsule 30 capsule 0    Sig: Take 1 capsule (70 mg total) by mouth daily. August 2022    Date of patient request: 06/14/21 Last office visit: 10/16/20 Date of last refill: 04/17/21 Last refill amount: 30 Follow up time period per chart: 07/09/21  Contract: 11/04/2019 UDS: 11/04/2019

## 2021-06-24 ENCOUNTER — Other Ambulatory Visit: Payer: Self-pay | Admitting: Family Medicine

## 2021-06-26 NOTE — Telephone Encounter (Signed)
Pharmacy requesting 180 tabs.  The usual fill for this is 90 tabs.  Are you ok with filling for 180 tabs?

## 2021-07-09 ENCOUNTER — Ambulatory Visit (INDEPENDENT_AMBULATORY_CARE_PROVIDER_SITE_OTHER): Payer: 59 | Admitting: Family Medicine

## 2021-07-09 ENCOUNTER — Encounter: Payer: Self-pay | Admitting: Family Medicine

## 2021-07-09 ENCOUNTER — Other Ambulatory Visit: Payer: Self-pay

## 2021-07-09 VITALS — BP 118/84 | HR 85 | Temp 98.1°F | Resp 16 | Wt 241.6 lb

## 2021-07-09 DIAGNOSIS — D649 Anemia, unspecified: Secondary | ICD-10-CM

## 2021-07-09 DIAGNOSIS — M546 Pain in thoracic spine: Secondary | ICD-10-CM

## 2021-07-09 DIAGNOSIS — Z124 Encounter for screening for malignant neoplasm of cervix: Secondary | ICD-10-CM

## 2021-07-09 DIAGNOSIS — F988 Other specified behavioral and emotional disorders with onset usually occurring in childhood and adolescence: Secondary | ICD-10-CM

## 2021-07-09 DIAGNOSIS — Z79899 Other long term (current) drug therapy: Secondary | ICD-10-CM

## 2021-07-09 DIAGNOSIS — R5383 Other fatigue: Secondary | ICD-10-CM | POA: Insufficient documentation

## 2021-07-09 DIAGNOSIS — E669 Obesity, unspecified: Secondary | ICD-10-CM

## 2021-07-09 DIAGNOSIS — E782 Mixed hyperlipidemia: Secondary | ICD-10-CM

## 2021-07-09 MED ORDER — LISDEXAMFETAMINE DIMESYLATE 70 MG PO CAPS: 70.0000 mg | ORAL_CAPSULE | Freq: Every day | ORAL | 0 refills | Status: DC

## 2021-07-09 NOTE — Assessment & Plan Note (Signed)
Declines blood work today. No source of bleeding

## 2021-07-09 NOTE — Assessment & Plan Note (Signed)
Encourage heart healthy diet such as MIND or DASH diet, increase exercise, avoid trans fats, simple carbohydrates and processed foods, consider a krill or fish or flaxseed oil cap daily. She agrees to [employer] with next CPE

## 2021-07-09 NOTE — Progress Notes (Signed)
Subjective:   By signing my name below, I, Zite Okoli, attest that this documentation has been prepared under the direction and in the presence of Mosie Lukes, MD. 07/09/2021   Patient ID: Bonnie Lewis, female    DOB: 1985/11/09, 35 y.o.   MRN: [id number]  Chief Complaint  Patient presents with   Follow-up    HPI Patient is in today for an office visit. She is also here to renew her UDS/ contract for Vyvanse.  She reports she has had Covid-19 twice this year but does not have any residual symptoms.  She has been feeling very tired but thinks it is due to overworking.   She has been managing her ADD with 70 mg Vyvanse and mentions it is being effective and she is doing well on it.  She is requesting a refill on 70 mg Vyvanse.   She is not participating in regular exercise and she is not managing a healthy diet.  She is not interested in getting the flu vaccine today.  Past Medical History:  Diagnosis Date   ADD (attention deficit disorder)    Anal fistula    Dyslipidemia    Migraine    Seasonal affective disorder (HCC)    Wears contact lenses     Past Surgical History:  Procedure Laterality Date   ANAL FISTULOTOMY N/A 05/29/2020   Procedure: ANAL EXAM UNDER ANESTHESIA, ANAL FISTULOTOMY;  Surgeon: Leighton Ruff, MD;  Location: [facility];  Service: General;  Laterality: N/A;   NO PAST SURGERIES      Family History  Problem Relation Age of Onset   Asthma Father    Depression Father        and anxiety   Allergies Father    Irritable bowel syndrome Father    Dementia Father    Depression Sister    Irritable bowel syndrome Sister    Migraines Sister    Depression Brother    Allergies Brother    Depression Maternal Grandmother        Anxiety   Hypertension Maternal Grandmother    ADD / ADHD Maternal Grandmother    Menstrual problems Maternal Grandmother        heavy bleeding requiring hysterectomy   Depression Paternal Grandmother     Cardiomyopathy Paternal Grandmother    Menstrual problems Paternal Grandmother    Hyperlipidemia Mother    Anemia Sister    Coronary artery disease Paternal Grandfather    Cancer Paternal Aunt    Brain cancer Maternal Aunt     Social History   Socioeconomic History   Marital status: Single    Spouse name: Not on file   Number of children: Not on file   Years of education: Not on file   Highest education level: Not on file  Occupational History   Not on file  Tobacco Use   Smoking status: Never   Smokeless tobacco: Never  Vaping Use   Vaping Use: Never used  Substance and Sexual Activity   Alcohol use: Yes    Comment: occasional   Drug use: Never   Sexual activity: Yes    Birth control/protection: Pill  Other Topics Concern   Not on file  Social History Narrative   Not on file   Social Determinants of Health   Financial Resource Strain: Not on file  Food Insecurity: Not on file  Transportation Needs: Not on file  Physical Activity: Not on file  Stress: Not on file  Social Connections: Not  on file  Intimate Partner Violence: Not on file    Outpatient Medications Prior to Visit  Medication Sig Dispense Refill   citalopram (CELEXA) 20 MG tablet TAKE 2 TABLETS BY MOUTH EVERY DAY 180 tablet 1   desogestrel-ethinyl estradiol (KARIVA) 0.15-0.02/0.01 MG (21/5) tablet Take 1 tablet by mouth daily. 84 tablet 3   rizatriptan (MAXALT) 10 MG tablet TAKE 1 TABLET (10 MG TOTAL) BY MOUTH AS NEEDED FOR MIGRAINE. MAY REPEAT IN 2 HOURS IF NEEDED (Patient taking differently: Take 10 mg by mouth as needed. TAKE 1 TABLET (10 MG TOTAL) BY MOUTH AS NEEDED FOR MIGRAINE. MAY REPEAT IN 2 HOURS IF NEEDED) 10 tablet 3   tiZANidine (ZANAFLEX) 2 MG tablet TAKE 1/2 TO 1 TABLET BY MOUTH 2 TIMES A DAY AS NEEDED FOR MUSCLE SPASMS 180 tablet 1   valACYclovir (VALTREX) 1000 MG tablet Take 2 tablets (2,000 mg total) by mouth 2 (two) times daily as needed. 4 tablet 2   lisdexamfetamine (VYVANSE) 70 MG  capsule Take 1 capsule (70 mg total) by mouth daily. July 2022 30 capsule 0   lisdexamfetamine (VYVANSE) 70 MG capsule Take 1 capsule (70 mg total) by mouth daily. August 2022 30 capsule 0   No facility-administered medications prior to visit.    Allergies  Allergen Reactions   Amoxicillin-Pot Clavulanate Rash    Mild facial rash   Sulfa Antibiotics Rash    Review of Systems  Constitutional:  Positive for malaise/fatigue. Negative for fever.  HENT:  Negative for congestion.   Eyes:  Negative for redness.  Respiratory:  Negative for shortness of breath.   Cardiovascular:  Negative for chest pain, palpitations and leg swelling.  Gastrointestinal:  Negative for abdominal pain, blood in stool and nausea.  Genitourinary:  Negative for dysuria and frequency.  Musculoskeletal:  Negative for falls.  Skin:  Negative for rash.  Neurological:  Negative for dizziness, loss of consciousness and headaches.  Endo/Heme/Allergies:  Negative for polydipsia.  Psychiatric/Behavioral:  Negative for depression. The patient is not nervous/anxious.       Objective:    Physical Exam Constitutional:      General: She is not in acute distress.    Appearance: She is well-developed.  HENT:     Head: Normocephalic and atraumatic.  Eyes:     Conjunctiva/sclera: Conjunctivae normal.  Neck:     Thyroid: No thyromegaly.  Cardiovascular:     Rate and Rhythm: Normal rate and regular rhythm.     Heart sounds: Normal heart sounds. No murmur heard. Pulmonary:     Effort: Pulmonary effort is normal. No respiratory distress.     Breath sounds: Normal breath sounds.  Abdominal:     General: Bowel sounds are normal. There is no distension.     Palpations: Abdomen is soft. There is no mass.     Tenderness: There is no abdominal tenderness.  Musculoskeletal:     Cervical back: Neck supple.  Lymphadenopathy:     Cervical: No cervical adenopathy.  Skin:    General: Skin is warm and dry.  Neurological:      Mental Status: She is alert and oriented to person, place, and time.  Psychiatric:        Behavior: Behavior normal.    BP 118/84   Pulse 85   Temp 98.1 F (36.7 C)   Resp 16   Wt 241 lb 9.6 oz (109.6 kg)   SpO2 97%   BMI 40.83 kg/m  Wt Readings from Last 3 Encounters:  07/09/21 241 lb 9.6 oz (109.6 kg)  05/29/20 227 lb 8 oz (103.2 kg)  03/28/20 226 lb 9.6 oz (102.8 kg)    Diabetic Foot Exam - Simple   No data filed    Lab Results  Component Value Date   WBC 6.9 04/10/2017   HGB 12.5 04/10/2017   HCT 37.2 04/10/2017   PLT 327.0 04/10/2017   GLUCOSE 99 04/10/2017   CHOL 171 04/10/2017   TRIG 90.0 04/10/2017   HDL 58.60 04/10/2017   LDLDIRECT 110.1 10/19/2012   LDLCALC 94 04/10/2017   ALT 36 (H) 04/10/2017   AST 28 04/10/2017   NA 137 04/10/2017   K 4.4 04/10/2017   CL 105 04/10/2017   CREATININE 0.86 04/10/2017   BUN 15 04/10/2017   CO2 23 04/10/2017   TSH 2.61 04/10/2017    Lab Results  Component Value Date   TSH 2.61 04/10/2017   Lab Results  Component Value Date   WBC 6.9 04/10/2017   HGB 12.5 04/10/2017   HCT 37.2 04/10/2017   MCV 88.5 04/10/2017   PLT 327.0 04/10/2017   Lab Results  Component Value Date   NA 137 04/10/2017   K 4.4 04/10/2017   CO2 23 04/10/2017   GLUCOSE 99 04/10/2017   BUN 15 04/10/2017   CREATININE 0.86 04/10/2017   BILITOT 0.3 04/10/2017   ALKPHOS 53 04/10/2017   AST 28 04/10/2017   ALT 36 (H) 04/10/2017   PROT 6.9 04/10/2017   ALBUMIN 4.3 04/10/2017   CALCIUM 9.2 04/10/2017   GFR 82.42 04/10/2017   Lab Results  Component Value Date   CHOL 171 04/10/2017   Lab Results  Component Value Date   HDL 58.60 04/10/2017   Lab Results  Component Value Date   LDLCALC 94 04/10/2017   Lab Results  Component Value Date   TRIG 90.0 04/10/2017   Lab Results  Component Value Date   CHOLHDL 3 04/10/2017   No results found for: HGBA1C     Assessment & Plan:   Problem List Items Addressed This Visit     Mixed  hyperlipidemia    Encourage heart healthy diet such as MIND or DASH diet, increase exercise, avoid trans fats, simple carbohydrates and processed foods, consider a krill or fish or flaxseed oil cap daily. She agrees to [employer] with next CPE      Obesity    Encouraged DASH or MIND diet, decrease po intake and increase exercise as tolerated. Needs 7-8 hours of sleep nightly. Avoid trans fats, eat small, frequent meals every 4-5 hours with lean proteins, complex carbs and healthy fats. Minimize simple carbs, high fat foods and processed foods      Relevant Medications   lisdexamfetamine (VYVANSE) 70 MG capsule   lisdexamfetamine (VYVANSE) 70 MG capsule   Anemia    Declines blood work today. No source of bleeding      Cervical cancer screening    Agrees to pap at next visit in 6 months with CPE      ADD (attention deficit disorder)    Is stable on Vyvanse, no changes today.       Relevant Medications   lisdexamfetamine (VYVANSE) 70 MG capsule   Other Relevant Orders   Drug Monitoring Panel [phone] , Urine   Right-sided thoracic back pain    Her back and hip pain are improved after therapy      Fatigue    Has had a lot of changes with work [employer], recovering from her  fistula surgery, covid and aging. Has lost a couple distant relatives past away. Encouraged to stay active, hydrate and eat a heart healthy diet      Other Visit Diagnoses     High risk medication use    -  Primary   Relevant Orders   Drug Monitoring Panel [phone] , Urine        Meds ordered this encounter  Medications   lisdexamfetamine (VYVANSE) 70 MG capsule    Sig: Take 1 capsule (70 mg total) by mouth daily. November 2022    Dispense:  30 capsule    Refill:  0   lisdexamfetamine (VYVANSE) 70 MG capsule    Sig: Take 1 capsule (70 mg total) by mouth daily. October 2022    Dispense:  30 capsule    Refill:  0    I,Zite Okoli,acting as a Education administrator for Penni Homans, MD.,have documented all relevant  documentation on the behalf of Penni Homans, MD,as directed by  Penni Homans, MD while in the presence of Penni Homans, MD.   I, Mosie Lukes, MD., personally preformed the services described in this documentation.  All medical record entries made by the scribe were at my direction and in my presence.  I have reviewed the chart and discharge instructions (if applicable) and agree that the record reflects my personal performance and is accurate and complete. 07/09/2021

## 2021-07-09 NOTE — Assessment & Plan Note (Signed)
Is stable on Vyvanse, no changes today.

## 2021-07-09 NOTE — Assessment & Plan Note (Signed)
Her back and hip pain are improved after therapy

## 2021-07-09 NOTE — Assessment & Plan Note (Addendum)
Has had a lot of changes with work [employer], recovering from her fistula surgery, covid and aging. Has lost a couple distant relatives past away. Encouraged to stay active, hydrate and eat a heart healthy diet

## 2021-07-09 NOTE — Assessment & Plan Note (Signed)
Agrees to pap at next visit in 6 months with CPE

## 2021-07-09 NOTE — Patient Instructions (Addendum)
Consider flu and covid booster   Fatigue If you have fatigue, you feel tired all the time and have a lack of energy or a lack of motivation. Fatigue may make it difficult to start or complete tasks because of exhaustion. In general, occasional or mild fatigue is often a normal response to activity or life. However, long-lasting (chronic) or extreme fatigue may be a symptom of a medical condition. Follow these instructions at home: General instructions Watch your fatigue for any changes. Go to bed and get up at the same time every day. Avoid fatigue by pacing yourself during the day and getting enough sleep at night. Maintain a healthy weight. Medicines Take over-the-counter and prescription medicines only as told by your health care provider. Take a multivitamin, if told by your health care provider.  Do not use herbal or dietary supplements unless they are approved by your health care provider. Activity  Exercise regularly, as told by your health care provider. Use or practice techniques to help you relax, such as yoga, tai chi, meditation, or massage therapy. Eating and drinking  Avoid heavy meals in the evening. Eat a well-balanced diet, which includes lean proteins, whole grains, plenty of fruits and vegetables, and low-fat dairy products. Avoid consuming too much caffeine. Avoid the use of alcohol. Drink enough fluid to keep your urine pale yellow. Lifestyle Change situations that cause you stress. Try to keep your work and personal schedule in balance. Do not use any products that contain nicotine or tobacco, such as cigarettes and e-cigarettes. If you need help quitting, ask your health care provider. Do not use drugs. Contact a health care provider if: Your fatigue does not get better. You have a fever. You suddenly lose or gain weight. You have headaches. You have trouble falling asleep or sleeping through the night. You feel angry, guilty, anxious, or sad. You are  unable to have a bowel movement (constipation). Your skin is dry. You have swelling in your legs or another part of your body. Get help right away if: You feel confused. Your vision is blurry. You feel faint or you pass out. You have a severe headache. You have severe pain in your abdomen, your back, or the area between your waist and hips (pelvis). You have chest pain, shortness of breath, or an irregular or fast heartbeat. You are unable to urinate, or you urinate less than normal. You have abnormal bleeding, such as bleeding from the rectum, vagina, nose, lungs, or nipples. You vomit blood. You have thoughts about hurting yourself or others. If you ever feel like you may hurt yourself or others, or have thoughts about taking your own life, get help right away. You can go to your nearest emergency department or call: Your local emergency services (911 in the [place] A suicide crisis helpline, such as the [facility] at 725-096-4421. This is open 24 hours a day. Summary If you have fatigue, you feel tired all the time and have a lack of energy or a lack of motivation. Fatigue may make it difficult to start or complete tasks because of exhaustion. Long-lasting (chronic) or extreme fatigue may be a symptom of a medical condition. Exercise regularly, as told by your health care provider. Change situations that cause you stress. Try to keep your work and personal schedule in balance. This information is not intended to replace advice given to you by your health care provider. Make sure you discuss any questions you have with your health care provider.  Document Revised: 08/10/2020 Document Reviewed: 08/10/2020 Elsevier Patient Education  2022 [employer].

## 2021-07-09 NOTE — Assessment & Plan Note (Signed)
Encouraged DASH or MIND diet, decrease po intake and increase exercise as tolerated. Needs 7-8 hours of sleep nightly. Avoid trans fats, eat small, frequent meals every 4-5 hours with lean proteins, complex carbs and healthy fats. Minimize simple carbs, high fat foods and processed foods 

## 2021-07-10 ENCOUNTER — Telehealth: Payer: Self-pay | Admitting: *Deleted

## 2021-07-10 NOTE — Telephone Encounter (Signed)
UDS was not taken down to lab yesterday and was not able to be used.  Left message on machine to see if patient can call back to make lab only visit for UDS.  If she is unable to get by here soon, we can collect at next visit, but would like to re-collect sooner.

## 2021-07-10 NOTE — Addendum Note (Signed)
Addended by: Kem Boroughs D on: 07/10/2021 04:40 PM   Modules accepted: Orders

## 2021-07-12 ENCOUNTER — Other Ambulatory Visit: Payer: Self-pay | Admitting: Family Medicine

## 2021-08-10 ENCOUNTER — Other Ambulatory Visit: Payer: Self-pay | Admitting: Family Medicine

## 2021-09-15 ENCOUNTER — Encounter: Payer: Self-pay | Admitting: Family Medicine

## 2021-09-15 ENCOUNTER — Other Ambulatory Visit: Payer: Self-pay | Admitting: Family Medicine

## 2021-09-15 DIAGNOSIS — F988 Other specified behavioral and emotional disorders with onset usually occurring in childhood and adolescence: Secondary | ICD-10-CM

## 2021-09-17 ENCOUNTER — Other Ambulatory Visit: Payer: Self-pay | Admitting: Family Medicine

## 2021-09-17 ENCOUNTER — Encounter: Payer: Self-pay | Admitting: Family Medicine

## 2021-09-17 MED ORDER — LISDEXAMFETAMINE DIMESYLATE 70 MG PO CAPS: 70.0000 mg | ORAL_CAPSULE | Freq: Every day | ORAL | 0 refills | Status: DC

## 2021-09-17 NOTE — Telephone Encounter (Signed)
Requesting: Vyvanse 70mg   Contract: 07/09/2021 UDS: Ordered  on 07/10/2021- not done Last Visit: 07/09/2021 Next Visit: 01/14/2022 Last Refill: 07/09/2021 #30 and 0RF (X2)  Please Advise

## 2021-11-12 ENCOUNTER — Other Ambulatory Visit: Payer: Self-pay | Admitting: Family Medicine

## 2021-11-12 ENCOUNTER — Encounter: Payer: Self-pay | Admitting: Family Medicine

## 2021-11-12 DIAGNOSIS — F988 Other specified behavioral and emotional disorders with onset usually occurring in childhood and adolescence: Secondary | ICD-10-CM

## 2021-11-12 MED ORDER — LISDEXAMFETAMINE DIMESYLATE 70 MG PO CAPS: 70.0000 mg | ORAL_CAPSULE | Freq: Every day | ORAL | 0 refills | Status: DC

## 2021-11-12 NOTE — Telephone Encounter (Signed)
Requesting: Vyvanse 70mg   Contract: 07/09/2021 UDS: 11/04/2019 Last Visit: 07/09/2021 Next Visit: 01/14/2022 Last Refill: 09/17/21 #30 and 0RF (X2)  Please Advise

## 2021-12-13 ENCOUNTER — Other Ambulatory Visit: Payer: Self-pay | Admitting: Family Medicine

## 2021-12-13 MED ORDER — LISDEXAMFETAMINE DIMESYLATE 70 MG PO CAPS: 70.0000 mg | ORAL_CAPSULE | Freq: Every day | ORAL | 0 refills | Status: DC

## 2021-12-13 NOTE — Telephone Encounter (Signed)
Requesting: Vyvanse 70 MG  ?Contract: 07/09/21 ?UDS: 11/04/19 ?Last Visit:07/09/21 ?Next Visit:01/14/22 ?Last Refill: 11/12/21, #30, 0 refills ? ?Please Advise  ?

## 2022-01-10 ENCOUNTER — Other Ambulatory Visit: Payer: Self-pay | Admitting: Family Medicine

## 2022-01-12 ENCOUNTER — Encounter: Payer: Self-pay | Admitting: Family Medicine

## 2022-01-12 ENCOUNTER — Other Ambulatory Visit: Payer: Self-pay | Admitting: Family Medicine

## 2022-01-12 DIAGNOSIS — F988 Other specified behavioral and emotional disorders with onset usually occurring in childhood and adolescence: Secondary | ICD-10-CM

## 2022-01-14 ENCOUNTER — Encounter: Payer: 59 | Admitting: Family Medicine

## 2022-01-14 ENCOUNTER — Other Ambulatory Visit: Payer: Self-pay

## 2022-01-14 ENCOUNTER — Encounter: Payer: Self-pay | Admitting: Family Medicine

## 2022-01-14 MED ORDER — LISDEXAMFETAMINE DIMESYLATE 70 MG PO CAPS: 70.0000 mg | ORAL_CAPSULE | Freq: Every day | ORAL | 0 refills | Status: DC

## 2022-01-14 MED ORDER — CITALOPRAM HYDROBROMIDE 40 MG PO TABS: 40.0000 mg | ORAL_TABLET | Freq: Every day | ORAL | 1 refills | Status: DC

## 2022-01-14 NOTE — Telephone Encounter (Signed)
Requesting: vyvanse '70mg'$  ?Contract: 07/09/21 ?UDS: 11/04/19 ?Last Visit: 07/09/21 ? ?Next Visit: Originally scheduled for today 4/3  with Charlett Blake but appointment was canceled. Pt advised to make appointment with another provider. ? ?Last Refill: 11/12/21 #30, 0 refills ? ?Please Advise  ?

## 2022-02-06 NOTE — Progress Notes (Deleted)
? ?Subjective:  ? ? Patient ID: Bonnie Lewis, female    DOB: 18-Sep-1986, 36 y.o.   MRN: [id number] ? ?No chief complaint on file. ? ? ?HPI ?Patient is in today for her annual physical exam. ? ?Past Medical History:  ?Diagnosis Date  ? ADD (attention deficit disorder)   ? Anal fistula   ? Dyslipidemia   ? Migraine   ? Seasonal affective disorder ([place])   ? Wears contact lenses   ? ? ?Past Surgical History:  ?Procedure Laterality Date  ? ANAL FISTULOTOMY N/A 05/29/2020  ? Procedure: ANAL EXAM UNDER ANESTHESIA, ANAL FISTULOTOMY;  Surgeon: Leighton Ruff, MD;  Location: [place];  Service: General;  Laterality: N/A;  ? NO PAST SURGERIES    ? ? ?Family History  ?Problem Relation Age of Onset  ? Asthma Father   ? Depression Father   ?     and anxiety  ? Allergies Father   ? Irritable bowel syndrome Father   ? Dementia Father   ? Depression Sister   ? Irritable bowel syndrome Sister   ? Migraines Sister   ? Depression Brother   ? Allergies Brother   ? Depression Maternal Grandmother   ?     Anxiety  ? Hypertension Maternal Grandmother   ? ADD / ADHD Maternal Grandmother   ? Menstrual problems Maternal Grandmother   ?     heavy bleeding requiring hysterectomy  ? Depression Paternal Grandmother   ? Cardiomyopathy Paternal Grandmother   ? Menstrual problems Paternal Grandmother   ? Hyperlipidemia Mother   ? Anemia Sister   ? Coronary artery disease Paternal Grandfather   ? Cancer Paternal Aunt   ? Brain cancer Maternal Aunt   ? ? ?Social History  ? ?Socioeconomic History  ? Marital status: Single  ?  Spouse name: Not on file  ? Number of children: Not on file  ? Years of education: Not on file  ? Highest education level: Not on file  ?Occupational History  ? Not on file  ?Tobacco Use  ? Smoking status: Never  ? Smokeless tobacco: Never  ?Vaping Use  ? Vaping Use: Never used  ?Substance and Sexual Activity  ? Alcohol use: Yes  ?  Comment: occasional  ? Drug use: Never  ? Sexual activity: Yes  ?  Birth  control/protection: Pill  ?Other Topics Concern  ? Not on file  ?Social History Narrative  ? Not on file  ? ?Social Determinants of Health  ? ?Financial Resource Strain: Not on file  ?Food Insecurity: Not on file  ?Transportation Needs: Not on file  ?Physical Activity: Not on file  ?Stress: Not on file  ?Social Connections: Not on file  ?Intimate Partner Violence: Not on file  ? ? ?Outpatient Medications Prior to Visit  ?Medication Sig Dispense Refill  ? KARIVA 0.15-0.02/0.01 MG (21/5) tablet TAKE 1 TABLET BY MOUTH EVERY DAY 84 tablet 3  ? citalopram (CELEXA) 40 MG tablet Take 1 tablet (40 mg total) by mouth daily. 90 tablet 1  ? lisdexamfetamine (VYVANSE) 70 MG capsule Take 1 capsule (70 mg total) by mouth daily. May 2023 30 capsule 0  ? lisdexamfetamine (VYVANSE) 70 MG capsule Take 1 capsule (70 mg total) by mouth daily. 30 capsule 0  ? rizatriptan (MAXALT) 10 MG tablet TAKE 1 TABLET (10 MG TOTAL) BY MOUTH AS NEEDED FOR MIGRAINE. MAY REPEAT IN 2 HOURS IF NEEDED (Patient taking differently: Take 10 mg by mouth as needed. TAKE 1 TABLET (10  MG TOTAL) BY MOUTH AS NEEDED FOR MIGRAINE. MAY REPEAT IN 2 HOURS IF NEEDED) 10 tablet 3  ? tiZANidine (ZANAFLEX) 2 MG tablet TAKE 1/2 TO 1 TABLET BY MOUTH 2 TIMES A DAY AS NEEDED FOR MUSCLE SPASMS 180 tablet 1  ? valACYclovir (VALTREX) 1000 MG tablet Take 2 tablets (2,000 mg total) by mouth 2 (two) times daily as needed. 4 tablet 2  ? ?No facility-administered medications prior to visit.  ? ? ?Allergies  ?Allergen Reactions  ? Amoxicillin-Pot Clavulanate Rash  ?  Mild facial rash  ? Sulfa Antibiotics Rash  ? ? ?ROS ? ?   ?Objective:  ?  ?Physical Exam ? ?There were no vitals taken for this visit. ?Wt Readings from Last 3 Encounters:  ?07/09/21 241 lb 9.6 oz (109.6 kg)  ?05/29/20 227 lb 8 oz (103.2 kg)  ?03/28/20 226 lb 9.6 oz (102.8 kg)  ? ? ?Diabetic Foot Exam - Simple   ?No data filed ?  ? ?Lab Results  ?Component Value Date  ? WBC 6.9 04/10/2017  ? HGB 12.5 04/10/2017  ? HCT  37.2 04/10/2017  ? PLT 327.0 04/10/2017  ? GLUCOSE 99 04/10/2017  ? CHOL 171 04/10/2017  ? TRIG 90.0 04/10/2017  ? HDL 58.60 04/10/2017  ? LDLDIRECT 110.1 10/19/2012  ? [place] 94 04/10/2017  ? ALT 36 (H) 04/10/2017  ? AST 28 04/10/2017  ? NA 137 04/10/2017  ? K 4.4 04/10/2017  ? CL 105 04/10/2017  ? CREATININE 0.86 04/10/2017  ? BUN 15 04/10/2017  ? CO2 23 04/10/2017  ? TSH 2.61 04/10/2017  ? ? ?Lab Results  ?Component Value Date  ? TSH 2.61 04/10/2017  ? ?Lab Results  ?Component Value Date  ? WBC 6.9 04/10/2017  ? HGB 12.5 04/10/2017  ? HCT 37.2 04/10/2017  ? MCV 88.5 04/10/2017  ? PLT 327.0 04/10/2017  ? ?Lab Results  ?Component Value Date  ? NA 137 04/10/2017  ? K 4.4 04/10/2017  ? CO2 23 04/10/2017  ? GLUCOSE 99 04/10/2017  ? BUN 15 04/10/2017  ? CREATININE 0.86 04/10/2017  ? BILITOT 0.3 04/10/2017  ? ALKPHOS 53 04/10/2017  ? AST 28 04/10/2017  ? ALT 36 (H) 04/10/2017  ? PROT 6.9 04/10/2017  ? ALBUMIN 4.3 04/10/2017  ? CALCIUM 9.2 04/10/2017  ? GFR 82.42 04/10/2017  ? ?Lab Results  ?Component Value Date  ? CHOL 171 04/10/2017  ? ?Lab Results  ?Component Value Date  ? HDL 58.60 04/10/2017  ? ?Lab Results  ?Component Value Date  ? [place] 94 04/10/2017  ? ?Lab Results  ?Component Value Date  ? TRIG 90.0 04/10/2017  ? ?Lab Results  ?Component Value Date  ? CHOLHDL 3 04/10/2017  ? ?No results found for: HGBA1C ? ?   ?Assessment & Plan:  ? ?Problem List Items Addressed This Visit   ?None ? ? ?I am having Bonnie Lewis maintain her rizatriptan, valACYclovir, tiZANidine, Kariva, lisdexamfetamine, lisdexamfetamine, and citalopram. ? ?No orders of the defined types were placed in this encounter. ? ? ? ? ?

## 2022-02-07 ENCOUNTER — Encounter: Payer: Self-pay | Admitting: Family Medicine

## 2022-02-07 ENCOUNTER — Ambulatory Visit (INDEPENDENT_AMBULATORY_CARE_PROVIDER_SITE_OTHER): Payer: 59 | Admitting: Family Medicine

## 2022-02-07 VITALS — BP 115/82 | HR 74 | Resp 20 | Ht 64.5 in | Wt 235.6 lb

## 2022-02-07 DIAGNOSIS — D649 Anemia, unspecified: Secondary | ICD-10-CM | POA: Diagnosis not present

## 2022-02-07 DIAGNOSIS — E782 Mixed hyperlipidemia: Secondary | ICD-10-CM

## 2022-02-07 DIAGNOSIS — F988 Other specified behavioral and emotional disorders with onset usually occurring in childhood and adolescence: Secondary | ICD-10-CM | POA: Diagnosis not present

## 2022-02-07 DIAGNOSIS — Z0000 Encounter for general adult medical examination without abnormal findings: Secondary | ICD-10-CM

## 2022-02-07 DIAGNOSIS — Z79899 Other long term (current) drug therapy: Secondary | ICD-10-CM | POA: Diagnosis not present

## 2022-02-07 DIAGNOSIS — G43809 Other migraine, not intractable, without status migrainosus: Secondary | ICD-10-CM

## 2022-02-07 MED ORDER — RIZATRIPTAN BENZOATE 10 MG PO TABS: ORAL_TABLET | ORAL | 3 refills | Status: AC

## 2022-02-07 MED ORDER — ACYCLOVIR 400 MG PO TABS: 400.0000 mg | ORAL_TABLET | Freq: Three times a day (TID) | ORAL | 2 refills | Status: AC

## 2022-02-07 MED ORDER — LISDEXAMFETAMINE DIMESYLATE 70 MG PO CAPS: 70.0000 mg | ORAL_CAPSULE | Freq: Every day | ORAL | 0 refills | Status: DC

## 2022-02-07 MED ORDER — TIZANIDINE HCL 2 MG PO TABS: ORAL_TABLET | ORAL | 1 refills | Status: DC

## 2022-02-07 NOTE — Progress Notes (Signed)
? ?Subjective:  ? ?By signing my name below, I, Zite Okoli, attest that this documentation has been prepared under the direction and in the presence of Mosie Lukes, MD. 02/07/2022   ? ? Patient ID: Bonnie Lewis, female    DOB: 07-28-1986, 36 y.o.   MRN: [id number] ? ?Chief Complaint  ?Patient presents with  ? Annual Exam  ? ? ?HPI ?Patient is in today for an office visit. ? ?She is doing well at this time. ? ?She is taking 70 mg vyvanse and is doing well with it. She forgot to take it one day and felt tired through the day.  ?  ?She complains of cold sores that appeared yesterday.  ? ?She is requesting for a refill on 10 mg maxalt and 2 mg tizanidine. Her migraines are not as frequent and only occur during weather changes.  ? ?Past Medical History:  ?Diagnosis Date  ? ADD (attention deficit disorder)   ? Allergy 2010  ? Mild allergies after moving to [place]  ? Anal fistula   ? Cancer ([place]) 10/19/2012  ? Dyslipidemia   ? Migraine   ? Seasonal affective disorder ([place])   ? Wears contact lenses   ? ? ?Past Surgical History:  ?Procedure Laterality Date  ? ANAL FISTULOTOMY N/A 05/29/2020  ? Procedure: ANAL EXAM UNDER ANESTHESIA, ANAL FISTULOTOMY;  Surgeon: Leighton Ruff, MD;  Location: [facility];  Service: General;  Laterality: N/A;  ? NO PAST SURGERIES    ? ? ?Family History  ?Problem Relation Age of Onset  ? Asthma Father   ? Depression Father   ?     and anxiety  ? Allergies Father   ? Irritable bowel syndrome Father   ? Dementia Father   ? Depression Sister   ? Irritable bowel syndrome Sister   ? Migraines Sister   ? Depression Brother   ? Allergies Brother   ? Depression Maternal Grandmother   ?     Anxiety  ? Hypertension Maternal Grandmother   ? ADD / ADHD Maternal Grandmother   ? Menstrual problems Maternal Grandmother   ?     heavy bleeding requiring hysterectomy  ? Depression Paternal Grandmother   ? Cardiomyopathy Paternal Grandmother   ? Menstrual problems Paternal Grandmother   ? Hyperlipidemia  Mother   ? ADD / ADHD Mother   ? Anemia Sister   ? Coronary artery disease Paternal Grandfather   ? Cancer Paternal Aunt   ? Brain cancer Maternal Aunt   ? ? ?Social History  ? ?Socioeconomic History  ? Marital status: Single  ?  Spouse name: Not on file  ? Number of children: Not on file  ? Years of education: Not on file  ? Highest education level: Not on file  ?Occupational History  ? Not on file  ?Tobacco Use  ? Smoking status: Never  ? Smokeless tobacco: Never  ?Vaping Use  ? Vaping Use: Never used  ?Substance and Sexual Activity  ? Alcohol use: Yes  ?  Comment: occasional  ? Drug use: No  ? Sexual activity: Yes  ?  Birth control/protection: Pill  ?Other Topics Concern  ? Not on file  ?Social History Narrative  ? Not on file  ? ?Social Determinants of Health  ? ?Financial Resource Strain: Not on file  ?Food Insecurity: Not on file  ?Transportation Needs: Not on file  ?Physical Activity: Not on file  ?Stress: Not on file  ?Social Connections: Not on file  ?Intimate  Partner Violence: Not on file  ? ? ?Outpatient Medications Prior to Visit  ?Medication Sig Dispense Refill  ? citalopram (CELEXA) 40 MG tablet Take 1 tablet (40 mg total) by mouth daily. 90 tablet 1  ? KARIVA 0.15-0.02/0.01 MG (21/5) tablet TAKE 1 TABLET BY MOUTH EVERY DAY 84 tablet 3  ? lisdexamfetamine (VYVANSE) 70 MG capsule Take 1 capsule (70 mg total) by mouth daily. May 2023 30 capsule 0  ? lisdexamfetamine (VYVANSE) 70 MG capsule Take 1 capsule (70 mg total) by mouth daily. 30 capsule 0  ? rizatriptan (MAXALT) 10 MG tablet TAKE 1 TABLET (10 MG TOTAL) BY MOUTH AS NEEDED FOR MIGRAINE. MAY REPEAT IN 2 HOURS IF NEEDED (Patient taking differently: Take 10 mg by mouth as needed. TAKE 1 TABLET (10 MG TOTAL) BY MOUTH AS NEEDED FOR MIGRAINE. MAY REPEAT IN 2 HOURS IF NEEDED) 10 tablet 3  ? tiZANidine (ZANAFLEX) 2 MG tablet TAKE 1/2 TO 1 TABLET BY MOUTH 2 TIMES A DAY AS NEEDED FOR MUSCLE SPASMS 180 tablet 1  ? valACYclovir (VALTREX) 1000 MG tablet Take 2  tablets (2,000 mg total) by mouth 2 (two) times daily as needed. 4 tablet 2  ? ?No facility-administered medications prior to visit.  ? ? ?Allergies  ?Allergen Reactions  ? Amoxicillin-Pot Clavulanate Rash  ?  Mild facial rash  ? Sulfa Antibiotics Rash  ? ? ?Review of Systems  ?Constitutional:  Negative for fever.  ?HENT:  Negative for congestion.   ?     (+) cold sores  ?Eyes:  Negative for pain.  ?Respiratory:  Negative for shortness of breath.   ?Cardiovascular:  Negative for chest pain, palpitations and leg swelling.  ?Gastrointestinal:  Negative for abdominal pain, blood in stool, diarrhea and nausea.  ?Genitourinary:  Negative for dysuria and frequency.  ?Musculoskeletal:  Negative for back pain.  ?Neurological:  Negative for headaches.  ? ?   ?Objective:  ?  ?Physical Exam ?Constitutional:   ?   General: She is not in acute distress. ?   Appearance: She is well-developed.  ?HENT:  ?   Head: Normocephalic and atraumatic.  ?Eyes:  ?   Conjunctiva/sclera: Conjunctivae normal.  ?Neck:  ?   Thyroid: No thyromegaly.  ?Cardiovascular:  ?   Rate and Rhythm: Normal rate and regular rhythm.  ?   Heart sounds: Normal heart sounds. No murmur heard. ?Pulmonary:  ?   Effort: Pulmonary effort is normal. No respiratory distress.  ?   Breath sounds: Normal breath sounds.  ?Abdominal:  ?   General: Bowel sounds are normal. There is no distension.  ?   Palpations: Abdomen is soft. There is no mass.  ?   Tenderness: There is no abdominal tenderness.  ?Musculoskeletal:  ?   Cervical back: Neck supple.  ?Lymphadenopathy:  ?   Cervical: No cervical adenopathy.  ?Skin: ?   General: Skin is warm and dry.  ?Neurological:  ?   Mental Status: She is alert and oriented to person, place, and time.  ?Psychiatric:     ?   Behavior: Behavior normal.  ? ? ?BP 115/82 (BP Location: Left Arm, Patient Position: Sitting, Cuff Size: Normal)   Pulse 74   Resp 20   Ht 5' 4.5" (1.638 m)   Wt 235 lb 9.6 oz (106.9 kg)   SpO2 98%   BMI 39.82 kg/m?   ?Wt Readings from Last 3 Encounters:  ?02/07/22 235 lb 9.6 oz (106.9 kg)  ?07/09/21 241 lb 9.6 oz (109.6 kg)  ?  05/29/20 227 lb 8 oz (103.2 kg)  ? ? ?Diabetic Foot Exam - Simple   ?No data filed ?  ? ?Lab Results  ?Component Value Date  ? WBC 6.9 04/10/2017  ? HGB 12.5 04/10/2017  ? HCT 37.2 04/10/2017  ? PLT 327.0 04/10/2017  ? GLUCOSE 99 04/10/2017  ? CHOL 171 04/10/2017  ? TRIG 90.0 04/10/2017  ? HDL 58.60 04/10/2017  ? LDLDIRECT 110.1 10/19/2012  ? [place] 94 04/10/2017  ? ALT 36 (H) 04/10/2017  ? AST 28 04/10/2017  ? NA 137 04/10/2017  ? K 4.4 04/10/2017  ? CL 105 04/10/2017  ? CREATININE 0.86 04/10/2017  ? BUN 15 04/10/2017  ? CO2 23 04/10/2017  ? TSH 2.61 04/10/2017  ? ? ?Lab Results  ?Component Value Date  ? TSH 2.61 04/10/2017  ? ?Lab Results  ?Component Value Date  ? WBC 6.9 04/10/2017  ? HGB 12.5 04/10/2017  ? HCT 37.2 04/10/2017  ? MCV 88.5 04/10/2017  ? PLT 327.0 04/10/2017  ? ?Lab Results  ?Component Value Date  ? NA 137 04/10/2017  ? K 4.4 04/10/2017  ? CO2 23 04/10/2017  ? GLUCOSE 99 04/10/2017  ? BUN 15 04/10/2017  ? CREATININE 0.86 04/10/2017  ? BILITOT 0.3 04/10/2017  ? ALKPHOS 53 04/10/2017  ? AST 28 04/10/2017  ? ALT 36 (H) 04/10/2017  ? PROT 6.9 04/10/2017  ? ALBUMIN 4.3 04/10/2017  ? CALCIUM 9.2 04/10/2017  ? GFR 82.42 04/10/2017  ? ?Lab Results  ?Component Value Date  ? CHOL 171 04/10/2017  ? ?Lab Results  ?Component Value Date  ? HDL 58.60 04/10/2017  ? ?Lab Results  ?Component Value Date  ? [place] 94 04/10/2017  ? ?Lab Results  ?Component Value Date  ? TRIG 90.0 04/10/2017  ? ?Lab Results  ?Component Value Date  ? CHOLHDL 3 04/10/2017  ? ?No results found for: HGBA1C ? ?   ? ?Assessment & Plan:  ? ?Problem List Items Addressed This Visit   ? ? Mixed hyperlipidemia  ?  Encourage heart healthy diet such as MIND or DASH diet, increase exercise, avoid trans fats, simple carbohydrates and processed foods, consider a krill or fish or flaxseed oil cap daily.  ? ?  ?  ? Anemia  ? Preventative  health care - Primary  ?  Patient encouraged to maintain heart healthy diet, regular exercise, adequate sleep. Consider daily probiotics. Take medications as prescribed. Labs ordered and reviewed ? ?  ?  ? Migr

## 2022-02-07 NOTE — Assessment & Plan Note (Signed)
Patient encouraged to maintain heart healthy diet, regular exercise, adequate sleep. Consider daily probiotics. Take medications as prescribed. Labs ordered and reviewed 

## 2022-02-07 NOTE — Assessment & Plan Note (Addendum)
Tolerating Vyvanse, no concerning side effects. Continue current dose, UDS updated.  ?

## 2022-02-07 NOTE — Assessment & Plan Note (Signed)
Encourage heart healthy diet such as MIND or DASH diet, increase exercise, avoid trans fats, simple carbohydrates and processed foods, consider a krill or fish or flaxseed oil cap daily.  °

## 2022-02-07 NOTE — Patient Instructions (Addendum)
Encouraged increased hydration and fiber in diet. Daily probiotics. If bowels not moving can use MOM 2 tbls po in 4 oz of warm prune juice by mouth every 2-3 days. If no results then repeat in 4 hours with  Dulcolax suppository pr, may repeat again in 4 more hours as needed. Seek care if symptoms worsen. Consider daily Miralax and/or Dulcolax if symptoms persist ? ?Miralax with Benefiber whenever you hve to take a Maxalt ? ?Cold Sore ? ?A cold sore, also called a fever blister, is a small, fluid-filled sore that forms inside the mouth or on the lips, gums, nose, chin, or cheeks. Cold sores can spread to other parts of the body, such as the eyes, fingers, or genitals. ?Cold sores can spread from person to person (are contagious) until the sores crust over completely. Most cold sores go away within 2 weeks. ?What are the causes? ?Cold sores are caused by an infection from a common type of herpes simplex virus (HSV-1). HSV-1 is closely related to the HSV-2virus, which is the virus that causes genital herpes, but these viruses are not the same. Once a person is infected with HSV-1, the virus remains permanently in the body. ?HSV-1 is spread from person to person through close contact, such as through kissing, touching the affected area, or sharing personal items such as lip balm, razors, a drinking glass, or eating utensils. ?What increases the risk? ?You are more likely to develop this condition if you: ?Are tired, stressed, or sick. ?Are menstruating. ?Are pregnant. ?Take certain medicines. ?Are exposed to cold weather or too much sun. ?What are the signs or symptoms? ?Symptoms of a cold sore outbreak go through different stages. These are the stages of a cold sore: ?Tingling, itching, or burning is felt 1-2 days before the outbreak. ?Fluid-filled blisters appear on the lips, inside the mouth, on the nose, or on the cheeks. ?The blisters start to ooze clear fluid. ?The blisters dry up, and a yellow crust appears in  their place. ?The crust falls off. ?In some cases, other symptoms can develop during a cold sore outbreak. These can include: ?Fever. ?Sore throat. ?Headache. ?Muscle aches. ?Swollen neck glands. ?How is this diagnosed? ?This condition is diagnosed based on your medical history and a physical exam. Your health care provider may do a blood test or may swab some fluid from your sore and then examine the swab in the lab. ?How is this treated? ?There is no cure for cold sores or HSV-1. There is also no vaccine for HSV-1. Most cold sores go away on their own without treatment within 2 weeks. Medicines cannot make the infection go away, but your health care provider may prescribe medicines to: ?Help relieve some of the pain associated with the sores. ?Work to stop the virus from multiplying. ?Shorten healing time. ?Medicines may be in the form of creams, gels, pills, or a shot. ?Follow these instructions at home: ?Medicines ?Take or apply over-the-counter and prescription medicines only as told by your health care provider. ?Use a cotton-tip swab to apply creams or gels to your sores. ?Ask your health care provider if you can take lysine supplements. Research has found that lysine may help heal the cold sore faster and prevent outbreaks. ?Sore care ? ?Do not touch the sores or pick the scabs. ?Wash your hands often with soap and water for at least 20 seconds. Do not touch your eyes without washing your hands first. ?Keep the sores clean and dry. ?If directed, put ice  on the sores. To do this: ?Put ice in a plastic bag. ?Place a towel between your skin and the bag. ?Leave the ice on for 20 minutes, 2-3 times a day. ?Remove the ice if your skin turns bright red. This is very important. If you cannot feel pain, heat, or cold, you have a greater risk of damage to the area. ?Eating and drinking ?Eat a soft, bland diet. Avoid eating hot, cold, or salty foods. ?Use a straw if it hurts to drink out of a glass. ?Eat foods that are  rich in lysine, such as meat, fish, and dairy products. ?Avoid sugary foods, chocolates, nuts, and grains. These foods are rich in a nutrient called arginine, which can cause the virus to multiply. ?Lifestyle ?Do not kiss, have oral sex, or share personal items until your sores heal. ?Stress, poor sleep, and being out in the sun can trigger outbreaks. Make sure you: ?Do activities that help you relax, such as deep breathing exercises or meditation. ?Get enough sleep. ?Apply sunscreen on your lips before you go out in the sun. ?Contact a health care provider if: ?You have symptoms for more than 2 weeks. ?You have pus coming from the sores. ?You have redness that is spreading. ?You have pain or irritation in your eye. ?You get sores on your genitals. ?Your sores do not heal within 2 weeks. ?You have frequent cold sore outbreaks. ?Get help right away if: ?You have a fever and your symptoms suddenly get worse. ?You have a headache and confusion. ?You have tiredness (fatigue) or loss of appetite. ?You have a stiff neck or sensitivity to light. ?Summary ?A cold sore, also called a fever blister, is a small, fluid-filled sore that forms inside the mouth or on the lips, gums, nose, chin, or cheeks. ?Most cold sores go away on their own without treatment within 2 weeks. Your health care provider may prescribe medicines to help relieve some of the pain, work to stop the virus from multiplying, and shorten healing time. ?Wash your hands often with soap and water for at least 20 seconds. Do not touch your eyes without washing your hands first. ?Do not kiss, have oral sex, or share personal items until your sores heal. ?Contact a health care provider if your sores do not heal within 2 weeks. ?This information is not intended to replace advice given to you by your health care provider. Make sure you discuss any questions you have with your health care provider. ?Document Revised: 07/11/2021 Document Reviewed: 07/11/2021 ?Elsevier  Patient Education ? [place]. ? ?

## 2022-02-09 LAB — DRUG MONITORING PANEL 376104, URINE
Amphetamine: 1700 ng/mL — ABNORMAL HIGH (ref <250)
Amphetamines: POSITIVE ng/mL — AB (ref <500)
Barbiturates: NEGATIVE ng/mL (ref <300)
Benzodiazepines: NEGATIVE ng/mL (ref <100)
Cocaine Metabolite: NEGATIVE ng/mL (ref <150)
Desmethyltramadol: NEGATIVE ng/mL (ref <100)
Methamphetamine: NEGATIVE ng/mL (ref <250)
Opiates: NEGATIVE ng/mL (ref <100)
Oxycodone: NEGATIVE ng/mL (ref <100)
Tramadol: NEGATIVE ng/mL (ref <100)

## 2022-02-09 LAB — DM TEMPLATE

## 2022-02-10 NOTE — Assessment & Plan Note (Signed)
Encouraged increased hydration, 64 ounces of clear fluids daily. Minimize alcohol and caffeine. Eat small frequent meals with lean proteins and complex carbs. Avoid high and low blood sugars. Get adequate sleep, 7-8 hours a night. Needs exercise daily preferably in the morning. Given refillon Maxalt and Tizanidine to use prn ?

## 2022-05-15 ENCOUNTER — Encounter: Payer: Self-pay | Admitting: Family Medicine

## 2022-05-15 ENCOUNTER — Other Ambulatory Visit (HOSPITAL_BASED_OUTPATIENT_CLINIC_OR_DEPARTMENT_OTHER): Payer: Self-pay

## 2022-05-15 MED ORDER — LISDEXAMFETAMINE DIMESYLATE 70 MG PO CAPS
70.0000 mg | ORAL_CAPSULE | Freq: Every day | ORAL | 0 refills | Status: DC
  Filled 2022-05-15: qty 30, 30d supply, fill #0

## 2022-05-15 NOTE — Telephone Encounter (Signed)
Patient called to follow up on mychart message. She wanted to make sure that it was seen so she could pick up today. Advised patient that her message has been sent to the provider for approval.

## 2022-05-15 NOTE — Telephone Encounter (Signed)
Prescription sent

## 2022-05-15 NOTE — Telephone Encounter (Signed)
Can you send the Vyvnase to [facility] in PCP absence? I have pended the order.

## 2022-06-14 ENCOUNTER — Other Ambulatory Visit (HOSPITAL_BASED_OUTPATIENT_CLINIC_OR_DEPARTMENT_OTHER): Payer: Self-pay

## 2022-06-18 ENCOUNTER — Other Ambulatory Visit (HOSPITAL_BASED_OUTPATIENT_CLINIC_OR_DEPARTMENT_OTHER): Payer: Self-pay

## 2022-06-18 ENCOUNTER — Other Ambulatory Visit: Payer: Self-pay | Admitting: Family Medicine

## 2022-06-18 ENCOUNTER — Telehealth: Payer: Self-pay

## 2022-06-18 MED ORDER — LISDEXAMFETAMINE DIMESYLATE 70 MG PO CAPS
70.0000 mg | ORAL_CAPSULE | Freq: Every day | ORAL | 0 refills | Status: DC
  Filled 2022-06-18: qty 30, 30d supply, fill #0

## 2022-06-18 NOTE — Telephone Encounter (Signed)
Pt called to follow up on Vyvanse status. Pt advised that she is looking for an alternative as most of the pharmacies do not have it around her. Pt would like a call back when possible to get this straightened out.

## 2022-06-18 NOTE — Telephone Encounter (Signed)
Pt states she is unsure what her options are. She is not sure if a pharmacy nearby has vyvanse or if she just needs something else to hold her over. She would like a call from the nurse with advice, preferably today.   [facility] [address] [place], [place] [address]

## 2022-06-18 NOTE — Telephone Encounter (Signed)
Nurse Assessment Nurse: Velta Addison, RN, [place] Date/Time (Eastern Time): 06/17/2022 12:09:58 PM Confirm and document reason for call. If symptomatic, describe symptoms. ---Caller Bonnie Lewis sent a message last week about her rx. She says it is on backorder everywhere. She wants to get something prescribed to get through her honeymoon. States she is having some withdrawals. States she is not really having withdrawals, states she did not mean to say that. Vyvanse is what she needs, states she is taking some left over from 2018 and she only has one left. States she wants to know if there is something generic she can use. Does the patient have any new or worsening symptoms? ---No Guidelines Guideline Title Affirmed Question Affirmed Notes Nurse Date/Time Eilene Ghazi Time) Medication Question Call Prescription refill request for a controlled substance (such as most ADHD meds, opioids, benzodiazepines like Ativan [lorazepam] or Xanax [alprazolam]) Parrott, RN, [place] 06/17/2022 12:16:22 PM PLEASE NOTE: All timestamps contained within this report are represented as [place] Standard Time. CONFIDENTIALTY NOTICE: This fax transmission is intended only for the addressee. It contains information that is legally privileged, confidential or otherwise protected from use or disclosure. If you are not the intended recipient, you are strictly prohibited from reviewing, disclosing, copying using or disseminating any of this information or taking any action in reliance on or regarding this information. If you have received this fax in error, please notify us immediately by telephone so that we can arrange for its return to [place]. Phone: [phone], Toll-Free: [phone], Fax: [phone] Page: 2 of 2 Call Id: [id number] [place]. Time Eilene Ghazi Time) Disposition Final User 06/17/2022 11:21:24 AM Send to [place], RN, April 06/17/2022 12:18:37 PM Call PCP when Office is Open Yes Velta Addison, RN,  [place] Final Disposition 06/17/2022 12:18:37 PM Call PCP when Office is Open Yes Parrott, RN, Interior and spatial designer Understands Yes PreDisposition Call Doctor Care Advice Given Per Guideline CALL PCP WHEN OFFICE IS OPEN: * Call the office when it is open. * You need to discuss this with your child's doctor (or NP/PA) within the next few days. * Refills for that medication require a written prescription or electronic prescription requiring access to patient's electronic medical record. CONTROLLED SUBSTANCE PRESCRIPTION: * Call your doctor's office when it is open. CALL BACK IF: * You have other questions or concerns * Your child becomes worse CARE ADVICE given per Medication Question Call - No Triage (Pediatric) guideline

## 2022-06-18 NOTE — Addendum Note (Signed)
Addended byDamita Dunnings D on: 06/18/2022 10:56 AM   Modules accepted: Orders

## 2022-06-18 NOTE — Telephone Encounter (Signed)
Pt is on honeymoon at [facility], she is requesting Vyvanse Rx sent to   [facility] [address] [place], [place] [address]  Vyvanse on back order at [facility] before she left.

## 2022-06-19 ENCOUNTER — Other Ambulatory Visit: Payer: Self-pay | Admitting: Family Medicine

## 2022-06-19 MED ORDER — AMPHETAMINE-DEXTROAMPHET ER 30 MG PO CP24: 30.0000 mg | ORAL_CAPSULE | Freq: Every day | ORAL | 0 refills | Status: DC

## 2022-06-19 NOTE — Telephone Encounter (Signed)
Patient calling back to follow up on previous call regarding alternative to Vyvanse. Patient is out of medication and everywhere is out of stock on the vyvanse. She leaves for her honeymoon in a few days and would like to have Adderall XR 30 mg called into that location. That is all they have available right now to be able to fill for her. Please call patient today to advise.   [facility] [address] [place], [place] [address]; [phone]

## 2022-07-06 ENCOUNTER — Other Ambulatory Visit: Payer: Self-pay | Admitting: Family Medicine

## 2022-07-13 ENCOUNTER — Other Ambulatory Visit: Payer: Self-pay | Admitting: Family Medicine

## 2022-07-14 ENCOUNTER — Encounter: Payer: Self-pay | Admitting: Family Medicine

## 2022-07-15 MED ORDER — DESOGESTREL-ETHINYL ESTRADIOL 0.15-0.02/0.01 MG (21/5) PO TABS: 1.0000 | ORAL_TABLET | Freq: Every day | ORAL | 3 refills | Status: DC

## 2022-07-19 ENCOUNTER — Encounter: Payer: Self-pay | Admitting: Family Medicine

## 2022-07-19 MED ORDER — AMPHETAMINE-DEXTROAMPHET ER 30 MG PO CP24
30.0000 mg | ORAL_CAPSULE | Freq: Every day | ORAL | 0 refills | Status: DC
Start: 2022-07-19 — End: 2022-08-16

## 2022-07-19 NOTE — Telephone Encounter (Signed)
Requesting: Adderall XR '30MG'$  Contract: 07/09/21 UDS: 02/07/22 Last Visit: 02/07/22 Next Visit: 08/08/22 Last Refill: 06/19/22 #30 and 0RF   Please Advise

## 2022-08-08 ENCOUNTER — Other Ambulatory Visit (HOSPITAL_COMMUNITY)
Admission: RE | Admit: 2022-08-08 | Discharge: 2022-08-08 | Disposition: A | Discharge status: Home / Self Care | Payer: Commercial Managed Care - HMO | Source: Ambulatory Visit | Attending: Family Medicine | Admitting: Family Medicine

## 2022-08-08 ENCOUNTER — Ambulatory Visit (INDEPENDENT_AMBULATORY_CARE_PROVIDER_SITE_OTHER): Payer: Commercial Managed Care - HMO | Admitting: Family Medicine

## 2022-08-08 ENCOUNTER — Encounter: Payer: Self-pay | Admitting: Family Medicine

## 2022-08-08 VITALS — BP 120/82 | HR 87 | Temp 98.0°F | Resp 16 | Ht 64.0 in | Wt 236.0 lb

## 2022-08-08 DIAGNOSIS — E669 Obesity, unspecified: Secondary | ICD-10-CM | POA: Diagnosis not present

## 2022-08-08 DIAGNOSIS — M546 Pain in thoracic spine: Secondary | ICD-10-CM

## 2022-08-08 DIAGNOSIS — F988 Other specified behavioral and emotional disorders with onset usually occurring in childhood and adolescence: Secondary | ICD-10-CM | POA: Diagnosis not present

## 2022-08-08 DIAGNOSIS — E782 Mixed hyperlipidemia: Secondary | ICD-10-CM

## 2022-08-08 DIAGNOSIS — Z124 Encounter for screening for malignant neoplasm of cervix: Secondary | ICD-10-CM | POA: Diagnosis present

## 2022-08-08 DIAGNOSIS — Z0000 Encounter for general adult medical examination without abnormal findings: Secondary | ICD-10-CM | POA: Diagnosis not present

## 2022-08-08 DIAGNOSIS — Z23 Encounter for immunization: Secondary | ICD-10-CM | POA: Diagnosis not present

## 2022-08-08 DIAGNOSIS — D649 Anemia, unspecified: Secondary | ICD-10-CM

## 2022-08-08 DIAGNOSIS — F338 Other recurrent depressive disorders: Secondary | ICD-10-CM

## 2022-08-08 DIAGNOSIS — G43809 Other migraine, not intractable, without status migrainosus: Secondary | ICD-10-CM

## 2022-08-08 NOTE — Assessment & Plan Note (Signed)
Encourage heart healthy diet such as MIND or DASH diet, increase exercise, avoid trans fats, simple carbohydrates and processed foods, consider a krill or fish or flaxseed oil cap daily.  °

## 2022-08-08 NOTE — Assessment & Plan Note (Addendum)
Patient encouraged to maintain heart healthy diet, regular exercise, adequate sleep. Consider daily probiotics. Take medications as prescribed. Labs ordered and reviewed 

## 2022-08-08 NOTE — Assessment & Plan Note (Addendum)
Doing well on current meds but is aware she will need to stop her medications if she decides to proceed with pregnancy

## 2022-08-08 NOTE — Assessment & Plan Note (Signed)
Encouraged moist heat and gentle stretching as tolerated. May try NSAIDs and prescription meds as directed and report if symptoms worsen or seek immediate care 

## 2022-08-08 NOTE — Progress Notes (Signed)
Subjective:   By signing my name below, I, Kellie Simmering, attest that this documentation has been prepared under the direction and in the presence of Mosie Lukes, MD., 08/08/2022.    Patient ID: Bonnie Lewis, female    DOB: 05/02/1986, 36 y.o.   MRN: [id number]  Chief Complaint  Patient presents with   Annual Exam    Here for Annual Exam   HPI Patient is in today for a comprehensive physical exam and follow up on chronic medical concerns.   She denies having any fever, chills, ear pain, headaches, muscle pain, joint pain, new moles, rash, itching, congestion, sinus pain, sore throat, chest pain, palpitations, wheezing, nausea, vomitting, abdominal pain, diarrhea, constipation, blood in stool, dysuria, urgency, frequency and hematuria.  ADD: She currently takes ADDERALL 30 mg to manage her ADD.  Depression: She currently takes Citalopram 40 mg to manage her depression.  Dental: She is not up to date on dental care.  Family history: Her maternal great aunt passed away from a glioblastoma in her 51's.  Immunizations: She will receive the Flu immunization today. Immunization History  Administered Date(s) Administered   HPV Quadrivalent 01/28/2011, 06/19/2011   Hpv-Unspecified 12/19/2010   Influenza Split 10/19/2012   Influenza Whole 06/27/2010, 06/19/2011   Influenza, Seasonal, Injecte, Preservative Fre 07/15/2015   Influenza,inj,Quad PF,6+ Mos 06/09/2014, 08/14/2017, 09/03/2018, 11/04/2019, 08/08/2022   PFIZER(Purple Top)SARS-COV-2 Vaccination 12/22/2019, 01/12/2020   Td 04/13/2010   Pregnancy: She is inquiring what medications she will need to terminate if she is planning a pregnancy. She is interested in seeing a OBGYN.  Tizanidine: She reports that she is not consistently taking Tizanidine 2 mg and only takes it as needed.  Past Medical History:  Diagnosis Date   ADD (attention deficit disorder)    Allergy 2010   Mild allergies after moving to [place]   Anal fistula     Cancer ([place]) 10/19/2012   Dyslipidemia    Migraine    Seasonal affective disorder ([place])    Wears contact lenses    Past Surgical History:  Procedure Laterality Date   ANAL FISTULOTOMY N/A 05/29/2020   Procedure: ANAL EXAM UNDER ANESTHESIA, ANAL FISTULOTOMY;  Surgeon: Leighton Ruff, MD;  Location: [facility];  Service: General;  Laterality: N/A;   NO PAST SURGERIES     Family History  Problem Relation Age of Onset   Hyperlipidemia Mother    ADD / ADHD Mother    Asthma Father    Depression Father        and anxiety   Allergies Father    Irritable bowel syndrome Father    Dementia Father    Depression Sister    Irritable bowel syndrome Sister    Migraines Sister    Anemia Sister    Depression Brother    Allergies Brother    Brain cancer Maternal Aunt    Brain cancer Maternal Aunt    Cancer Paternal Aunt    Anemia Paternal Uncle    Depression Maternal Grandmother        Anxiety   Hypertension Maternal Grandmother    ADD / ADHD Maternal Grandmother    Menstrual problems Maternal Grandmother        heavy bleeding requiring hysterectomy   Depression Paternal Grandmother    Cardiomyopathy Paternal Grandmother    Menstrual problems Paternal Grandmother    Coronary artery disease Paternal Grandfather    Social History   Socioeconomic History   Marital status: Single    Spouse name:  Not on file   Number of children: Not on file   Years of education: Not on file   Highest education level: Not on file  Occupational History   Not on file  Tobacco Use   Smoking status: Never   Smokeless tobacco: Never  Vaping Use   Vaping Use: Never used  Substance and Sexual Activity   Alcohol use: Yes    Comment: occasional   Drug use: No   Sexual activity: Yes    Birth control/protection: Pill  Other Topics Concern   Not on file  Social History Narrative   Not on file   Social Determinants of Health   Financial Resource Strain: Not on file  Food Insecurity:  Not on file  Transportation Needs: Not on file  Physical Activity: Not on file  Stress: Not on file  Social Connections: Not on file  Intimate Partner Violence: Not on file   Outpatient Medications Prior to Visit  Medication Sig Dispense Refill   acyclovir (ZOVIRAX) 400 MG tablet Take 1 tablet (400 mg total) by mouth 3 (three) times daily. 21 tablet 2   amphetamine-dextroamphetamine (ADDERALL XR) 30 MG 24 hr capsule Take 1 capsule (30 mg total) by mouth daily. October 2023 30 capsule 0   citalopram (CELEXA) 40 MG tablet TAKE 1 TABLET BY MOUTH EVERY DAY 90 tablet 1   desogestrel-ethinyl estradiol (KARIVA) 0.15-0.02/0.01 MG (21/5) tablet Take 1 tablet by mouth daily. 84 tablet 3   rizatriptan (MAXALT) 10 MG tablet TAKE 1 TABLET (10 MG TOTAL) BY MOUTH AS NEEDED FOR MIGRAINE. MAY REPEAT IN 2 HOURS IF NEEDED 10 tablet 3   tiZANidine (ZANAFLEX) 2 MG tablet TAKE 1/2 TO 1 TABLET BY MOUTH 2 TIMES A DAY AS NEEDED FOR MUSCLE SPASMS 180 tablet 1   No facility-administered medications prior to visit.   Allergies  Allergen Reactions   Amoxicillin-Pot Clavulanate Rash    Mild facial rash   Sulfa Antibiotics Rash   Review of Systems  Constitutional:  Negative for chills and fever.  HENT:  Negative for congestion, ear pain, sinus pain and sore throat.   Respiratory:  Negative for cough, shortness of breath and wheezing.   Cardiovascular:  Negative for chest pain and palpitations.  Gastrointestinal:  Negative for abdominal pain, blood in stool, constipation, diarrhea, nausea and vomiting.  Genitourinary:  Negative for dysuria, frequency, hematuria and urgency.  Musculoskeletal:  Negative for joint pain and myalgias.  Skin:  Negative for itching and rash.       (-) New moles.  Neurological:  Negative for headaches.      Objective:    Physical Exam Constitutional:      General: She is not in acute distress.    Appearance: Normal appearance. She is not ill-appearing.  HENT:     Head:  Normocephalic and atraumatic.     Right Ear: Tympanic membrane, ear canal and external ear normal.     Left Ear: Tympanic membrane, ear canal and external ear normal.     Mouth/Throat:     Mouth: Mucous membranes are moist.     Pharynx: Oropharynx is clear.  Eyes:     Extraocular Movements: Extraocular movements intact.     Right eye: No nystagmus.     Left eye: No nystagmus.     Pupils: Pupils are equal, round, and reactive to light.  Neck:     Vascular: No carotid bruit.  Cardiovascular:     Rate and Rhythm: Normal rate and regular rhythm.  Pulses: Normal pulses.     Heart sounds: Normal heart sounds. No murmur heard.    No gallop.  Pulmonary:     Effort: Pulmonary effort is normal. No respiratory distress.     Breath sounds: Normal breath sounds. No wheezing or rales.  Abdominal:     General: Bowel sounds are normal.     Tenderness: There is no abdominal tenderness.  Musculoskeletal:     Comments: Muscle strength 5/5 on upper and lower extremities.   Lymphadenopathy:     Cervical: No cervical adenopathy.  Skin:    General: Skin is warm and dry.  Neurological:     Mental Status: She is alert and oriented to person, place, and time.     Sensory: Sensation is intact.     Motor: Motor function is intact.     Coordination: Coordination is intact.     Deep Tendon Reflexes:     Reflex Scores:      Patellar reflexes are 2+ on the right side and 2+ on the left side. Psychiatric:        Mood and Affect: Mood normal.        Behavior: Behavior normal.        Judgment: Judgment normal.    BP 120/82 (BP Location: Right Arm, Patient Position: Sitting, Cuff Size: Normal)   Pulse 87   Temp 98 F (36.7 C) (Oral)   Resp 16   Ht '5\' 4"'$  (1.626 m)   Wt 236 lb (107 kg)   SpO2 98%   BMI 40.51 kg/m  Wt Readings from Last 3 Encounters:  08/08/22 236 lb (107 kg)  02/07/22 235 lb 9.6 oz (106.9 kg)  07/09/21 241 lb 9.6 oz (109.6 kg)   Diabetic Foot Exam - Simple   No data filed     Lab Results  Component Value Date   WBC 6.9 08/08/2022   HGB 13.6 08/08/2022   HCT 40.4 08/08/2022   PLT 339.0 08/08/2022   GLUCOSE 92 08/08/2022   CHOL 223 (H) 08/08/2022   TRIG 101.0 08/08/2022   HDL 62.90 08/08/2022   LDLDIRECT 110.1 10/19/2012   LDLCALC 140 (H) 08/08/2022   ALT 70 (H) 08/08/2022   AST 47 (H) 08/08/2022   NA 138 08/08/2022   K 4.2 08/08/2022   CL 103 08/08/2022   CREATININE 0.85 08/08/2022   BUN 10 08/08/2022   CO2 27 08/08/2022   TSH 1.97 08/08/2022   Lab Results  Component Value Date   TSH 1.97 08/08/2022   Lab Results  Component Value Date   WBC 6.9 08/08/2022   HGB 13.6 08/08/2022   HCT 40.4 08/08/2022   MCV 88.7 08/08/2022   PLT 339.0 08/08/2022   Lab Results  Component Value Date   NA 138 08/08/2022   K 4.2 08/08/2022   CO2 27 08/08/2022   GLUCOSE 92 08/08/2022   BUN 10 08/08/2022   CREATININE 0.85 08/08/2022   BILITOT 0.4 08/08/2022   ALKPHOS 65 08/08/2022   AST 47 (H) 08/08/2022   ALT 70 (H) 08/08/2022   PROT 7.4 08/08/2022   ALBUMIN 4.6 08/08/2022   CALCIUM 9.6 08/08/2022   GFR 88.90 08/08/2022   Lab Results  Component Value Date   CHOL 223 (H) 08/08/2022   Lab Results  Component Value Date   HDL 62.90 08/08/2022   Lab Results  Component Value Date   LDLCALC 140 (H) 08/08/2022   Lab Results  Component Value Date   TRIG 101.0 08/08/2022   Lab  Results  Component Value Date   CHOLHDL 4 08/08/2022   No results found for: "HGBA1C"     Assessment & Plan:   Pap Smear: Last completed on 04/10/2017. Normal results. Repeat in 3-5 years.  Problem List Items Addressed This Visit     Backache    Encouraged moist heat and gentle stretching as tolerated. May try NSAIDs and prescription meds as directed and report if symptoms worsen or seek immediate care      Mixed hyperlipidemia    Encourage heart healthy diet such as MIND or DASH diet, increase exercise, avoid trans fats, simple carbohydrates and processed foods,  consider a krill or fish or flaxseed oil cap daily.       Relevant Orders   TSH (Completed)   Obesity    Encouraged DASH or MIND diet, decrease po intake and increase exercise as tolerated. Needs 7-8 hours of sleep nightly. Avoid trans fats, eat small, frequent meals every 4-5 hours with lean proteins, complex carbs and healthy fats. Minimize simple carbs, high fat foods and processed foods      Anemia   Relevant Orders   CBC (Completed)   TSH (Completed)   Preventative health care - Primary    Patient encouraged to maintain heart healthy diet, regular exercise, adequate sleep. Consider daily probiotics. Take medications as prescribed. Labs ordered and reviewed      Seasonal affective disorder ([place])    Has been staying on meds year round she is offered change from Citalopram to Sertraline but she declines for now. She will discuss with OB      Cervical cancer screening    Last pap 2018. Pap today, no concerns on exam. She has recently married and they are considering pregnancy, she will start a prenatal vitamin and let us know if she is ready for referral to OB      Relevant Orders   CBC (Completed)   Cytology - PAP( [place])   Migraine   ADD (attention deficit disorder)    Doing well on current meds but is aware she will need to stop her medications if she decides to proceed with pregnancy      Relevant Orders   Comprehensive metabolic panel (Completed)   Lipid panel (Completed)   Other Visit Diagnoses     Need for influenza vaccination       Relevant Orders   Flu Vaccine QUAD 6+ mos PF IM (Fluarix Quad PF) (Completed)      No orders of the defined types were placed in this encounter.  I, Penni Homans, MD, personally preformed the services described in this documentation.  All medical record entries made by the scribe were at my direction and in my presence.  I have reviewed the chart and discharge instructions (if applicable) and agree that the record reflects my  personal performance and is accurate and complete. 08/08/2022  I,Mohammed Iqbal,acting as a scribe for Penni Homans, MD.,have documented all relevant documentation on the behalf of Penni Homans, MD,as directed by  Penni Homans, MD while in the presence of Penni Homans, MD.  Penni Homans, MD

## 2022-08-08 NOTE — Patient Instructions (Addendum)
[facility], private [facility], [facility], [facility]  Start Prenatal vitamin  Consider switching to Sertraline from Citalopram  Stop the stimulants when ready to conceive  No Altha Harm tea do not drink   Preventive Care 59-35 Years Old, Female Preventive care refers to lifestyle choices and visits with your health care provider that can promote health and wellness. Preventive care visits are also called wellness exams. What can I expect for my preventive care visit? Counseling During your preventive care visit, your health care provider may ask about your: Medical history, including: Past medical problems. Family medical history. Pregnancy history. Current health, including: Menstrual cycle. Method of birth control. Emotional well-being. Home life and relationship well-being. Sexual activity and sexual health. Lifestyle, including: Alcohol, nicotine or tobacco, and drug use. Access to firearms. Diet, exercise, and sleep habits. Work and work Statistician. Sunscreen use. Safety issues such as seatbelt and bike helmet use. Physical exam Your health care provider may check your: Height and weight. These may be used to calculate your BMI (body mass index). BMI is a measurement that tells if you are at a healthy weight. Waist circumference. This measures the distance around your waistline. This measurement also tells if you are at a healthy weight and may help predict your risk of certain diseases, such as type 2 diabetes and high blood pressure. Heart rate and blood pressure. Body temperature. Skin for abnormal spots. What immunizations do I need?  Vaccines are usually given at various ages, according to a schedule. Your health care provider will recommend vaccines for you based on your age, medical history, and lifestyle or other factors, such as travel or where you work. What tests do I need? Screening Your  health care provider may recommend screening tests for certain conditions. This may include: Pelvic exam and Pap test. Lipid and cholesterol levels. Diabetes screening. This is done by checking your blood sugar (glucose) after you have not eaten for a while (fasting). Hepatitis B test. Hepatitis C test. HIV (human immunodeficiency virus) test. STI (sexually transmitted infection) testing, if you are at risk. BRCA-related cancer screening. This may be done if you have a family history of breast, ovarian, tubal, or peritoneal cancers. Talk with your health care provider about your test results, treatment options, and if necessary, the need for more tests. Follow these instructions at home: Eating and drinking  Eat a healthy diet that includes fresh fruits and vegetables, whole grains, lean protein, and low-fat dairy products. Take vitamin and mineral supplements as recommended by your health care provider. Do not drink alcohol if: Your health care provider tells you not to drink. You are pregnant, may be pregnant, or are planning to become pregnant. If you drink alcohol: Limit how much you have to 0-1 drink a day. Know how much alcohol is in your drink. In the [place] one drink equals one 12 oz bottle of beer (355 mL), one 5 oz glass of wine (148 mL), or one 1 oz glass of hard liquor (44 mL). Lifestyle Brush your teeth every morning and night with fluoride toothpaste. Floss one time each day. Exercise for at least 30 minutes 5 or more days each week. Do not use any products that contain nicotine or tobacco. These products include cigarettes, chewing tobacco, and vaping devices, such as e-cigarettes. If you need help quitting, ask your health care provider. Do not use drugs. If you are sexually active, practice safe sex. Use a condom or other form  of protection to prevent STIs. If you do not wish to become pregnant, use a form of birth control. If you plan to become pregnant, see your health  care provider for a prepregnancy visit. Find healthy ways to manage stress, such as: Meditation, yoga, or listening to music. Journaling. Talking to a trusted person. Spending time with friends and family. Minimize exposure to UV radiation to reduce your risk of skin cancer. Safety Always wear your seat belt while driving or riding in a vehicle. Do not drive: If you have been drinking alcohol. Do not ride with someone who has been drinking. If you have been using any mind-altering substances or drugs. While texting. When you are tired or distracted. Wear a helmet and other protective equipment during sports activities. If you have firearms in your house, make sure you follow all gun safety procedures. Seek help if you have been physically or sexually abused. What's next? Go to your health care provider once a year for an annual wellness visit. Ask your health care provider how often you should have your eyes and teeth checked. Stay up to date on all vaccines. This information is not intended to replace advice given to you by your health care provider. Make sure you discuss any questions you have with your health care provider. Document Revised: 03/28/2021 Document Reviewed: 03/28/2021 Elsevier Patient Education  [place].

## 2022-08-08 NOTE — Assessment & Plan Note (Addendum)
Last pap 2018. Pap today, no concerns on exam. She has recently married and they are considering pregnancy, she will start a prenatal vitamin and let us know if she is ready for referral to [facility]

## 2022-08-09 LAB — COMPREHENSIVE METABOLIC PANEL
ALT: 70 U/L — ABNORMAL HIGH (ref 0–35)
AST: 47 U/L — ABNORMAL HIGH (ref 0–37)
Albumin: 4.6 g/dL (ref 3.5–5.2)
Alkaline Phosphatase: 65 U/L (ref 39–117)
BUN: 10 mg/dL (ref 6–23)
CO2: 27 mEq/L (ref 19–32)
Calcium: 9.6 mg/dL (ref 8.4–10.5)
Chloride: 103 mEq/L (ref 96–112)
Creatinine, Ser: 0.85 mg/dL (ref 0.40–1.20)
GFR: 88.9 mL/min (ref >60.00)
Glucose, Bld: 92 mg/dL (ref 70–99)
Potassium: 4.2 mEq/L (ref 3.5–5.1)
Sodium: 138 mEq/L (ref 135–145)
Total Bilirubin: 0.4 mg/dL (ref 0.2–1.2)
Total Protein: 7.4 g/dL (ref 6.0–8.3)

## 2022-08-09 LAB — LIPID PANEL
Cholesterol: 223 mg/dL — ABNORMAL HIGH (ref 0–200)
HDL: 62.9 mg/dL (ref >39.00)
LDL Cholesterol: 140 mg/dL — ABNORMAL HIGH (ref 0–99)
NonHDL: 159.83
Total CHOL/HDL Ratio: 4
Triglycerides: 101 mg/dL (ref 0.0–149.0)
VLDL: 20.2 mg/dL (ref 0.0–40.0)

## 2022-08-09 LAB — CBC
HCT: 40.4 % (ref 36.0–46.0)
Hemoglobin: 13.6 g/dL (ref 12.0–15.0)
MCHC: 33.7 g/dL (ref 30.0–36.0)
MCV: 88.7 fl (ref 78.0–100.0)
Platelets: 339 10*3/uL (ref 150.0–400.0)
RBC: 4.56 Mil/uL (ref 3.87–5.11)
RDW: 13.6 % (ref 11.5–15.5)
WBC: 6.9 10*3/uL (ref 4.0–10.5)

## 2022-08-09 LAB — TSH: TSH: 1.97 u[IU]/mL (ref 0.35–5.50)

## 2022-08-10 NOTE — Assessment & Plan Note (Signed)
Encouraged DASH or MIND diet, decrease po intake and increase exercise as tolerated. Needs 7-8 hours of sleep nightly. Avoid trans fats, eat small, frequent meals every 4-5 hours with lean proteins, complex carbs and healthy fats. Minimize simple carbs, high fat foods and processed foods 

## 2022-08-10 NOTE — Assessment & Plan Note (Signed)
Has been staying on meds year round she is offered change from Citalopram to Sertraline but she declines for now. She will discuss with OB

## 2022-08-12 LAB — CYTOLOGY - PAP: Diagnosis: NEGATIVE

## 2022-08-15 ENCOUNTER — Encounter: Payer: Self-pay | Admitting: Family Medicine

## 2022-08-16 MED ORDER — AMPHETAMINE-DEXTROAMPHET ER 30 MG PO CP24: 30.0000 mg | ORAL_CAPSULE | Freq: Every day | ORAL | 0 refills | Status: DC

## 2022-08-16 NOTE — Addendum Note (Signed)
Addended byDamita Dunnings D on: 08/16/2022 11:18 AM   Modules accepted: Orders

## 2022-08-16 NOTE — Telephone Encounter (Signed)
Requesting: Adderall XR '30MG'$  Contract: 07/09/21 UDS: 02/07/22 Last Visit: 08/08/22 Next Visit: 02/03/23 Last Refill: 07/19/22 #30 and 0RF    Please Advise

## 2022-09-16 ENCOUNTER — Other Ambulatory Visit: Payer: Self-pay | Admitting: Family Medicine

## 2022-09-17 MED ORDER — AMPHETAMINE-DEXTROAMPHET ER 30 MG PO CP24: 30.0000 mg | ORAL_CAPSULE | Freq: Every day | ORAL | 0 refills | Status: DC

## 2022-09-17 NOTE — Telephone Encounter (Signed)
Requesting: Adderall XR '30mg'$   Contract:07/09/21 UDS:02/07/22 Last Visit: 08/08/22 Next Visit: 02/03/23 Last Refill: 08/16/22 #30 and 0RF   Please Advise

## 2022-10-16 ENCOUNTER — Encounter: Payer: Self-pay | Admitting: Family Medicine

## 2022-10-16 MED ORDER — AMPHETAMINE-DEXTROAMPHET ER 30 MG PO CP24: 30.0000 mg | ORAL_CAPSULE | Freq: Every day | ORAL | 0 refills | Status: DC

## 2022-10-16 NOTE — Telephone Encounter (Signed)
Requesting: Adderall XR '30mg'$   Contract: 07/09/21 UDS: 02/07/22 Last Visit: 08/08/22 Next Visit: 02/03/23 Last Refill: 09/17/22 #30 and 0RF   Please Advise

## 2022-11-16 ENCOUNTER — Encounter: Payer: Self-pay | Admitting: Family Medicine

## 2022-11-16 ENCOUNTER — Other Ambulatory Visit: Payer: Self-pay | Admitting: Family Medicine

## 2022-11-18 ENCOUNTER — Other Ambulatory Visit: Payer: Self-pay | Admitting: Family Medicine

## 2022-11-18 MED ORDER — AMPHETAMINE-DEXTROAMPHET ER 30 MG PO CP24: 30.0000 mg | ORAL_CAPSULE | Freq: Every day | ORAL | 0 refills | Status: DC

## 2022-11-18 NOTE — Telephone Encounter (Signed)
Requesting: Adderall  Contract: 07/09/21 UDS: 02/07/22 Last Visit: 08/08/22 Next Visit: 02/03/23 Last Refill: 10/16/22 #30 and 0RF   Please Advise

## 2022-12-05 ENCOUNTER — Other Ambulatory Visit: Payer: Self-pay | Admitting: Family Medicine

## 2023-01-17 ENCOUNTER — Other Ambulatory Visit: Payer: Self-pay | Admitting: Family Medicine

## 2023-02-02 NOTE — Assessment & Plan Note (Signed)
maintain DASH or MIND diet, decrease po intake and increase exercise as tolerated. Needs 7-8 hours of sleep nightly. Avoid trans fats, eat small, frequent meals every 4-5 hours with lean proteins, complex carbs and healthy fats. Minimize simple carbs, high fat foods and processed foods. Consider PCOS with a history of heavy and irregular periods until placed on OCP. Is married and considering pregnancy. Started on Metformin trial and asked to start a Multivitamin with iron and get at least 1 mg of Folic Acid daily

## 2023-02-02 NOTE — Assessment & Plan Note (Signed)
Maintain heart healthy diet such as MIND or DASH diet, increase exercise, avoid trans fats, simple carbohydrates and processed foods, consider a krill or fish or flaxseed oil cap daily.  

## 2023-02-02 NOTE — Assessment & Plan Note (Signed)
Encouraged increased hydration, 64 ounces of clear fluids daily. Minimize alcohol and caffeine. Eat small frequent meals with lean proteins and complex carbs. Avoid high and low blood sugars. Get adequate sleep, 7-8 hours a night. Needs exercise daily preferably in the morning.  

## 2023-02-03 ENCOUNTER — Ambulatory Visit (INDEPENDENT_AMBULATORY_CARE_PROVIDER_SITE_OTHER): Payer: 59 | Admitting: Family Medicine

## 2023-02-03 VITALS — BP 118/74 | HR 83 | Temp 98.0°F | Resp 16 | Ht 64.0 in | Wt 241.6 lb

## 2023-02-03 DIAGNOSIS — E782 Mixed hyperlipidemia: Secondary | ICD-10-CM | POA: Diagnosis not present

## 2023-02-03 DIAGNOSIS — G43809 Other migraine, not intractable, without status migrainosus: Secondary | ICD-10-CM

## 2023-02-03 DIAGNOSIS — E669 Obesity, unspecified: Secondary | ICD-10-CM | POA: Diagnosis not present

## 2023-02-03 DIAGNOSIS — F988 Other specified behavioral and emotional disorders with onset usually occurring in childhood and adolescence: Secondary | ICD-10-CM

## 2023-02-03 MED ORDER — AMPHETAMINE-DEXTROAMPHET ER 30 MG PO CP24: 30.0000 mg | ORAL_CAPSULE | Freq: Every day | ORAL | 0 refills | Status: DC

## 2023-02-03 MED ORDER — METFORMIN HCL 500 MG PO TABS: 500.0000 mg | ORAL_TABLET | Freq: Every day | ORAL | 5 refills | Status: DC

## 2023-02-03 NOTE — Progress Notes (Signed)
Subjective:   By signing my name below, I, [name], attest that this documentation has been prepared under the direction and in the presence of Bonnie Canary, MD. 02/03/2023   Patient ID: Bonnie Lewis, female    DOB: 1986-02-01, 37 y.o.   MRN: [id number]  No chief complaint on file.   HPI Patient is in today for a follow-up appointment.   Multivitamin She is inquiring about information on a multivitamin during this visit.   Gynecology  She is interested in receiving a referral for an OB GYN in the future. She is currently taking 0.15-0.02/0.01 mg Kariva daily PO for birth control. She has a present history of PCOS.    Past Medical History:  Diagnosis Date   ADD (attention deficit disorder)    Allergy 2010   Mild allergies after moving to [place]   Anal fistula    Cancer 10/19/2012   Dyslipidemia    Migraine    Seasonal affective disorder    Wears contact lenses     Past Surgical History:  Procedure Laterality Date   ANAL FISTULOTOMY N/A 05/29/2020   Procedure: ANAL EXAM UNDER ANESTHESIA, ANAL FISTULOTOMY;  Surgeon: Romie Levee, MD;  Location: [facility];  Service: General;  Laterality: N/A;   NO PAST SURGERIES      Family History  Problem Relation Age of Onset   Hyperlipidemia Mother    ADD / ADHD Mother    Asthma Father    Depression Father        and anxiety   Allergies Father    Irritable bowel syndrome Father    Dementia Father    Depression Sister    Irritable bowel syndrome Sister    Migraines Sister    Anemia Sister    Depression Brother    Allergies Brother    Brain cancer Maternal Aunt    Brain cancer Maternal Aunt    Cancer Paternal Aunt    Anemia Paternal Uncle    Depression Maternal Grandmother        Anxiety   Hypertension Maternal Grandmother    ADD / ADHD Maternal Grandmother    Menstrual problems Maternal Grandmother        heavy bleeding requiring hysterectomy   Depression Paternal Grandmother    Cardiomyopathy  Paternal Grandmother    Menstrual problems Paternal Grandmother    Coronary artery disease Paternal Grandfather     Social History   Socioeconomic History   Marital status: Married    Spouse name: Not on file   Number of children: Not on file   Years of education: Not on file   Highest education level: Bachelor's degree (e.g., BA, AB, BS)  Occupational History   Not on file  Tobacco Use   Smoking status: Never   Smokeless tobacco: Never  Vaping Use   Vaping Use: Never used  Substance and Sexual Activity   Alcohol use: Yes    Comment: occasional   Drug use: No   Sexual activity: Yes    Birth control/protection: Pill  Other Topics Concern   Not on file  Social History Narrative   Not on file   Social Determinants of Health   Financial Resource Strain: Medium Risk (02/03/2023)   Overall Financial Resource Strain (CARDIA)    Difficulty of Paying Living Expenses: Somewhat hard  Food Insecurity: No Food Insecurity (02/03/2023)   Hunger Vital Sign    Worried About Running Out of Food in the Last Year: Never true  Ran Out of Food in the Last Year: Never true  Transportation Needs: No Transportation Needs (02/03/2023)   PRAPARE - Administrator, Civil Service (Medical): No    Lack of Transportation (Non-Medical): No  Physical Activity: Unknown (02/03/2023)   Exercise Vital Sign    Days of Exercise per Week: Patient declined    Minutes of Exercise per Session: Not on file  Stress: Stress Concern Present (02/03/2023)   [employer] of Occupational Health - Occupational Stress Questionnaire    Feeling of Stress : To some extent  Social Connections: Socially Integrated (02/03/2023)   Social Connection and Isolation Panel [NHANES]    Frequency of Communication with Friends and Family: More than three times a week    Frequency of Social Gatherings with Friends and Family: Once a week    Attends Religious Services: More than 4 times per year    Active Member of  [employer] or Organizations: Yes    Attends Engineer, structural: More than 4 times per year    Marital Status: Married  Catering manager Violence: Not on file    Outpatient Medications Prior to Visit  Medication Sig Dispense Refill   acyclovir (ZOVIRAX) 400 MG tablet Take 1 tablet (400 mg total) by mouth 3 (three) times daily. 21 tablet 2   amphetamine-dextroamphetamine (ADDERALL XR) 30 MG 24 hr capsule Take 1 capsule (30 mg total) by mouth daily. February 2024 30 capsule 0   amphetamine-dextroamphetamine (ADDERALL XR) 30 MG 24 hr capsule Take 1 capsule (30 mg total) by mouth daily. March 2024 30 capsule 0   amphetamine-dextroamphetamine (ADDERALL XR) 30 MG 24 hr capsule Take 1 capsule (30 mg total) by mouth daily. 30 capsule 0   citalopram (CELEXA) 40 MG tablet TAKE 1 TABLET BY MOUTH EVERY DAY 90 tablet 2   desogestrel-ethinyl estradiol (KARIVA) 0.15-0.02/0.01 MG (21/5) tablet Take 1 tablet by mouth daily. 84 tablet 3   rizatriptan (MAXALT) 10 MG tablet TAKE 1 TABLET (10 MG TOTAL) BY MOUTH AS NEEDED FOR MIGRAINE. MAY REPEAT IN 2 HOURS IF NEEDED 10 tablet 3   tiZANidine (ZANAFLEX) 2 MG tablet TAKE 1/2 TO 1 TABLET BY MOUTH 2 TIMES A DAY AS NEEDED FOR MUSCLE SPASMS 180 tablet 1   No facility-administered medications prior to visit.    Allergies  Allergen Reactions   Amoxicillin-Pot Clavulanate Rash    Mild facial rash   Sulfa Antibiotics Rash    ROS     Objective:    Physical Exam Constitutional:      General: She is not in acute distress.    Appearance: Normal appearance.  HENT:     Head: Normocephalic and atraumatic.     Right Ear: External ear normal.     Left Ear: External ear normal.  Eyes:     Pupils: Pupils are equal, round, and reactive to light.  Cardiovascular:     Rate and Rhythm: Normal rate and regular rhythm.     Heart sounds: Normal heart sounds. No murmur heard.    No gallop.  Pulmonary:     Effort: Pulmonary effort is normal. No respiratory distress.      Breath sounds: Normal breath sounds. No wheezing or rales.  Skin:    General: Skin is warm.  Neurological:     Mental Status: She is alert and oriented to person, place, and time.  Psychiatric:        Judgment: Judgment normal.     There were no vitals taken for  this visit. Wt Readings from Last 3 Encounters:  08/08/22 236 lb (107 kg)  02/07/22 235 lb 9.6 oz (106.9 kg)  07/09/21 241 lb 9.6 oz (109.6 kg)       Assessment & Plan:  Mixed hyperlipidemia  Obesity, unspecified classification, unspecified obesity type, unspecified whether serious comorbidity present  Other migraine without status migrainosus, not intractable    I, [name], personally preformed the services described in this documentation.  All medical record entries made by the scribe were at my direction and in my presence.  I have reviewed the chart and discharge instructions (if applicable) and agree that the record reflects my personal performance and is accurate and complete. 02/03/2023  [name]   Mercer Pod as a scribe for Danise Edge, MD.,have documented all relevant documentation on the behalf of Danise Edge, MD,as directed by  Danise Edge, MD while in the presence of Danise Edge, MD.

## 2023-02-03 NOTE — Patient Instructions (Addendum)
Daily Multivitamin with minerals and Folic Acid daily   Fatty Liver Disease  The liver converts food into energy, removes toxic material from the blood, makes important proteins, and absorbs necessary vitamins from food. Fatty liver disease occurs when too much fat has built up in your liver cells. Fatty liver disease is also called hepatic steatosis. In many cases, fatty liver disease does not cause symptoms or problems. It is often diagnosed when tests are being done for other reasons. However, over time, fatty liver can cause inflammation that may lead to more serious liver problems, such as scarring of the liver (cirrhosis) and liver failure. Fatty liver is associated with insulin resistance, increased body fat, high blood pressure (hypertension), and high cholesterol. These are features of metabolic syndrome and increase your risk for stroke, diabetes, and heart disease. What are the causes? This condition may be caused by components of metabolic syndrome: Obesity. Insulin resistance. High cholesterol. Other causes: Alcohol abuse. Poor nutrition. Cushing syndrome. Pregnancy. Certain drugs. Poisons. Some viral infections. What increases the risk? You are more likely to develop this condition if you: Abuse alcohol. Are overweight. Have diabetes. Have hepatitis. Have a high triglyceride level. Are pregnant. What are the signs or symptoms? Fatty liver disease often does not cause symptoms. If symptoms do develop, they can include: Fatigue and weakness. Weight loss. Confusion. Nausea, vomiting, or abdominal pain. Yellowing of your skin and the white parts of your eyes (jaundice). Itchy skin. How is this diagnosed? This condition may be diagnosed by: A physical exam and your medical history. Blood tests. Imaging tests, such as an ultrasound, CT scan, or MRI. A liver biopsy. A small sample of liver tissue is removed using a needle. The sample is then looked at under a  microscope. How is this treated? Fatty liver disease is often caused by other health conditions. Treatment for fatty liver may involve medicines and lifestyle changes to manage conditions such as: Alcoholism. High cholesterol. Diabetes. Being overweight or obese. Follow these instructions at home:  Do not drink alcohol. If you have trouble quitting, ask your health care provider how to safely quit with the help of medicine or a supervised program. This is important to keep your condition from getting worse. Eat a healthy diet as told by your health care provider. Ask your health care provider about working with a dietitian to develop an eating plan. Exercise regularly. This can help you lose weight and control your cholesterol and diabetes. Talk to your health care provider about an exercise plan and which activities are best for you. Take over-the-counter and prescription medicines only as told by your health care provider. Keep all follow-up visits. This is important. Contact a health care provider if: You have trouble controlling your: Blood sugar. This is especially important if you have diabetes. Cholesterol. Drinking of alcohol. Get help right away if: You have abdominal pain. You have jaundice. You have nausea and are vomiting. You vomit blood or material that looks like coffee grounds. You have stools that are black, tar-like, or bloody. Summary Fatty liver disease develops when too much fat builds up in the cells of your liver. Fatty liver disease often causes no symptoms or problems. However, over time, fatty liver can cause inflammation that may lead to more serious liver problems, such as scarring of the liver (cirrhosis). You are more likely to develop this condition if you abuse alcohol, are pregnant, are overweight, have diabetes, have hepatitis, or have high triglyceride or cholesterol levels. Contact your  health care provider if you have trouble controlling your blood  sugar, cholesterol, or drinking of alcohol. This information is not intended to replace advice given to you by your health care provider. Make sure you discuss any questions you have with your health care provider. Document Revised: 07/13/2020 Document Reviewed: 07/13/2020 Elsevier Patient Education  2023 [employer].

## 2023-02-04 ENCOUNTER — Encounter: Payer: Self-pay | Admitting: Family Medicine

## 2023-02-04 NOTE — Assessment & Plan Note (Signed)
Refills given on Adderall XR which has been working well

## 2023-03-19 ENCOUNTER — Other Ambulatory Visit: Payer: Self-pay | Admitting: Family Medicine

## 2023-03-19 ENCOUNTER — Encounter: Payer: Self-pay | Admitting: Family Medicine

## 2023-05-16 ENCOUNTER — Other Ambulatory Visit: Payer: Self-pay | Admitting: Family Medicine

## 2023-05-16 ENCOUNTER — Encounter: Payer: Self-pay | Admitting: Family Medicine

## 2023-05-16 MED ORDER — AMPHETAMINE-DEXTROAMPHET ER 30 MG PO CP24: 30.0000 mg | ORAL_CAPSULE | Freq: Every day | ORAL | 0 refills | Status: DC

## 2023-05-16 NOTE — Telephone Encounter (Signed)
Requesting: Adderall XR 30mg   Contract: 02/27/23 UDS: 02/07/22 Last Visit: 02/03/23 Next Visit: 08/07/23 Last Refill: 02/03/23 #30 and 0RF (x3)  Please Advise

## 2023-07-16 ENCOUNTER — Other Ambulatory Visit: Payer: Self-pay | Admitting: Family Medicine

## 2023-07-16 ENCOUNTER — Encounter: Payer: Self-pay | Admitting: Family Medicine

## 2023-07-16 MED ORDER — AMPHETAMINE-DEXTROAMPHET ER 30 MG PO CP24: 30.0000 mg | ORAL_CAPSULE | Freq: Every day | ORAL | 0 refills | Status: DC

## 2023-07-16 NOTE — Telephone Encounter (Signed)
Requesting: Adderall XR 30mg   Contract: 02/07/22 UDS: 02/07/22 Last Visit: 02/03/23 Next Visit: None Last Refill: 05/16/23 #30 and 0RF   Please Advise

## 2023-07-16 NOTE — Telephone Encounter (Signed)
Duplicate request

## 2023-07-17 ENCOUNTER — Other Ambulatory Visit (HOSPITAL_BASED_OUTPATIENT_CLINIC_OR_DEPARTMENT_OTHER): Payer: Self-pay

## 2023-07-17 ENCOUNTER — Other Ambulatory Visit: Payer: Self-pay | Admitting: Family

## 2023-07-17 ENCOUNTER — Encounter: Payer: Self-pay | Admitting: Family Medicine

## 2023-07-17 MED ORDER — AMPHETAMINE-DEXTROAMPHET ER 30 MG PO CP24
30.0000 mg | ORAL_CAPSULE | Freq: Every day | ORAL | 0 refills | Status: DC
Start: 2023-07-17 — End: 2023-10-14
  Filled 2023-07-17 – 2023-09-15 (×3): qty 30, 30d supply, fill #0

## 2023-07-18 ENCOUNTER — Other Ambulatory Visit (HOSPITAL_BASED_OUTPATIENT_CLINIC_OR_DEPARTMENT_OTHER): Payer: Self-pay

## 2023-08-07 ENCOUNTER — Encounter: Payer: 59 | Admitting: Family Medicine

## 2023-08-10 ENCOUNTER — Other Ambulatory Visit: Payer: Self-pay | Admitting: Family Medicine

## 2023-09-05 ENCOUNTER — Other Ambulatory Visit: Payer: Self-pay | Admitting: Family Medicine

## 2023-09-15 ENCOUNTER — Other Ambulatory Visit: Payer: Self-pay | Admitting: Family

## 2023-09-15 ENCOUNTER — Encounter: Payer: Self-pay | Admitting: Family Medicine

## 2023-09-15 MED ORDER — AMPHETAMINE-DEXTROAMPHET ER 30 MG PO CP24: 30.0000 mg | ORAL_CAPSULE | Freq: Every day | ORAL | 0 refills | Status: DC

## 2023-09-15 NOTE — Telephone Encounter (Signed)
Requesting:Adderall XR 30 mg 24hr Capsule Contract: 02/07/2022 UDS: 02/07/2022 Last Visit:02/03/2023 Next Visit:none Last Refill: 07/17/2023  Please Advise

## 2023-09-16 ENCOUNTER — Other Ambulatory Visit: Payer: Self-pay

## 2023-10-03 ENCOUNTER — Other Ambulatory Visit: Payer: Self-pay | Admitting: Family Medicine

## 2023-10-13 NOTE — Assessment & Plan Note (Signed)
Patient encouraged to maintain heart healthy diet, regular exercise, adequate sleep. Consider daily probiotics. Take medications as prescribed. Labs ordered and reviewed 

## 2023-10-13 NOTE — Assessment & Plan Note (Signed)
Maintain heart healthy diet such as MIND or DASH diet, increase exercise, avoid trans fats, simple carbohydrates and processed foods, consider a krill or fish or flaxseed oil cap daily.  

## 2023-10-13 NOTE — Assessment & Plan Note (Signed)
Encouraged increased hydration, 64 ounces of clear fluids daily. Minimize alcohol and caffeine. Eat small frequent meals with lean proteins and complex carbs. Avoid high and low blood sugars. Get adequate sleep, 7-8 hours a night. Needs exercise daily preferably in the morning.  

## 2023-10-14 ENCOUNTER — Ambulatory Visit (INDEPENDENT_AMBULATORY_CARE_PROVIDER_SITE_OTHER): Payer: 59 | Admitting: Family Medicine

## 2023-10-14 VITALS — BP 128/74 | HR 99 | Temp 98.2°F | Resp 18 | Ht 64.0 in | Wt 236.0 lb

## 2023-10-14 DIAGNOSIS — Z0000 Encounter for general adult medical examination without abnormal findings: Secondary | ICD-10-CM

## 2023-10-14 DIAGNOSIS — F988 Other specified behavioral and emotional disorders with onset usually occurring in childhood and adolescence: Secondary | ICD-10-CM | POA: Diagnosis not present

## 2023-10-14 DIAGNOSIS — E282 Polycystic ovarian syndrome: Secondary | ICD-10-CM

## 2023-10-14 DIAGNOSIS — Z124 Encounter for screening for malignant neoplasm of cervix: Secondary | ICD-10-CM

## 2023-10-14 DIAGNOSIS — Z79899 Other long term (current) drug therapy: Secondary | ICD-10-CM

## 2023-10-14 DIAGNOSIS — G43809 Other migraine, not intractable, without status migrainosus: Secondary | ICD-10-CM | POA: Diagnosis not present

## 2023-10-14 DIAGNOSIS — E782 Mixed hyperlipidemia: Secondary | ICD-10-CM | POA: Diagnosis not present

## 2023-10-14 LAB — CBC WITH DIFFERENTIAL/PLATELET
Basophils Absolute: 0.1 10*3/uL (ref 0.0–0.1)
Basophils Relative: 0.8 % (ref 0.0–3.0)
Eosinophils Absolute: 0.1 10*3/uL (ref 0.0–0.7)
Eosinophils Relative: 1.6 % (ref 0.0–5.0)
HCT: 40.4 % (ref 36.0–46.0)
Hemoglobin: 13.1 g/dL (ref 12.0–15.0)
Lymphocytes Relative: 26.5 % (ref 12.0–46.0)
Lymphs Abs: 2 10*3/uL (ref 0.7–4.0)
MCHC: 32.5 g/dL (ref 30.0–36.0)
MCV: 88.4 fL (ref 78.0–100.0)
Monocytes Absolute: 0.4 10*3/uL (ref 0.1–1.0)
Monocytes Relative: 6 % (ref 3.0–12.0)
Neutro Abs: 4.9 10*3/uL (ref 1.4–7.7)
Neutrophils Relative %: 65.1 % (ref 43.0–77.0)
Platelets: 334 10*3/uL (ref 150.0–400.0)
RBC: 4.57 Mil/uL (ref 3.87–5.11)
RDW: 14 % (ref 11.5–15.5)
WBC: 7.5 10*3/uL (ref 4.0–10.5)

## 2023-10-14 LAB — LIPID PANEL
Cholesterol: 237 mg/dL — ABNORMAL HIGH (ref 0–200)
HDL: 66.3 mg/dL (ref >39.00)
LDL Cholesterol: 151 mg/dL — ABNORMAL HIGH (ref 0–99)
NonHDL: 170.88
Total CHOL/HDL Ratio: 4
Triglycerides: 98 mg/dL (ref 0.0–149.0)
VLDL: 19.6 mg/dL (ref 0.0–40.0)

## 2023-10-14 LAB — COMPREHENSIVE METABOLIC PANEL
ALT: 78 U/L — ABNORMAL HIGH (ref 0–35)
AST: 51 U/L — ABNORMAL HIGH (ref 0–37)
Albumin: 4.6 g/dL (ref 3.5–5.2)
Alkaline Phosphatase: 66 U/L (ref 39–117)
BUN: 13 mg/dL (ref 6–23)
CO2: 27 meq/L (ref 19–32)
Calcium: 9.4 mg/dL (ref 8.4–10.5)
Chloride: 100 meq/L (ref 96–112)
Creatinine, Ser: 0.85 mg/dL (ref 0.40–1.20)
GFR: 88.16 mL/min (ref >60.00)
Glucose, Bld: 95 mg/dL (ref 70–99)
Potassium: 4.3 meq/L (ref 3.5–5.1)
Sodium: 136 meq/L (ref 135–145)
Total Bilirubin: 0.6 mg/dL (ref 0.2–1.2)
Total Protein: 7.3 g/dL (ref 6.0–8.3)

## 2023-10-14 LAB — TSH: TSH: 1.66 u[IU]/mL (ref 0.35–5.50)

## 2023-10-14 MED ORDER — AMPHETAMINE-DEXTROAMPHET ER 30 MG PO CP24: 30.0000 mg | ORAL_CAPSULE | Freq: Every day | ORAL | 0 refills | Status: DC

## 2023-10-14 MED ORDER — MUPIROCIN 2 % EX OINT: 1.0000 | TOPICAL_OINTMENT | Freq: Three times a day (TID) | CUTANEOUS | 0 refills | Status: DC

## 2023-10-14 NOTE — Patient Instructions (Signed)

## 2023-10-17 LAB — DRUG MONITORING PANEL 376104, URINE
Amphetamine: 2772 ng/mL — ABNORMAL HIGH (ref <250)
Amphetamines: POSITIVE ng/mL — AB (ref <500)
Barbiturates: NEGATIVE ng/mL (ref <300)
Benzodiazepines: NEGATIVE ng/mL (ref <100)
Cocaine Metabolite: NEGATIVE ng/mL (ref <150)
Desmethyltramadol: NEGATIVE ng/mL (ref <100)
Methamphetamine: NEGATIVE ng/mL (ref <250)
Opiates: NEGATIVE ng/mL (ref <100)
Oxycodone: NEGATIVE ng/mL (ref <100)
Tramadol: NEGATIVE ng/mL (ref <100)

## 2023-10-17 LAB — DM TEMPLATE

## 2023-10-19 ENCOUNTER — Encounter: Payer: Self-pay | Admitting: Family Medicine

## 2023-10-19 NOTE — Progress Notes (Signed)
 Subjective:    Patient ID: Bonnie Lewis, female    DOB: 06-Jul-1986, 38 y.o.   MRN: [id number]  Chief Complaint  Patient presents with  . Annual Exam    HPI Discussed the use of AI scribe software for clinical note transcription with the patient, who gave verbal consent to proceed.  History of Present Illness   The patient, with a history of ADD and PCOS, presents with multiple concerns. They report taking Adderall daily for ADD and express a desire to switch back to Vyvanse , a medication they had previously been on for several years. They also mention being on metformin  for PCOS and are currently considering stopping their birth control (Kariva ) to assess their menstrual cycle without it and potentially try for a pregnancy.  In addition to these concerns, the patient also reports a skin issue on their head, which they believe to be a pilar cyst. They describe a compulsion to scratch at the area, which they believe may be related to stress. The patient also mentions having to pick up a refill of metformin  at the pharmacy and being off their birth control for about a week.  The patient is currently dealing with a significant amount of stress, including moving to a new location, changes at work, and recent family health issues. They express concern about managing these stressors and the potential impact on their health.        Past Medical History:  Diagnosis Date  . ADD (attention deficit disorder)   . Allergy 2010   Mild allergies after moving to [place]  . Anal fistula   . Cancer (HCC) 10/19/2012  . Dyslipidemia   . Migraine   . Seasonal affective disorder (HCC)   . Wears contact lenses     Past Surgical History:  Procedure Laterality Date  . ANAL FISTULOTOMY N/A 05/29/2020   Procedure: ANAL EXAM UNDER ANESTHESIA, ANAL FISTULOTOMY;  Surgeon: Debby Hila, MD;  Location: [facility];  Service: General;  Laterality: N/A;  . NO PAST SURGERIES      Family History   Problem Relation Age of Onset  . Hyperlipidemia Mother   . ADD / ADHD Mother   . Asthma Father   . Depression Father        and anxiety  . Allergies Father   . Irritable bowel syndrome Father   . Dementia Father   . Depression Sister   . Irritable bowel syndrome Sister   . Migraines Sister   . Anemia Sister   . Depression Brother   . Allergies Brother   . Brain cancer Maternal Aunt   . Brain cancer Maternal Aunt   . Cancer Paternal Aunt   . Anemia Paternal Uncle   . Depression Maternal Grandmother        Anxiety  . Hypertension Maternal Grandmother   . ADD / ADHD Maternal Grandmother   . Menstrual problems Maternal Grandmother        heavy bleeding requiring hysterectomy  . Depression Paternal Grandmother   . Cardiomyopathy Paternal Grandmother   . Menstrual problems Paternal Grandmother   . Coronary artery disease Paternal Grandfather     Social History   Socioeconomic History  . Marital status: Married    Spouse name: Not on file  . Number of children: Not on file  . Years of education: Not on file  . Highest education level: Bachelor's degree (e.g., BA, AB, BS)  Occupational History  . Not on file  Tobacco Use  .  Smoking status: Never  . Smokeless tobacco: Never  Vaping Use  . Vaping status: Never Used  Substance and Sexual Activity  . Alcohol use: Yes    Comment: occasional  . Drug use: No  . Sexual activity: Yes    Birth control/protection: Pill  Other Topics Concern  . Not on file  Social History Narrative  . Not on file   Social Drivers of Health   Financial Resource Strain: Patient Declined (10/13/2023)   Overall Financial Resource Strain (CARDIA)   . Difficulty of Paying Living Expenses: Patient declined  Food Insecurity: Patient Declined (10/13/2023)   Hunger Vital Sign   . Worried About Programme Researcher, Broadcasting/film/video in the Last Year: Patient declined   . Ran Out of Food in the Last Year: Patient declined  Transportation Needs: No Transportation  Needs (10/13/2023)   PRAPARE - Transportation   . Lack of Transportation (Medical): No   . Lack of Transportation (Non-Medical): No  Physical Activity: Unknown (10/13/2023)   Exercise Vital Sign   . Days of Exercise per Week: Patient declined   . Minutes of Exercise per Session: Not on file  Stress: Patient Declined (10/13/2023)   [employer] of Occupational Health - Occupational Stress Questionnaire   . Feeling of Stress : Patient declined  Social Connections: Unknown (10/13/2023)   Social Connection and Isolation Panel [NHANES]   . Frequency of Communication with Friends and Family: Patient declined   . Frequency of Social Gatherings with Friends and Family: Patient declined   . Attends Religious Services: More than 4 times per year   . Active Member of Clubs or Organizations: Yes   . Attends Banker Meetings: Patient declined   . Marital Status: Married  Catering Manager Violence: Not on file    Outpatient Medications Prior to Visit  Medication Sig Dispense Refill  . acyclovir  (ZOVIRAX ) 400 MG tablet Take 1 tablet (400 mg total) by mouth 3 (three) times daily. 21 tablet 2  . citalopram  (CELEXA ) 40 MG tablet TAKE 1 TABLET BY MOUTH EVERY DAY 30 tablet 8  . desogestrel -ethinyl estradiol  (KARIVA ) 0.15-0.02/0.01 MG (21/5) tablet Take 1 tablet by mouth daily. 84 tablet 0  . metFORMIN  (GLUCOPHAGE ) 500 MG tablet TAKE 1 TABLET BY MOUTH EVERY DAY WITH BREAKFAST 30 tablet 5  . rizatriptan  (MAXALT ) 10 MG tablet TAKE 1 TABLET (10 MG TOTAL) BY MOUTH AS NEEDED FOR MIGRAINE. MAY REPEAT IN 2 HOURS IF NEEDED 10 tablet 3  . tiZANidine  (ZANAFLEX ) 2 MG tablet TAKE 1/2 TO 1 TABLET BY MOUTH 2 TIMES A DAY AS NEEDED FOR MUSCLE SPASMS 180 tablet 1  . amphetamine -dextroamphetamine  (ADDERALL XR) 30 MG 24 hr capsule Take 1 capsule (30 mg total) by mouth daily. SEPTEMBER 2024 30 capsule 0  . amphetamine -dextroamphetamine  (ADDERALL XR) 30 MG 24 hr capsule Take 1 capsule (30 mg total) by mouth  daily. 30 capsule 0  . amphetamine -dextroamphetamine  (ADDERALL XR) 30 MG 24 hr capsule Take 1 capsule (30 mg total) by mouth daily. 30 capsule 0   No facility-administered medications prior to visit.    Allergies  Allergen Reactions  . Amoxicillin -Pot Clavulanate Rash    Mild facial rash  . Sulfa Antibiotics Rash    Review of Systems  Constitutional:  Positive for malaise/fatigue. Negative for chills and fever.  HENT:  Negative for congestion and hearing loss.   Eyes:  Negative for discharge.  Respiratory:  Negative for cough, sputum production and shortness of breath.   Cardiovascular:  Negative for chest  pain, palpitations and leg swelling.  Gastrointestinal:  Negative for abdominal pain, blood in stool, constipation, diarrhea, heartburn, nausea and vomiting.  Genitourinary:  Negative for dysuria, frequency, hematuria and urgency.  Musculoskeletal:  Negative for back pain, falls and myalgias.  Skin:  Negative for rash.  Neurological:  Negative for dizziness, sensory change, loss of consciousness, weakness and headaches.  Endo/Heme/Allergies:  Negative for environmental allergies. Does not bruise/bleed easily.  Psychiatric/Behavioral:  Positive for depression. Negative for suicidal ideas. The patient is nervous/anxious. The patient does not have insomnia.       Objective:    Physical Exam Constitutional:      General: She is not in acute distress.    Appearance: Normal appearance. She is not diaphoretic.  HENT:     Head: Normocephalic and atraumatic.     Right Ear: Tympanic membrane, ear canal and external ear normal.     Left Ear: Tympanic membrane, ear canal and external ear normal.     Nose: Nose normal.     Mouth/Throat:     Mouth: Mucous membranes are moist.     Pharynx: Oropharynx is clear. No oropharyngeal exudate.  Eyes:     General: No scleral icterus.       Right eye: No discharge.        Left eye: No discharge.     Conjunctiva/sclera: Conjunctivae normal.      Pupils: Pupils are equal, round, and reactive to light.  Neck:     Thyroid : No thyromegaly.  Cardiovascular:     Rate and Rhythm: Normal rate and regular rhythm.     Heart sounds: Normal heart sounds. No murmur heard. Pulmonary:     Effort: Pulmonary effort is normal. No respiratory distress.     Breath sounds: Normal breath sounds. No wheezing or rales.  Abdominal:     General: Bowel sounds are normal. There is no distension.     Palpations: Abdomen is soft. There is no mass.     Tenderness: There is no abdominal tenderness.  Musculoskeletal:        General: No tenderness. Normal range of motion.     Cervical back: Normal range of motion and neck supple.  Lymphadenopathy:     Cervical: No cervical adenopathy.  Skin:    General: Skin is warm and dry.     Findings: No rash.     Comments: Cystic acne and scares scattered throughout. Inflamed lesion on right parietal region of scalp. Oval, raised, 2x1 cm erythematous. No surrounding fluctuance  Neurological:     General: No focal deficit present.     Mental Status: She is alert and oriented to person, place, and time.     Cranial Nerves: No cranial nerve deficit.     Coordination: Coordination normal.     Deep Tendon Reflexes: Reflexes are normal and symmetric. Reflexes normal.  Psychiatric:        Mood and Affect: Mood normal.        Behavior: Behavior normal.        Thought Content: Thought content normal.        Judgment: Judgment normal.   BP 128/74 (BP Location: Left Arm, Patient Position: Sitting, Cuff Size: Large)   Pulse 99   Temp 98.2 F (36.8 C) (Oral)   Resp 18   Ht 5' 4 (1.626 m)   Wt 236 lb (107 kg)   SpO2 98%   BMI 40.51 kg/m  Wt Readings from Last 3 Encounters:  10/14/23 236 lb (107  kg)  02/03/23 241 lb 9.6 oz (109.6 kg)  08/08/22 236 lb (107 kg)    Diabetic Foot Exam - Simple   No data filed    Lab Results  Component Value Date   WBC 7.5 10/14/2023   HGB 13.1 10/14/2023   HCT 40.4 10/14/2023    PLT 334.0 10/14/2023   GLUCOSE 95 10/14/2023   CHOL 237 (H) 10/14/2023   TRIG 98.0 10/14/2023   HDL 66.30 10/14/2023   LDLDIRECT 110.1 10/19/2012   LDLCALC 151 (H) 10/14/2023   ALT 78 (H) 10/14/2023   AST 51 (H) 10/14/2023   NA 136 10/14/2023   K 4.3 10/14/2023   CL 100 10/14/2023   CREATININE 0.85 10/14/2023   BUN 13 10/14/2023   CO2 27 10/14/2023   TSH 1.66 10/14/2023    Lab Results  Component Value Date   TSH 1.66 10/14/2023   Lab Results  Component Value Date   WBC 7.5 10/14/2023   HGB 13.1 10/14/2023   HCT 40.4 10/14/2023   MCV 88.4 10/14/2023   PLT 334.0 10/14/2023   Lab Results  Component Value Date   NA 136 10/14/2023   K 4.3 10/14/2023   CO2 27 10/14/2023   GLUCOSE 95 10/14/2023   BUN 13 10/14/2023   CREATININE 0.85 10/14/2023   BILITOT 0.6 10/14/2023   ALKPHOS 66 10/14/2023   AST 51 (H) 10/14/2023   ALT 78 (H) 10/14/2023   PROT 7.3 10/14/2023   ALBUMIN 4.6 10/14/2023   CALCIUM 9.4 10/14/2023   GFR 88.16 10/14/2023   Lab Results  Component Value Date   CHOL 237 (H) 10/14/2023   Lab Results  Component Value Date   HDL 66.30 10/14/2023   Lab Results  Component Value Date   LDLCALC 151 (H) 10/14/2023   Lab Results  Component Value Date   TRIG 98.0 10/14/2023   Lab Results  Component Value Date   CHOLHDL 4 10/14/2023   No results found for: HGBA1C     Assessment & Plan:  Mixed hyperlipidemia Assessment & Plan: Maintain heart healthy diet such as MIND or DASH diet, increase exercise, avoid trans fats, simple carbohydrates and processed foods, consider a krill or fish or flaxseed oil cap daily.   Orders: -     CBC with Differential/Platelet  Preventative health care Assessment & Plan: Patient encouraged to maintain heart healthy diet, regular exercise, adequate sleep. Consider daily probiotics. Take medications as prescribed. Labs ordered and reviewed   Other migraine without status migrainosus, not intractable Assessment &  Plan: Encouraged increased hydration, 64 ounces of clear fluids daily. Minimize alcohol and caffeine. Eat small frequent meals with lean proteins and complex carbs. Avoid high and low blood sugars. Get adequate sleep, 7-8 hours a night. Needs exercise daily preferably in the morning.    Attention deficit disorder (ADD) without hyperactivity -     Comprehensive metabolic panel -     TSH -     Drug Monitoring Panel [phone] , Urine  Cervical cancer screening -     Lipid panel  PCOS (polycystic ovarian syndrome) -     Ambulatory referral to Obstetrics / Gynecology  High risk medication use -     Drug Monitoring Panel [phone] , Urine  Other orders -     Amphetamine -Dextroamphet ER; Take 1 capsule (30 mg total) by mouth daily. January 2025  Dispense: 30 capsule; Refill: 0 -     Amphetamine -Dextroamphet ER; Take 1 capsule (30 mg total) by mouth daily. February 2025  Dispense: 30 capsule; Refill: 0 -     Amphetamine -Dextroamphet ER; Take 1 capsule (30 mg total) by mouth daily. March 2025  Dispense: 30 capsule; Refill: 0 -     Mupirocin ; Apply 1 Application topically 3 (three) times daily.  Dispense: 22 g; Refill: 0 -     DM TEMPLATE    Assessment and Plan    Attention Deficit Disorder Currently on Adderall, patient expressed preference for Vyvanse . Discussed potential change in formulary coverage in the new year. -Refill Adderall for now, check with pharmacy in January regarding Vyvanse  coverage. -If Vyvanse  is affordable, consider switching back.  Polycystic Ovary Syndrome (PCOS) Currently on Metformin , recently stopped Kariva . Patient contemplating pregnancy and considering how to manage PCOS symptoms. -Continue Metformin . -Consider restarting Kariva  depending on menstrual cycle regularity and symptoms. -Start over-the-counter prenatal vitamins. -Referral to OB/GYN for further management and pregnancy planning.  Depression/Anxiety Currently on high dose Celexa , patient experiencing  increased stress. Discussed potential addition of Buspar or switch to Sertraline. -Continue Celexa  for now. -Consider adding Buspar or switching to Sertraline if stress continues to escalate.  Scalp Lesion Suspected pilar cyst, patient reports itching and scratching. -Apply hot compresses and debride with peroxide daily. -Apply prescribed Mupirocin . -If no improvement in two weeks, consider oral antibiotic (Minocycline).  General Health Maintenance -Check liver function and cholesterol levels. -Consider tetanus vaccine, due for update. -Schedule follow-up in 3 months (virtual) and 6 months (in-person).         Harlene Horton, MD

## 2023-10-20 NOTE — Progress Notes (Signed)
 Ordered for mixed Hyperlipidemia, in note

## 2023-10-22 ENCOUNTER — Encounter: Payer: Self-pay | Admitting: Family Medicine

## 2023-10-25 ENCOUNTER — Other Ambulatory Visit: Payer: Self-pay | Admitting: Family Medicine

## 2023-10-25 MED ORDER — CEFDINIR 300 MG PO CAPS: 300.0000 mg | ORAL_CAPSULE | Freq: Two times a day (BID) | ORAL | 0 refills | Status: AC

## 2023-10-27 MED ORDER — DOXYCYCLINE HYCLATE 100 MG PO TABS: 100.0000 mg | ORAL_TABLET | Freq: Two times a day (BID) | ORAL | 0 refills | Status: DC

## 2023-11-14 ENCOUNTER — Encounter: Payer: Self-pay | Admitting: Family Medicine

## 2023-11-14 ENCOUNTER — Other Ambulatory Visit: Payer: Self-pay | Admitting: Family Medicine

## 2024-01-10 ENCOUNTER — Other Ambulatory Visit: Payer: Self-pay | Admitting: Family Medicine

## 2024-01-12 ENCOUNTER — Encounter: Payer: Self-pay | Admitting: Family Medicine

## 2024-01-12 ENCOUNTER — Other Ambulatory Visit: Payer: Self-pay | Admitting: Emergency Medicine

## 2024-01-12 MED ORDER — AMPHETAMINE-DEXTROAMPHET ER 30 MG PO CP24: 30.0000 mg | ORAL_CAPSULE | Freq: Every day | ORAL | 0 refills | Status: DC

## 2024-01-12 NOTE — Telephone Encounter (Signed)
 Requesting: adderall Contract:10/14/23 UDS:10/14/23 Last Visit:10/14/23 Last refill:10/14/23 Please Advise

## 2024-01-13 ENCOUNTER — Telehealth: Payer: 59 | Admitting: Family Medicine

## 2024-01-13 NOTE — Telephone Encounter (Signed)
 Refill was sent 01/12/24 by Oran Rein

## 2024-02-01 ENCOUNTER — Other Ambulatory Visit: Payer: Self-pay | Admitting: Family Medicine

## 2024-02-11 ENCOUNTER — Other Ambulatory Visit: Payer: Self-pay | Admitting: Family Medicine

## 2024-02-11 MED ORDER — AMPHETAMINE-DEXTROAMPHET ER 30 MG PO CP24: 30.0000 mg | ORAL_CAPSULE | Freq: Every day | ORAL | 0 refills | Status: DC

## 2024-02-11 NOTE — Telephone Encounter (Signed)
 Requesting: Adderall XR 30mg  Contract: Yes UDS: 10/14/2023 Last Visit: 10/14/2023 Next Visit: 04/12/2024 Last Refill: 01/12/24   Please Advise

## 2024-03-12 ENCOUNTER — Encounter: Payer: Self-pay | Admitting: Family Medicine

## 2024-03-12 ENCOUNTER — Other Ambulatory Visit: Payer: Self-pay | Admitting: Family

## 2024-03-12 ENCOUNTER — Other Ambulatory Visit: Payer: Self-pay

## 2024-03-12 MED ORDER — AMPHETAMINE-DEXTROAMPHET ER 30 MG PO CP24: 30.0000 mg | ORAL_CAPSULE | Freq: Every day | ORAL | 0 refills | Status: DC

## 2024-03-12 NOTE — Telephone Encounter (Signed)
 Requesting: Adderall XR 30mg   Contract: 10/14/23 UDS: 10/14/23 Last Visit: 10/14/23 Next Visit: 04/12/24 Last Refill: 02/11/24 #30 and 0RF   Please Advise

## 2024-03-21 ENCOUNTER — Other Ambulatory Visit: Payer: Self-pay | Admitting: Family Medicine

## 2024-03-21 ENCOUNTER — Encounter: Payer: Self-pay | Admitting: Family Medicine

## 2024-03-23 ENCOUNTER — Other Ambulatory Visit: Payer: Self-pay | Admitting: Family Medicine

## 2024-03-23 MED ORDER — TIZANIDINE HCL 2 MG PO TABS: ORAL_TABLET | ORAL | 1 refills | Status: AC

## 2024-04-11 ENCOUNTER — Encounter: Payer: Self-pay | Admitting: Family Medicine

## 2024-04-11 NOTE — Assessment & Plan Note (Signed)
 Maintain heart healthy diet such as MIND or DASH diet, increase exercise, avoid trans fats, simple carbohydrates and processed foods, consider a krill or fish or flaxseed oil cap daily. Continue to monitor lipid panels

## 2024-04-11 NOTE — Assessment & Plan Note (Signed)
Refills given on Adderall XR which has been working well

## 2024-04-11 NOTE — Assessment & Plan Note (Signed)
Encouraged increased hydration, 64 ounces of clear fluids daily. Minimize alcohol and caffeine. Eat small frequent meals with lean proteins and complex carbs. Avoid high and low blood sugars. Get adequate sleep, 7-8 hours a night. Needs exercise daily preferably in the morning.  

## 2024-04-12 ENCOUNTER — Other Ambulatory Visit: Payer: Self-pay | Admitting: Family Medicine

## 2024-04-12 ENCOUNTER — Encounter: Payer: Self-pay | Admitting: Family Medicine

## 2024-04-12 ENCOUNTER — Ambulatory Visit: Payer: 59 | Admitting: Family Medicine

## 2024-04-12 VITALS — BP 136/88 | HR 99 | Resp 16 | Ht 64.0 in | Wt 236.8 lb

## 2024-04-12 DIAGNOSIS — F909 Attention-deficit hyperactivity disorder, unspecified type: Secondary | ICD-10-CM | POA: Diagnosis not present

## 2024-04-12 DIAGNOSIS — G43809 Other migraine, not intractable, without status migrainosus: Secondary | ICD-10-CM

## 2024-04-12 DIAGNOSIS — E782 Mixed hyperlipidemia: Secondary | ICD-10-CM

## 2024-04-12 DIAGNOSIS — E282 Polycystic ovarian syndrome: Secondary | ICD-10-CM | POA: Diagnosis not present

## 2024-04-12 DIAGNOSIS — Z23 Encounter for immunization: Secondary | ICD-10-CM

## 2024-04-12 MED ORDER — TIRZEPATIDE-WEIGHT MANAGEMENT 2.5 MG/0.5ML ~~LOC~~ SOLN: 2.5000 mg | SUBCUTANEOUS | 1 refills | Status: DC

## 2024-04-12 MED ORDER — ZEPBOUND 2.5 MG/0.5ML ~~LOC~~ SOAJ: 2.5000 mg | SUBCUTANEOUS | 1 refills | Status: DC

## 2024-04-12 MED ORDER — AMPHETAMINE-DEXTROAMPHET ER 30 MG PO CP24
30.0000 mg | ORAL_CAPSULE | Freq: Every day | ORAL | 0 refills | Status: DC
Start: 2024-04-12 — End: 2024-07-12

## 2024-04-12 MED ORDER — AMPHETAMINE-DEXTROAMPHET ER 30 MG PO CP24: 30.0000 mg | ORAL_CAPSULE | Freq: Every day | ORAL | 0 refills | Status: DC

## 2024-04-13 ENCOUNTER — Other Ambulatory Visit (HOSPITAL_COMMUNITY): Payer: Self-pay

## 2024-04-13 ENCOUNTER — Encounter: Payer: Self-pay | Admitting: Family Medicine

## 2024-04-13 ENCOUNTER — Telehealth: Payer: Self-pay

## 2024-04-13 NOTE — Telephone Encounter (Signed)
 Pharmacy Patient Advocate Encounter   Received notification from CoverMyMeds that prior authorization for Zepbound 2.5 is required/requested.   Insurance verification completed.   The patient is insured through [facility] .   Per test claim: PA required; PA submitted to above mentioned insurance via CoverMyMeds Key/confirmation #/EOC [id number] Status is pending

## 2024-04-13 NOTE — Telephone Encounter (Signed)
 Pharmacy Patient Advocate Encounter  Received notification from [employer] that Prior Authorization for Zepbound 2.5 has been APPROVED from 04/13/24 to 10/14/24. Ran test claim, Copay is $24.99. This test claim was processed through [facility]- copay amounts may vary at other pharmacies due to pharmacy/plan contracts, or as the patient moves through the different stages of their insurance plan.   PA #/Case ID/Reference #: [id number]

## 2024-04-13 NOTE — Progress Notes (Signed)
 Subjective:    Patient ID: Bonnie Lewis, female    DOB: 02-12-1986, 38 y.o.   MRN: [id number]  Chief Complaint  Patient presents with   Medical Management of Chronic Issues    Patient presents today for a 6 month follow-up.   Quality Metric Gaps    TDAP, Hep B    HPI Discussed the use of AI scribe software for clinical note transcription with the patient, who gave verbal consent to proceed.  History of Present Illness Bonnie Lewis is a 38 year old female with PCOS who presents for evaluation of weight management and potential pregnancy preparation.  She is concerned about her weight, which has been an issue since puberty, and its impact on her ability to conceive, especially as she approaches 38 years of age. She is interested in starting a medication that her husband has been using successfully for weight management. Her husband, who is on the same insurance plan, has been using a medication with a $25 copay for weight management, despite not being diabetic. She is hopeful that she can also get coverage for this medication under similar conditions, potentially for PCOS or metabolic dysfunction associated with steatohepatitis, as her liver markers and cholesterol were elevated in December.  She is currently taking Adderall and has recently ordered prenatal vitamins. She has not had recent lab work since December, where her liver markers and cholesterol were noted to be elevated. She is due for a tetanus booster. She is not currently taking a multivitamin or prenatal vitamin but has ordered prenatal gummies.  She has been experiencing stress due to multiple life changes, including starting a second job, moving, and family responsibilities. Her grandmother is moving in, which requires her to vacate her current residence. Additionally, her husband is applying for a new job, adding to the stress.    Past Medical History:  Diagnosis Date   ADD (attention deficit disorder)    Allergy 2010    Mild allergies after moving to [place]   Anal fistula    Cancer (HCC) 10/19/2012   Dyslipidemia    Migraine    Seasonal affective disorder (HCC)    Wears contact lenses     Past Surgical History:  Procedure Laterality Date   ANAL FISTULOTOMY N/A 05/29/2020   Procedure: ANAL EXAM UNDER ANESTHESIA, ANAL FISTULOTOMY;  Surgeon: Debby Hila, MD;  Location: [facility];  Service: General;  Laterality: N/A;   NO PAST SURGERIES      Family History  Problem Relation Age of Onset   Hyperlipidemia Mother    ADD / ADHD Mother    Asthma Father    Depression Father        and anxiety   Allergies Father    Irritable bowel syndrome Father    Dementia Father    Depression Sister    Irritable bowel syndrome Sister    Migraines Sister    Anemia Sister    Depression Brother    Allergies Brother    Brain cancer Maternal Aunt    Brain cancer Maternal Aunt    Cancer Paternal Aunt    Anemia Paternal Uncle    Depression Maternal Grandmother        Anxiety   Hypertension Maternal Grandmother    ADD / ADHD Maternal Grandmother    Menstrual problems Maternal Grandmother        heavy bleeding requiring hysterectomy   Depression Paternal Grandmother    Cardiomyopathy Paternal Grandmother    Menstrual problems Paternal Grandmother  Coronary artery disease Paternal Grandfather     Social History   Socioeconomic History   Marital status: Married    Spouse name: Not on file   Number of children: Not on file   Years of education: Not on file   Highest education level: Bachelor's degree (e.g., BA, AB, BS)  Occupational History   Not on file  Tobacco Use   Smoking status: Never   Smokeless tobacco: Never  Vaping Use   Vaping status: Never Used  Substance and Sexual Activity   Alcohol use: Yes    Comment: occasional   Drug use: No   Sexual activity: Yes    Birth control/protection: Pill  Other Topics Concern   Not on file  Social History Narrative   Not on file    Social Drivers of Health   Financial Resource Strain: Patient Declined (04/11/2024)   Overall Financial Resource Strain (CARDIA)    Difficulty of Paying Living Expenses: Patient declined  Food Insecurity: Patient Declined (04/11/2024)   Hunger Vital Sign    Worried About Running Out of Food in the Last Year: Patient declined    Ran Out of Food in the Last Year: Patient declined  Transportation Needs: Patient Declined (04/11/2024)   PRAPARE - Administrator, Civil Service (Medical): Patient declined    Lack of Transportation (Non-Medical): Patient declined  Physical Activity: Unknown (04/11/2024)   Exercise Vital Sign    Days of Exercise per Week: Patient declined    Minutes of Exercise per Session: Not on file  Stress: Patient Declined (04/11/2024)   [employer] of Occupational Health - Occupational Stress Questionnaire    Feeling of Stress: Patient declined  Social Connections: Unknown (04/11/2024)   Social Connection and Isolation Panel    Frequency of Communication with Friends and Family: Patient declined    Frequency of Social Gatherings with Friends and Family: Patient declined    Attends Religious Services: Patient declined    Database administrator or Organizations: Patient declined    Attends Engineer, structural: Not on file    Marital Status: Married  Catering manager Violence: Not on file    Outpatient Medications Prior to Visit  Medication Sig Dispense Refill   acyclovir  (ZOVIRAX ) 400 MG tablet Take 1 tablet (400 mg total) by mouth 3 (three) times daily. 21 tablet 2   amphetamine -dextroamphetamine  (ADDERALL XR) 30 MG 24 hr capsule Take 1 capsule (30 mg total) by mouth daily. June 2025 30 capsule 0   citalopram  (CELEXA ) 40 MG tablet TAKE 1 TABLET BY MOUTH EVERY DAY 30 tablet 8   metFORMIN  (GLUCOPHAGE ) 500 MG tablet Take 1 tablet (500 mg total) by mouth daily with breakfast. 90 tablet 1   mupirocin  ointment (BACTROBAN ) 2 % Apply 1 Application  topically 3 (three) times daily. 22 g 0   rizatriptan  (MAXALT ) 10 MG tablet TAKE 1 TABLET (10 MG TOTAL) BY MOUTH AS NEEDED FOR MIGRAINE. MAY REPEAT IN 2 HOURS IF NEEDED 10 tablet 3   tiZANidine  (ZANAFLEX ) 2 MG tablet TAKE 1/2 TO 1 TABLET BY MOUTH 2 TIMES A DAY AS NEEDED FOR MUSCLE SPASMS 60 tablet 1   desogestrel -ethinyl estradiol  (KARIVA ) 0.15-0.02/0.01 MG (21/5) tablet Take 1 tablet by mouth daily. 84 tablet 0   doxycycline  (VIBRA -TABS) 100 MG tablet Take 1 tablet (100 mg total) by mouth 2 (two) times daily. 20 tablet 0   No facility-administered medications prior to visit.    Allergies  Allergen Reactions   Cefdinir  Other (See  Comments)   Amoxicillin -Pot Clavulanate Rash    Mild facial rash   Sulfa Antibiotics Rash    Review of Systems  Constitutional:  Positive for malaise/fatigue. Negative for fever.  HENT:  Negative for congestion.   Eyes:  Negative for blurred vision.  Respiratory:  Negative for shortness of breath.   Cardiovascular:  Negative for chest pain, palpitations and leg swelling.  Gastrointestinal:  Negative for abdominal pain, blood in stool and nausea.  Genitourinary:  Negative for dysuria and frequency.  Musculoskeletal:  Negative for falls.  Skin:  Negative for rash.  Neurological:  Negative for dizziness, loss of consciousness and headaches.  Endo/Heme/Allergies:  Negative for environmental allergies.  Psychiatric/Behavioral:  Positive for depression. The patient is not nervous/anxious.        Objective:    Physical Exam Constitutional:      General: She is not in acute distress.    Appearance: Normal appearance. She is well-developed. She is not toxic-appearing.  HENT:     Head: Normocephalic and atraumatic.     Right Ear: External ear normal.     Left Ear: External ear normal.     Nose: Nose normal.   Eyes:     General:        Right eye: No discharge.        Left eye: No discharge.     Conjunctiva/sclera: Conjunctivae normal.   Neck:      Thyroid : No thyromegaly.   Cardiovascular:     Rate and Rhythm: Normal rate and regular rhythm.     Heart sounds: Normal heart sounds. No murmur heard. Pulmonary:     Effort: Pulmonary effort is normal. No respiratory distress.     Breath sounds: Normal breath sounds.  Abdominal:     General: Bowel sounds are normal.     Palpations: Abdomen is soft.     Tenderness: There is no abdominal tenderness. There is no guarding.   Musculoskeletal:        General: Normal range of motion.     Cervical back: Neck supple.  Lymphadenopathy:     Cervical: No cervical adenopathy.   Skin:    General: Skin is warm and dry.   Neurological:     Mental Status: She is alert and oriented to person, place, and time.   Psychiatric:        Mood and Affect: Mood normal.        Behavior: Behavior normal.        Thought Content: Thought content normal.        Judgment: Judgment normal.     BP 136/88   Pulse 99   Resp 16   Ht 5' 4 (1.626 m)   Wt 236 lb 12.8 oz (107.4 kg)   LMP 03/30/2024 (Exact Date)   SpO2 97%   BMI 40.65 kg/m  Wt Readings from Last 3 Encounters:  04/12/24 236 lb 12.8 oz (107.4 kg)  10/14/23 236 lb (107 kg)  02/03/23 241 lb 9.6 oz (109.6 kg)    Diabetic Foot Exam - Simple   No data filed    Lab Results  Component Value Date   WBC 7.5 10/14/2023   HGB 13.1 10/14/2023   HCT 40.4 10/14/2023   PLT 334.0 10/14/2023   GLUCOSE 95 10/14/2023   CHOL 237 (H) 10/14/2023   TRIG 98.0 10/14/2023   HDL 66.30 10/14/2023   LDLDIRECT 110.1 10/19/2012   LDLCALC 151 (H) 10/14/2023   ALT 78 (H) 10/14/2023   AST 51 (  H) 10/14/2023   NA 136 10/14/2023   K 4.3 10/14/2023   CL 100 10/14/2023   CREATININE 0.85 10/14/2023   BUN 13 10/14/2023   CO2 27 10/14/2023   TSH 1.66 10/14/2023    Lab Results  Component Value Date   TSH 1.66 10/14/2023   Lab Results  Component Value Date   WBC 7.5 10/14/2023   HGB 13.1 10/14/2023   HCT 40.4 10/14/2023   MCV 88.4 10/14/2023   PLT  334.0 10/14/2023   Lab Results  Component Value Date   NA 136 10/14/2023   K 4.3 10/14/2023   CO2 27 10/14/2023   GLUCOSE 95 10/14/2023   BUN 13 10/14/2023   CREATININE 0.85 10/14/2023   BILITOT 0.6 10/14/2023   ALKPHOS 66 10/14/2023   AST 51 (H) 10/14/2023   ALT 78 (H) 10/14/2023   PROT 7.3 10/14/2023   ALBUMIN 4.6 10/14/2023   CALCIUM 9.4 10/14/2023   GFR 88.16 10/14/2023   Lab Results  Component Value Date   CHOL 237 (H) 10/14/2023   Lab Results  Component Value Date   HDL 66.30 10/14/2023   Lab Results  Component Value Date   LDLCALC 151 (H) 10/14/2023   Lab Results  Component Value Date   TRIG 98.0 10/14/2023   Lab Results  Component Value Date   CHOLHDL 4 10/14/2023   No results found for: HGBA1C     Assessment & Plan:  Attention deficit hyperactivity disorder (ADHD), unspecified ADHD type Assessment & Plan: Refills given on Adderall XR which has been working well   Other migraine without status migrainosus, not intractable Assessment & Plan: Encouraged increased hydration, 64 ounces of clear fluids daily. Minimize alcohol and caffeine. Eat small frequent meals with lean proteins and complex carbs. Avoid high and low blood sugars. Get adequate sleep, 7-8 hours a night. Needs exercise daily preferably in the morning.    Mixed hyperlipidemia Assessment & Plan: Maintain heart healthy diet such as MIND or DASH diet, increase exercise, avoid trans fats, simple carbohydrates and processed foods, consider a krill or fish or flaxseed oil cap daily. Continue to monitor lipid panels   PCOS (polycystic ovarian syndrome) -     Ambulatory referral to Obstetrics / Gynecology  Need for diphtheria-tetanus-pertussis (Tdap) vaccine -     Tdap vaccine greater than or equal to 7yo IM  Other orders -     Amphetamine -Dextroamphet ER; Take 1 capsule (30 mg total) by mouth daily. July 2025  Dispense: 30 capsule; Refill: 0 -     Amphetamine -Dextroamphet ER; Take 1  capsule (30 mg total) by mouth daily. August 2025  Dispense: 30 capsule; Refill: 0    Assessment and Plan Assessment & Plan Polycystic Ovary Syndrome (PCOS) PCOS contributes to weight management issues and is a concern for conception. Zepbound is considered for weight loss and metabolic health improvement. Insurance coverage is uncertain, but coding for PCOS and metabolic dysfunction may help. She is aware of potential gastrointestinal side effects and dietary modifications to improve tolerance. - Prescribe Zepbound with a month's supply and a refill, coded for PCOS and metabolic dysfunction associated with steatohepatitis. - Advise monitoring for gastrointestinal side effects and report tolerance. - Instruct to send a note if ready to increase dosage after initial tolerance is established. - Discuss potential insurance issues and suggest checking with the pharmacist for diagnosis codes used for coverage. - Consider OB consultation if insurance issues persist.  Metabolic Dysfunction Associated with Steatohepatitis Metabolic dysfunction may contribute to weight issues.  Zepbound prescription is supported by this diagnosis for potential insurance coverage. Starting Zepbound could improve metabolic parameters and aid in achieving pregnancy. - Prescribe Zepbound as part of the treatment plan for metabolic dysfunction associated with steatohepatitis. - Discuss the importance of dietary modifications to reduce side effects and improve medication tolerance.  General Health Maintenance She is due for a tetanus booster and has discussed the hepatitis vaccine, which is not required. She is planning for pregnancy and has started prenatal vitamins. - Administer tetanus booster. - Advise against hepatitis vaccine at this time. - Encourage continuation of prenatal vitamins.  Follow-up Follow-up is planned in four to six months to assess progress with Zepbound and overall health. - Schedule follow-up  appointment in four to six months. - Instruct to send a note if ready to increase Zepbound dosage or if experiencing issues with insurance coverage.     Harlene Horton, MD

## 2024-04-15 ENCOUNTER — Other Ambulatory Visit: Payer: Self-pay | Admitting: Family Medicine

## 2024-04-15 ENCOUNTER — Other Ambulatory Visit (HOSPITAL_BASED_OUTPATIENT_CLINIC_OR_DEPARTMENT_OTHER): Payer: Self-pay

## 2024-04-15 ENCOUNTER — Other Ambulatory Visit (HOSPITAL_COMMUNITY): Payer: Self-pay

## 2024-04-15 MED ORDER — ZEPBOUND 2.5 MG/0.5ML ~~LOC~~ SOAJ
2.5000 mg | SUBCUTANEOUS | 1 refills | Status: DC
  Filled 2024-04-15 (×3): qty 2, 28d supply, fill #0

## 2024-04-30 ENCOUNTER — Encounter: Payer: Self-pay | Admitting: Family Medicine

## 2024-05-02 ENCOUNTER — Other Ambulatory Visit: Payer: Self-pay | Admitting: Family Medicine

## 2024-05-02 MED ORDER — TIRZEPATIDE-WEIGHT MANAGEMENT 5 MG/0.5ML ~~LOC~~ SOLN
5.0000 mg | SUBCUTANEOUS | 1 refills | Status: DC
Start: 2024-05-02 — End: 2024-05-03

## 2024-05-11 ENCOUNTER — Encounter: Payer: Self-pay | Admitting: Family Medicine

## 2024-05-14 ENCOUNTER — Other Ambulatory Visit: Payer: Self-pay | Admitting: Family Medicine

## 2024-05-14 ENCOUNTER — Encounter (HOSPITAL_BASED_OUTPATIENT_CLINIC_OR_DEPARTMENT_OTHER): Payer: Self-pay

## 2024-05-14 ENCOUNTER — Other Ambulatory Visit (HOSPITAL_BASED_OUTPATIENT_CLINIC_OR_DEPARTMENT_OTHER): Payer: Self-pay

## 2024-05-17 ENCOUNTER — Other Ambulatory Visit (HOSPITAL_BASED_OUTPATIENT_CLINIC_OR_DEPARTMENT_OTHER): Payer: Self-pay

## 2024-05-17 MED ORDER — ZEPBOUND 5 MG/0.5ML ~~LOC~~ SOAJ
5.0000 mg | SUBCUTANEOUS | 1 refills | Status: DC
  Filled 2024-05-17: qty 2, 28d supply, fill #0
  Filled 2024-06-09: qty 2, 28d supply, fill #1

## 2024-05-20 ENCOUNTER — Encounter: Payer: Self-pay | Admitting: Family Medicine

## 2024-05-29 ENCOUNTER — Other Ambulatory Visit: Payer: Self-pay | Admitting: Family Medicine

## 2024-06-09 ENCOUNTER — Encounter: Payer: Self-pay | Admitting: Family Medicine

## 2024-06-11 ENCOUNTER — Other Ambulatory Visit (HOSPITAL_BASED_OUTPATIENT_CLINIC_OR_DEPARTMENT_OTHER): Payer: Self-pay

## 2024-07-09 ENCOUNTER — Other Ambulatory Visit (HOSPITAL_BASED_OUTPATIENT_CLINIC_OR_DEPARTMENT_OTHER): Payer: Self-pay

## 2024-07-09 ENCOUNTER — Other Ambulatory Visit: Payer: Self-pay | Admitting: Family Medicine

## 2024-07-09 MED ORDER — ZEPBOUND 5 MG/0.5ML ~~LOC~~ SOAJ
5.0000 mg | SUBCUTANEOUS | 1 refills | Status: DC
  Filled 2024-07-09: qty 2, 28d supply, fill #0
  Filled 2024-08-10: qty 2, 28d supply, fill #1

## 2024-07-12 ENCOUNTER — Encounter: Payer: Self-pay | Admitting: Family Medicine

## 2024-07-12 MED ORDER — AMPHETAMINE-DEXTROAMPHET ER 30 MG PO CP24: 30.0000 mg | ORAL_CAPSULE | Freq: Every day | ORAL | 0 refills | Status: DC

## 2024-07-12 NOTE — Telephone Encounter (Signed)
 Requesting: Adderall 30 MG Contract: 10/14/2023 UDS: 10/14/2023 Last Visit: 04/12/2024 Next Visit: 08/16/2024 Last Refill: 06/12/2024  Please Advise

## 2024-07-27 ENCOUNTER — Other Ambulatory Visit: Payer: Self-pay | Admitting: Family Medicine

## 2024-08-15 NOTE — Assessment & Plan Note (Signed)
 Maintain heart healthy diet such as MIND or DASH diet, increase exercise, avoid trans fats, simple carbohydrates and processed foods, consider a krill or fish or flaxseed oil cap daily. Continue to monitor lipid panels

## 2024-08-15 NOTE — Assessment & Plan Note (Signed)
 Has been staying on meds year round

## 2024-08-15 NOTE — Progress Notes (Unsigned)
 Subjective:    Patient ID: Bonnie Lewis, female    DOB: [birth year], 38 y.o.   MRN: [id number]  No chief complaint on file.   HPI Discussed the use of AI scribe software for clinical note transcription with the patient, who gave verbal consent to proceed.  History of Present Illness     Past Medical History:  Diagnosis Date  . ADD (attention deficit disorder)   . Allergy 2010   Mild allergies after moving to [place]  . Anal fistula   . Cancer (HCC) 10/19/2012  . Dyslipidemia   . Migraine   . Seasonal affective disorder   . Wears contact lenses     Past Surgical History:  Procedure Laterality Date  . ANAL FISTULOTOMY N/A 05/29/2020   Procedure: ANAL EXAM UNDER ANESTHESIA, ANAL FISTULOTOMY;  Surgeon: Debby Hila, MD;  Location: [facility] [place];  Service: General;  Laterality: N/A;  . NO PAST SURGERIES      Family History  Problem Relation Age of Onset  . Hyperlipidemia Mother   . ADD / ADHD Mother   . Asthma Father   . Depression Father        and anxiety  . Allergies Father   . Irritable bowel syndrome Father   . Dementia Father   . Depression Sister   . Irritable bowel syndrome Sister   . Migraines Sister   . Anemia Sister   . Depression Brother   . Allergies Brother   . Brain cancer Maternal Aunt   . Brain cancer Maternal Aunt   . Cancer Paternal Aunt   . Anemia Paternal Uncle   . Depression Maternal Grandmother        Anxiety  . Hypertension Maternal Grandmother   . ADD / ADHD Maternal Grandmother   . Menstrual problems Maternal Grandmother        heavy bleeding requiring hysterectomy  . Depression Paternal Grandmother   . Cardiomyopathy Paternal Grandmother   . Menstrual problems Paternal Grandmother   . Coronary artery disease Paternal Grandfather     Social History   Socioeconomic History  . Marital status: Married    Spouse name: Not on file  . Number of children: Not on file  . Years of education: Not on file  . Highest education  level: Bachelor's degree (e.g., BA, AB, BS)  Occupational History  . Not on file  Tobacco Use  . Smoking status: Never  . Smokeless tobacco: Never  Vaping Use  . Vaping status: Never Used  Substance and Sexual Activity  . Alcohol use: Yes    Comment: occasional  . Drug use: No  . Sexual activity: Yes    Birth control/protection: Pill  Other Topics Concern  . Not on file  Social History Narrative  . Not on file   Social Drivers of Health   Financial Resource Strain: Patient Declined (08/15/2024)   Overall Financial Resource Strain (CARDIA)   . Difficulty of Paying Living Expenses: Patient declined  Food Insecurity: Patient Declined (08/15/2024)   Hunger Vital Sign   . Worried About Programme Researcher, Broadcasting/film/video in the Last Year: Patient declined   . Ran Out of Food in the Last Year: Patient declined  Transportation Needs: Patient Declined (08/15/2024)   PRAPARE - Transportation   . Lack of Transportation (Medical): Patient declined   . Lack of Transportation (Non-Medical): Patient declined  Physical Activity: Unknown (08/15/2024)   Exercise Vital Sign   . Days of Exercise per Week: Patient declined   .  Minutes of Exercise per Session: Not on file  Stress: Patient Declined (08/15/2024)   [employer] of Occupational Health - Occupational Stress Questionnaire   . Feeling of Stress: Patient declined  Social Connections: Unknown (08/15/2024)   Social Connection and Isolation Panel   . Frequency of Communication with Friends and Family: Patient declined   . Frequency of Social Gatherings with Friends and Family: Patient declined   . Attends Religious Services: Patient declined   . Active Member of Clubs or Organizations: Patient declined   . Attends Banker Meetings: Not on file   . Marital Status: Patient declined  Intimate Partner Violence: Not on file    Outpatient Medications Prior to Visit  Medication Sig Dispense Refill  . acyclovir  (ZOVIRAX ) 400 MG tablet Take 1  tablet (400 mg total) by mouth 3 (three) times daily. 21 tablet 2  . amphetamine -dextroamphetamine  (ADDERALL XR) 30 MG 24 hr capsule Take 1 capsule (30 mg total) by mouth daily. Sept 2025 30 capsule 0  . amphetamine -dextroamphetamine  (ADDERALL XR) 30 MG 24 hr capsule Take 1 capsule (30 mg total) by mouth daily. Oct 2025 30 capsule 0  . amphetamine -dextroamphetamine  (ADDERALL XR) 30 MG 24 hr capsule Take 1 capsule (30 mg total) by mouth daily. Nov 2025 30 capsule 0  . citalopram  (CELEXA ) 40 MG tablet TAKE 1 TABLET BY MOUTH EVERY DAY 30 tablet 5  . metFORMIN  (GLUCOPHAGE ) 500 MG tablet TAKE 1 TABLET BY MOUTH EVERY DAY WITH BREAKFAST 30 tablet 5  . mupirocin  ointment (BACTROBAN ) 2 % Apply 1 Application topically 3 (three) times daily. 22 g 0  . rizatriptan  (MAXALT ) 10 MG tablet TAKE 1 TABLET (10 MG TOTAL) BY MOUTH AS NEEDED FOR MIGRAINE. MAY REPEAT IN 2 HOURS IF NEEDED 10 tablet 3  . tirzepatide  (ZEPBOUND ) 5 MG/0.5ML Pen Inject 5 mg into the skin once a week. 2 mL 1  . tiZANidine  (ZANAFLEX ) 2 MG tablet TAKE 1/2 TO 1 TABLET BY MOUTH 2 TIMES A DAY AS NEEDED FOR MUSCLE SPASMS 60 tablet 1   No facility-administered medications prior to visit.    Allergies  Allergen Reactions  . Cefdinir  Other (See Comments)  . Amoxicillin -Pot Clavulanate Rash    Mild facial rash  . Sulfa Antibiotics Rash    Review of Systems  Constitutional:  Positive for malaise/fatigue. Negative for fever.  HENT:  Negative for congestion.   Eyes:  Negative for blurred vision.  Respiratory:  Negative for shortness of breath.   Cardiovascular:  Negative for chest pain, palpitations and leg swelling.  Gastrointestinal:  Negative for abdominal pain, blood in stool and nausea.  Genitourinary:  Negative for dysuria and frequency.  Musculoskeletal:  Negative for falls.  Skin:  Negative for rash.  Neurological:  Negative for dizziness, loss of consciousness and headaches.  Endo/Heme/Allergies:  Negative for environmental  allergies.  Psychiatric/Behavioral:  Positive for depression. The patient is nervous/anxious.        Objective:    Physical Exam Constitutional:      General: She is not in acute distress.    Appearance: Normal appearance. She is well-developed. She is not toxic-appearing.  HENT:     Head: Normocephalic and atraumatic.     Right Ear: External ear normal.     Left Ear: External ear normal.     Nose: Nose normal.  Eyes:     General:        Right eye: No discharge.        Left eye: No discharge.  Conjunctiva/sclera: Conjunctivae normal.  Neck:     Thyroid : No thyromegaly.  Cardiovascular:     Rate and Rhythm: Normal rate and regular rhythm.     Heart sounds: Normal heart sounds. No murmur heard. Pulmonary:     Effort: Pulmonary effort is normal. No respiratory distress.     Breath sounds: Normal breath sounds.  Abdominal:     General: Bowel sounds are normal.     Palpations: Abdomen is soft.     Tenderness: There is no abdominal tenderness. There is no guarding.  Musculoskeletal:        General: Normal range of motion.     Cervical back: Neck supple.  Lymphadenopathy:     Cervical: No cervical adenopathy.  Skin:    General: Skin is warm and dry.  Neurological:     Mental Status: She is alert and oriented to person, place, and time.  Psychiatric:        Mood and Affect: Mood normal.        Behavior: Behavior normal.        Thought Content: Thought content normal.        Judgment: Judgment normal.    There were no vitals taken for this visit. Wt Readings from Last 3 Encounters:  04/12/24 236 lb 12.8 oz (107.4 kg)  10/14/23 236 lb (107 kg)  02/03/23 241 lb 9.6 oz (109.6 kg)    Diabetic Foot Exam - Simple   No data filed    Lab Results  Component Value Date   WBC 7.5 10/14/2023   HGB 13.1 10/14/2023   HCT 40.4 10/14/2023   PLT 334.0 10/14/2023   GLUCOSE 95 10/14/2023   CHOL 237 (H) 10/14/2023   TRIG 98.0 10/14/2023   HDL 66.30 10/14/2023   LDLDIRECT  110.1 10/19/2012   LDLCALC 151 (H) 10/14/2023   ALT 78 (H) 10/14/2023   AST 51 (H) 10/14/2023   NA 136 10/14/2023   K 4.3 10/14/2023   CL 100 10/14/2023   CREATININE 0.85 10/14/2023   BUN 13 10/14/2023   CO2 27 10/14/2023   TSH 1.66 10/14/2023    Lab Results  Component Value Date   TSH 1.66 10/14/2023   Lab Results  Component Value Date   WBC 7.5 10/14/2023   HGB 13.1 10/14/2023   HCT 40.4 10/14/2023   MCV 88.4 10/14/2023   PLT 334.0 10/14/2023   Lab Results  Component Value Date   NA 136 10/14/2023   K 4.3 10/14/2023   CO2 27 10/14/2023   GLUCOSE 95 10/14/2023   BUN 13 10/14/2023   CREATININE 0.85 10/14/2023   BILITOT 0.6 10/14/2023   ALKPHOS 66 10/14/2023   AST 51 (H) 10/14/2023   ALT 78 (H) 10/14/2023   PROT 7.3 10/14/2023   ALBUMIN 4.6 10/14/2023   CALCIUM 9.4 10/14/2023   GFR 88.16 10/14/2023   Lab Results  Component Value Date   CHOL 237 (H) 10/14/2023   Lab Results  Component Value Date   HDL 66.30 10/14/2023   Lab Results  Component Value Date   LDLCALC 151 (H) 10/14/2023   Lab Results  Component Value Date   TRIG 98.0 10/14/2023   Lab Results  Component Value Date   CHOLHDL 4 10/14/2023   No results found for: HGBA1C     Assessment & Plan:  Mixed hyperlipidemia Assessment & Plan: Maintain heart healthy diet such as MIND or DASH diet, increase exercise, avoid trans fats, simple carbohydrates and processed foods, consider a krill or fish  or flaxseed oil cap daily. Continue to monitor lipid panels   Obesity, unspecified class, unspecified obesity type, unspecified whether serious comorbidity present Assessment & Plan: maintain DASH or MIND diet, decrease po intake and increase exercise as tolerated. Needs 7-8 hours of sleep nightly. Avoid trans fats, eat small, frequent meals every 4-5 hours with lean proteins, complex carbs and healthy fats. Minimize simple carbs, high fat foods and processed foods. Consider PCOS with a history of  heavy and irregular periods until placed on OCP. Is married and considering pregnancy. Started on Metformin  trial and asked to start a Multivitamin with iron and get at least 1 mg of Folic Acid daily   Attention deficit hyperactivity disorder (ADHD), unspecified ADHD type Assessment & Plan: Refills given on Adderall XR which has been working well   Seasonal affective disorder Assessment & Plan: Has been staying on meds year round      Assessment and Plan Assessment & Plan      Harlene Horton, MD

## 2024-08-15 NOTE — Assessment & Plan Note (Signed)
maintain DASH or MIND diet, decrease po intake and increase exercise as tolerated. Needs 7-8 hours of sleep nightly. Avoid trans fats, eat small, frequent meals every 4-5 hours with lean proteins, complex carbs and healthy fats. Minimize simple carbs, high fat foods and processed foods. Consider PCOS with a history of heavy and irregular periods until placed on OCP. Is married and considering pregnancy. Started on Metformin trial and asked to start a Multivitamin with iron and get at least 1 mg of Folic Acid daily

## 2024-08-15 NOTE — Assessment & Plan Note (Signed)
Refills given on Adderall XR which has been working well

## 2024-08-16 ENCOUNTER — Encounter: Payer: Self-pay | Admitting: Family Medicine

## 2024-08-16 ENCOUNTER — Ambulatory Visit: Payer: Self-pay | Admitting: Family Medicine

## 2024-08-16 ENCOUNTER — Ambulatory Visit: Admitting: Family Medicine

## 2024-08-16 VITALS — BP 130/80 | HR 88 | Temp 97.8°F | Resp 16 | Ht 64.0 in | Wt 227.6 lb

## 2024-08-16 DIAGNOSIS — R350 Frequency of micturition: Secondary | ICD-10-CM | POA: Diagnosis not present

## 2024-08-16 DIAGNOSIS — F338 Other recurrent depressive disorders: Secondary | ICD-10-CM | POA: Diagnosis not present

## 2024-08-16 DIAGNOSIS — E669 Obesity, unspecified: Secondary | ICD-10-CM | POA: Diagnosis not present

## 2024-08-16 DIAGNOSIS — D649 Anemia, unspecified: Secondary | ICD-10-CM | POA: Diagnosis not present

## 2024-08-16 DIAGNOSIS — N946 Dysmenorrhea, unspecified: Secondary | ICD-10-CM | POA: Diagnosis not present

## 2024-08-16 DIAGNOSIS — F909 Attention-deficit hyperactivity disorder, unspecified type: Secondary | ICD-10-CM

## 2024-08-16 DIAGNOSIS — E782 Mixed hyperlipidemia: Secondary | ICD-10-CM

## 2024-08-16 LAB — CBC WITH DIFFERENTIAL/PLATELET
Basophils Absolute: 0 K/uL (ref 0.0–0.1)
Basophils Relative: 0.5 % (ref 0.0–3.0)
Eosinophils Absolute: 0.1 K/uL (ref 0.0–0.7)
Eosinophils Relative: 1.7 % (ref 0.0–5.0)
HCT: 39.9 % (ref 36.0–46.0)
Hemoglobin: 13.3 g/dL (ref 12.0–15.0)
Lymphocytes Relative: 27.5 % (ref 12.0–46.0)
Lymphs Abs: 1.6 K/uL (ref 0.7–4.0)
MCHC: 33.3 g/dL (ref 30.0–36.0)
MCV: 87.7 fl (ref 78.0–100.0)
Monocytes Absolute: 0.4 K/uL (ref 0.1–1.0)
Monocytes Relative: 6.2 % (ref 3.0–12.0)
Neutro Abs: 3.7 K/uL (ref 1.4–7.7)
Neutrophils Relative %: 64.1 % (ref 43.0–77.0)
Platelets: 309 K/uL (ref 150.0–400.0)
RBC: 4.55 Mil/uL (ref 3.87–5.11)
RDW: 14.6 % (ref 11.5–15.5)
WBC: 5.8 K/uL (ref 4.0–10.5)

## 2024-08-16 LAB — POCT URINALYSIS DIPSTICK
Bilirubin, UA: NEGATIVE
Blood, UA: NEGATIVE
Glucose, UA: NEGATIVE
Ketones, UA: NEGATIVE
Leukocytes, UA: NEGATIVE
Nitrite, UA: NEGATIVE
Protein, UA: NEGATIVE
Spec Grav, UA: <=1.005 — AB (ref 1.010–1.025)
Urobilinogen, UA: 0.2 U/dL
pH, UA: 6.5 (ref 5.0–8.0)

## 2024-08-16 LAB — COMPREHENSIVE METABOLIC PANEL WITH GFR
ALT: 58 U/L — ABNORMAL HIGH (ref 0–35)
AST: 32 U/L (ref 0–37)
Albumin: 4.5 g/dL (ref 3.5–5.2)
Alkaline Phosphatase: 69 U/L (ref 39–117)
BUN: 10 mg/dL (ref 6–23)
CO2: 27 meq/L (ref 19–32)
Calcium: 9.3 mg/dL (ref 8.4–10.5)
Chloride: 102 meq/L (ref 96–112)
Creatinine, Ser: 0.78 mg/dL (ref 0.40–1.20)
GFR: 97.17 mL/min (ref >60.00)
Glucose, Bld: 81 mg/dL (ref 70–99)
Potassium: 4.5 meq/L (ref 3.5–5.1)
Sodium: 136 meq/L (ref 135–145)
Total Bilirubin: 0.5 mg/dL (ref 0.2–1.2)
Total Protein: 7.7 g/dL (ref 6.0–8.3)

## 2024-08-16 LAB — LIPID PANEL
Cholesterol: 216 mg/dL — ABNORMAL HIGH (ref 0–200)
HDL: 71.4 mg/dL (ref >39.00)
LDL Cholesterol: 129 mg/dL — ABNORMAL HIGH (ref 0–99)
NonHDL: 144.87
Total CHOL/HDL Ratio: 3
Triglycerides: 78 mg/dL (ref 0.0–149.0)
VLDL: 15.6 mg/dL (ref 0.0–40.0)

## 2024-08-16 LAB — TSH: TSH: 1.74 u[IU]/mL (ref 0.35–5.50)

## 2024-08-16 MED ORDER — FLUCONAZOLE 150 MG PO TABS: 150.0000 mg | ORAL_TABLET | ORAL | 3 refills | Status: AC

## 2024-08-16 MED ORDER — TIRZEPATIDE-WEIGHT MANAGEMENT 7.5 MG/0.5ML ~~LOC~~ SOLN: 7.5000 mg | SUBCUTANEOUS | 1 refills | Status: DC

## 2024-08-16 MED ORDER — MUPIROCIN 2 % EX OINT: 1.0000 | TOPICAL_OINTMENT | Freq: Three times a day (TID) | CUTANEOUS | 0 refills | Status: AC

## 2024-08-16 NOTE — Patient Instructions (Addendum)
 DMannose, cranberry and oregano oil  Tirzepatide  Injection (Weight Management) What is this medication? TIRZEPATIDE  (tir ZEP a tide) promotes weight loss. It may also be used to maintain weight loss.  It works by decreasing appetite. It can be used to treat sleep apnea. Changes to diet and exercise are often combined with this medication. This medicine may be used for other purposes; ask your health care provider or pharmacist if you have questions. COMMON BRAND NAME(S): Zepbound  What should I tell my care team before I take this medication? They need to know if you have any of these conditions: Diabetes Eye disease caused by diabetes Gallbladder disease Have or have had depression Have or have had pancreatitis Having surgery Kidney disease Personal or family history of MEN 2, a condition that causes endocrine gland tumors Personal or family history of thyroid  cancer Stomach or intestine problems, such as problems digesting food Suicidal thoughts, plans, or attempt An unusual or allergic reaction to tirzepatide , other medications, foods, dyes, or preservatives Pregnant or trying to get pregnant Breastfeeding How should I use this medication? This medication is injected under the skin. You will be taught how to prepare and give it. Take it as directed on the prescription label. Keep taking it unless your care team tells you to stop. It is important that you put your used needles and syringes in a special sharps container. Do not put them in a trash can. If you do not have a sharps container, call your pharmacist or care team to get one. A special MedGuide will be given to you by the pharmacist with each prescription and refill. Be sure to read this information carefully each time. This medication comes with INSTRUCTIONS FOR USE. Ask your pharmacist for directions on how to use this medication. Read the information carefully. Talk to your pharmacist or care team if you have questions. Talk  to your care team about the use of this medication in children. Special care may be needed. Overdosage: If you think you have taken too much of this medicine contact a poison control center or emergency room at once. NOTE: This medicine is only for you. Do not share this medicine with others. What if I miss a dose? If you miss a dose, take it as soon as you can unless it is more than 4 days (96 hours) late. If it is more than 4 days late, skip the missed dose. Take the next dose at the normal time. Do not take 2 doses within 3 days (72 hours) of each other. What may interact with this medication? Certain medications for diabetes, such as insulin, glyburide, glipizide This medication may affect how other medications work. Talk with your care team about all of the medications you take. They may suggest changes to your treatment plan to lower the risk of side effects and to make sure your medications work as intended. This list may not describe all possible interactions. Give your health care provider a list of all the medicines, herbs, non-prescription drugs, or dietary supplements you use. Also tell them if you smoke, drink alcohol, or use illegal drugs. Some items may interact with your medicine. What should I watch for while using this medication? Visit your care team for regular checks on your progress. Tell your care team if your condition does not start to get better or if it gets worse. Tell your care team if you are taking medication to treat diabetes, such as insulin or glipizide. This may increase your risk  of low blood sugar. Know the symptoms of low blood sugar and how to treat it. Talk to your care team about your risk of cancer. You may be more at risk for certain types of cancer if you take this medication. Talk to your care team right away if you have a lump or swelling in your neck, hoarseness that does not go away, trouble swallowing, shortness of breath, or trouble breathing. Make sure  you stay hydrated while taking this medication. Drink water often. Eat fruits and veggies that have a high water content. Drink more water when it is hot or you are active. Talk to your care team right away if you have fever, infection, vomiting, diarrhea, or if you sweat a lot while taking this medication. The loss of too much body fluid may make it dangerous for you to take this medication. If you are going to need surgery or a procedure, tell your care team that you are taking this medication. Estrogen and progestin hormones that you take by mouth may not work as well while you are taking this medication. Switch to a non-oral contraceptive or add a barrier contraceptive for 4 weeks after starting this medication and after each dose increase. Talk to your care team about contraceptive options. They can help you find the option that works for you. Do not take this medication without first talking to your care team if you may be or could become pregnant. Your care team can help you find the option that works for you. Weight loss is not recommended during pregnancy. Talk to your care team if you are breastfeeding. When recommended, this medication may be taken. Its use during breastfeeding has not been well studied. Your care team may suggest other options. What side effects may I notice from receiving this medication? Side effects that you should report to your care team as soon as possible: Allergic reactions--skin rash, itching, hives, swelling of the face, lips, tongue, or throat Change in vision Dehydration--increased thirst, dry mouth, feeling faint or lightheaded, headache, dark yellow or brown urine Fast or irregular heartbeat Gallbladder problems--severe stomach pain, nausea, vomiting, fever Kidney injury--decrease in the amount of urine, swelling of the ankles, hands, or feet Pancreatitis--severe stomach pain that spreads to your back or gets worse after eating or when touched, fever, nausea,  vomiting Thoughts of suicide or self-harm, worsening mood, feelings of depression Thyroid  cancer--new mass or lump in the neck, pain or trouble swallowing, trouble breathing, hoarseness Side effects that usually do not require medical attention (report these to your care team if they continue or are bothersome): Constipation Diarrhea Loss of appetite Nausea Upset stomach This list may not describe all possible side effects. Call your doctor for medical advice about side effects. You may report side effects to FDA at 1-800-FDA-1088. Where should I keep my medication? Keep out of the reach of children and pets. Store in a refrigerator or at room temperature up to 30 degrees C (86 degrees F). Keep it in the original container. Protect from light. Refrigeration (preferred): Store in the refrigerator. Do not freeze. Get rid of any unused medication after the expiration date. Room temperature: This medication may be stored at room temperature for up to 21 days. If it is stored at room temperature, get rid of any unused medication after 21 days or after it expires, whichever is first. To get rid of medications that are no longer needed or have expired: Take the medication to a medication take-back program. Check  with your pharmacy or law enforcement to find a location. If you cannot return the medication, ask your pharmacist or care team how to get rid of this medication safely. NOTE: This sheet is a summary. It may not cover all possible information. If you have questions about this medicine, talk to your doctor, pharmacist, or health care provider.  2025 Elsevier/Gold Standard (2023-10-28 00:00:00)

## 2024-08-16 NOTE — Assessment & Plan Note (Signed)
Check urinalysis and urine culture 

## 2024-08-17 LAB — URINE CULTURE
MICRO NUMBER:: 17181786
Result:: NO GROWTH
SPECIMEN QUALITY:: ADEQUATE

## 2024-08-17 NOTE — Progress Notes (Signed)
 Patient reviewed via MyChart.

## 2024-09-08 ENCOUNTER — Other Ambulatory Visit: Payer: Self-pay

## 2024-09-08 ENCOUNTER — Other Ambulatory Visit (HOSPITAL_BASED_OUTPATIENT_CLINIC_OR_DEPARTMENT_OTHER): Payer: Self-pay

## 2024-09-08 ENCOUNTER — Encounter: Payer: Self-pay | Admitting: Family Medicine

## 2024-09-08 MED ORDER — ZEPBOUND 7.5 MG/0.5ML ~~LOC~~ SOAJ
7.5000 mg | SUBCUTANEOUS | 1 refills | Status: DC
  Filled 2024-09-08: qty 2, 28d supply, fill #0

## 2024-09-08 MED ORDER — AMPHETAMINE-DEXTROAMPHET ER 30 MG PO CP24
30.0000 mg | ORAL_CAPSULE | Freq: Every day | ORAL | 0 refills | Status: DC
  Filled 2024-09-08 (×2): qty 30, 30d supply, fill #0

## 2024-10-01 ENCOUNTER — Encounter: Payer: Self-pay | Admitting: Family Medicine

## 2024-10-03 ENCOUNTER — Other Ambulatory Visit: Payer: Self-pay | Admitting: Family Medicine

## 2024-10-03 ENCOUNTER — Other Ambulatory Visit (HOSPITAL_BASED_OUTPATIENT_CLINIC_OR_DEPARTMENT_OTHER): Payer: Self-pay

## 2024-10-03 MED ORDER — ZEPBOUND 10 MG/0.5ML ~~LOC~~ SOAJ
10.0000 mg | SUBCUTANEOUS | 1 refills | Status: DC
  Filled 2024-10-03: qty 2, 28d supply, fill #0

## 2024-10-04 ENCOUNTER — Other Ambulatory Visit (HOSPITAL_BASED_OUTPATIENT_CLINIC_OR_DEPARTMENT_OTHER): Payer: Self-pay

## 2024-10-06 ENCOUNTER — Other Ambulatory Visit: Payer: Self-pay | Admitting: Family

## 2024-10-06 NOTE — Telephone Encounter (Signed)
 Requesting: Adderall XR 30mg   Contract:10/14/23 UDS:10/14/23 Last Visit: 08/16/24 Next Visit: 12/06/24 Last Refill: 09/08/24 #30 and 0RF   Please Advise

## 2024-10-08 ENCOUNTER — Other Ambulatory Visit: Payer: Self-pay

## 2024-10-08 ENCOUNTER — Other Ambulatory Visit (HOSPITAL_BASED_OUTPATIENT_CLINIC_OR_DEPARTMENT_OTHER): Payer: Self-pay

## 2024-10-08 MED ORDER — AMPHETAMINE-DEXTROAMPHET ER 30 MG PO CP24
30.0000 mg | ORAL_CAPSULE | Freq: Every day | ORAL | 0 refills | Status: DC
  Filled 2024-10-08: qty 30, 30d supply, fill #0

## 2024-11-01 ENCOUNTER — Encounter: Payer: Self-pay | Admitting: Family Medicine

## 2024-11-02 ENCOUNTER — Other Ambulatory Visit: Payer: Self-pay | Admitting: Family

## 2024-11-02 ENCOUNTER — Other Ambulatory Visit (HOSPITAL_BASED_OUTPATIENT_CLINIC_OR_DEPARTMENT_OTHER): Payer: Self-pay

## 2024-11-02 ENCOUNTER — Encounter (HOSPITAL_COMMUNITY): Payer: Self-pay | Admitting: Pharmacist

## 2024-11-02 MED ORDER — ZEPBOUND 12.5 MG/0.5ML ~~LOC~~ SOAJ
12.5000 mg | SUBCUTANEOUS | 2 refills | Status: DC
  Filled 2024-11-02: qty 2, 28d supply, fill #0

## 2024-11-03 ENCOUNTER — Telehealth (HOSPITAL_BASED_OUTPATIENT_CLINIC_OR_DEPARTMENT_OTHER): Payer: Self-pay

## 2024-11-03 ENCOUNTER — Other Ambulatory Visit (HOSPITAL_BASED_OUTPATIENT_CLINIC_OR_DEPARTMENT_OTHER): Payer: Self-pay

## 2024-11-03 NOTE — Telephone Encounter (Signed)
 Pharmacy Patient Advocate Encounter  Received notification from [employer] that Prior Authorization for  Zepbound  12.5MG /0.5ML pen-injectors  has been APPROVED from 11/03/24 to 11/03/25. Ran test claim, Copay is $24.99. This test claim was processed through [facility]- copay amounts may vary at other pharmacies due to pharmacy/plan contracts, or as the patient moves through the different stages of their insurance plan.   PA #/Case ID/Reference #: [id number]

## 2024-11-03 NOTE — Telephone Encounter (Signed)
 Pharmacy Patient Advocate Encounter   Received notification from Pt Calls Messages that prior authorization for Zepbound  12.5MG /0.5ML pen-injectors  is required/requested.   Insurance verification completed.   The patient is insured through [facility].   Per test claim: PA required; PA submitted to above mentioned insurance via Latent Key/confirmation #/EOC [id number] Status is pending

## 2024-11-06 ENCOUNTER — Encounter: Payer: Self-pay | Admitting: Family Medicine

## 2024-11-07 ENCOUNTER — Other Ambulatory Visit: Payer: Self-pay | Admitting: Family

## 2024-11-08 ENCOUNTER — Other Ambulatory Visit: Payer: Self-pay | Admitting: Family Medicine

## 2024-11-08 ENCOUNTER — Other Ambulatory Visit: Payer: Self-pay | Admitting: Family

## 2024-11-08 ENCOUNTER — Encounter: Payer: Self-pay | Admitting: Family Medicine

## 2024-11-08 ENCOUNTER — Other Ambulatory Visit (HOSPITAL_BASED_OUTPATIENT_CLINIC_OR_DEPARTMENT_OTHER): Payer: Self-pay

## 2024-11-08 MED ORDER — AMPHETAMINE-DEXTROAMPHET ER 30 MG PO CP24: 30.0000 mg | ORAL_CAPSULE | Freq: Every day | ORAL | 0 refills | Status: AC

## 2024-11-08 MED ORDER — ZEPBOUND 12.5 MG/0.5ML ~~LOC~~ SOAJ: 12.5000 mg | SUBCUTANEOUS | 2 refills | Status: DC

## 2024-11-08 MED ORDER — AMPHETAMINE-DEXTROAMPHET ER 30 MG PO CP24
30.0000 mg | ORAL_CAPSULE | Freq: Every day | ORAL | 0 refills | Status: AC
  Filled 2024-11-08: qty 30, 30d supply, fill #0

## 2024-11-08 MED ORDER — AMPHETAMINE-DEXTROAMPHET ER 30 MG PO CP24
30.0000 mg | ORAL_CAPSULE | ORAL | 0 refills | Status: AC
  Filled 2024-11-08: qty 30, 30d supply, fill #0

## 2024-11-08 MED ORDER — AMPHETAMINE-DEXTROAMPHET ER 30 MG PO CP24
30.0000 mg | ORAL_CAPSULE | Freq: Every day | ORAL | 0 refills | Status: DC
  Filled 2024-11-08: qty 30, 30d supply, fill #0

## 2024-11-08 NOTE — Telephone Encounter (Signed)
 Requesting: Adderall XR 30mg  Contract: 10/29/23 UDS: 10/14/23 Last Visit: 08/16/24 Next Visit:  12/06/24 Last Refill: 10/08/24 #30 and 0RF  Please Advise

## 2024-11-08 NOTE — Telephone Encounter (Signed)
 Requesting: Adderall XR 30mg   Contract: 10/14/23 UDS: 10/14/23 Last Visit: 08/16/24 Next Visit: 12/06/24 Last Refill: 10/08/24 #30 and 0RF   Please Advise

## 2024-11-08 NOTE — Telephone Encounter (Signed)
 Called and cancelled Zepbound .

## 2024-11-09 ENCOUNTER — Other Ambulatory Visit (HOSPITAL_BASED_OUTPATIENT_CLINIC_OR_DEPARTMENT_OTHER): Payer: Self-pay

## 2024-11-09 ENCOUNTER — Telehealth: Payer: Self-pay

## 2024-11-09 MED ORDER — ZEPBOUND 12.5 MG/0.5ML ~~LOC~~ SOAJ
12.5000 mg | SUBCUTANEOUS | 2 refills | Status: AC
  Filled 2024-11-09: qty 2, 28d supply, fill #0

## 2024-11-09 NOTE — Telephone Encounter (Signed)
 Zepbound  send to wrong pharmacy.

## 2024-11-19 ENCOUNTER — Other Ambulatory Visit: Payer: Self-pay | Admitting: Family Medicine

## 2024-12-06 ENCOUNTER — Encounter: Admitting: Family Medicine
